# Patient Record
Sex: Female | Born: 2000 | Race: Black or African American | Hispanic: No | Marital: Single | Smoking: Never smoker
Health system: Southern US, Community
[De-identification: ages and names within clinical notes are randomized; demographics above are authoritative.]

## PROBLEM LIST (undated history)

## (undated) ENCOUNTER — Inpatient Hospital Stay: Payer: Self-pay

## (undated) DIAGNOSIS — Z9289 Personal history of other medical treatment: Secondary | ICD-10-CM

## (undated) DIAGNOSIS — Z9109 Other allergy status, other than to drugs and biological substances: Secondary | ICD-10-CM

## (undated) DIAGNOSIS — J45909 Unspecified asthma, uncomplicated: Secondary | ICD-10-CM

## (undated) DIAGNOSIS — B009 Herpesviral infection, unspecified: Secondary | ICD-10-CM

## (undated) DIAGNOSIS — D649 Anemia, unspecified: Secondary | ICD-10-CM

## (undated) DIAGNOSIS — L309 Dermatitis, unspecified: Secondary | ICD-10-CM

## (undated) HISTORY — DX: Personal history of other medical treatment: Z92.89

## (undated) HISTORY — DX: Dermatitis, unspecified: L30.9

## (undated) HISTORY — DX: Other allergy status, other than to drugs and biological substances: Z91.09

## (undated) HISTORY — PX: WISDOM TOOTH EXTRACTION: SHX21

## (undated) HISTORY — DX: Herpesviral infection, unspecified: B00.9

## (undated) HISTORY — DX: Unspecified asthma, uncomplicated: J45.909

---
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
## 2019-08-04 ENCOUNTER — Other Ambulatory Visit: Payer: Self-pay | Admitting: Obstetrics and Gynecology

## 2019-08-04 DIAGNOSIS — Z30011 Encounter for initial prescription of contraceptive pills: Secondary | ICD-10-CM

## 2019-10-11 NOTE — L&D Delivery Note (Signed)
Delivery Summary for Sandy Burns  Labor Events:   Preterm labor: No data found  Rupture date: 09/05/2020  Rupture time: 5:07 PM  Rupture type: Spontaneous Intact  Fluid Color: Clear  Induction: No data found  Augmentation: No data found  Complications: No data found  Cervical ripening: No data found No data found   No data found     Delivery:   Episiotomy: No data found  Lacerations: No data found  Repair suture: No data found  Repair # of packets: No data found  Blood loss (ml): 500   Information for the patient's newborn:  Anacristina, Steffek [[id number]]    Delivery 09/05/2020 6:10 PM by  Vaginal, Spontaneous Sex:  female Gestational Age: [redacted]w[redacted]d Delivery Clinician:   Living?:         APGARS  One minute Five minutes Ten minutes  Skin color:        Heart rate:        Grimace:        Muscle tone:        Breathing:        Totals: 9  9      Presentation/position:      Resuscitation:   Cord information:    Disposition of cord blood:     Blood gases sent?  Complications:   Placenta: Delivered:       appearance Newborn Measurements: Weight: 6 lb 8.8 oz (2970 g)  Height: 20.08"  Head circumference:    Chest circumference:    Other providers:    Additional  information: Forceps:   Vacuum:   Breech:   Observed anomalies      Delivery Note At 6:10 PM a viable and healthy female was delivered via Vaginal, Spontaneous (Presentation: Vertex; Left Occiput Anterior).  APGAR: 9, 9; weight 2970 grams.   Placenta status: Spontaneous, Intact.  Cord: 3 vessels with the following complications: None.  Cord pH: not obtained.  Anesthesia: Epidural;Local 1% lidocaine - 6 ml Episiotomy: None Lacerations: Vaginal Suture Repair: 3.0 vicryl rapide  Est. Blood Loss (mL): 500  Mom to postpartum.  Baby to Couplet care / Skin to Skin.  Hildred Laser, MD 09/05/2020, 6:54 PM

## 2019-10-22 ENCOUNTER — Ambulatory Visit (INDEPENDENT_AMBULATORY_CARE_PROVIDER_SITE_OTHER): Payer: Medicaid Other | Admitting: Obstetrics and Gynecology

## 2019-10-22 ENCOUNTER — Other Ambulatory Visit: Payer: Self-pay

## 2019-10-22 ENCOUNTER — Other Ambulatory Visit (HOSPITAL_COMMUNITY)
Admission: RE | Admit: 2019-10-22 | Discharge: 2019-10-22 | Disposition: A | Discharge status: Home / Self Care | Payer: Medicaid Other | Source: Ambulatory Visit | Attending: Obstetrics and Gynecology | Admitting: Obstetrics and Gynecology

## 2019-10-22 ENCOUNTER — Encounter: Payer: Self-pay | Admitting: Obstetrics and Gynecology

## 2019-10-22 VITALS — BP 113/81 | HR 108 | Ht 67.0 in | Wt 233.6 lb

## 2019-10-22 DIAGNOSIS — Z30011 Encounter for initial prescription of contraceptive pills: Secondary | ICD-10-CM

## 2019-10-22 DIAGNOSIS — Z01419 Encounter for gynecological examination (general) (routine) without abnormal findings: Secondary | ICD-10-CM

## 2019-10-22 DIAGNOSIS — Z0000 Encounter for general adult medical examination without abnormal findings: Secondary | ICD-10-CM | POA: Diagnosis not present

## 2019-10-22 MED ORDER — DESOGESTREL-ETHINYL ESTRADIOL 0.15-0.02/0.01 MG (21/5) PO TABS: 1.0000 | ORAL_TABLET | Freq: Every day | ORAL | 1 refills | Status: DC

## 2019-10-22 NOTE — Addendum Note (Signed)
Addended by: Dorian Pod on: 10/22/2019 08:53 AM   Modules accepted: Orders

## 2019-10-22 NOTE — Progress Notes (Signed)
HPI:      Sandy Burns is a 19 y.o. No obstetric history on file. who LMP was Patient's last menstrual period was 10/11/2019.  Subjective:   She presents today for her annual examination.  She is taking OCPs.  She has normal regular cycles on OCPs and would like to continue them.  She has no complaints.    Hx: The following portions of the patient's history were reviewed and updated as appropriate:             She  has a past medical history of Asthma. She does not have a problem list on file. She  has a past surgical history that includes Foot surgery (2015) and Tooth extraction (2015). Her family history includes Hypertension in her mother; Lupus in her mother; Rheum arthritis in her mother. She  reports that she has never smoked. She has never used smokeless tobacco. She reports that she does not drink alcohol or use drugs. She has a current medication list which includes the following prescription(s): benzaclin, cetirizine, clobetasol, desogestrel-ethinyl estradiol, epipen 2-pak, fluticasone, ketoconazole, metadate cd, pataday, proair hfa, qvar, and tretinoin. She has No Known Allergies.       Review of Systems:  Review of Systems  Constitutional: Denied constitutional symptoms, night sweats, recent illness, fatigue, fever, insomnia and weight loss.  Eyes: Denied eye symptoms, eye pain, photophobia, vision change and visual disturbance.  Ears/Nose/Throat/Neck: Denied ear, nose, throat or neck symptoms, hearing loss, nasal discharge, sinus congestion and sore throat.  Cardiovascular: Denied cardiovascular symptoms, arrhythmia, chest pain/pressure, edema, exercise intolerance, orthopnea and palpitations.  Respiratory: Denied pulmonary symptoms, asthma, pleuritic pain, productive sputum, cough, dyspnea and wheezing.  Gastrointestinal: Denied, gastro-esophageal reflux, melena, nausea and vomiting.  Genitourinary: Denied genitourinary symptoms including symptomatic vaginal discharge,  pelvic relaxation issues, and urinary complaints.  Musculoskeletal: Denied musculoskeletal symptoms, stiffness, swelling, muscle weakness and myalgia.  Dermatologic: Denied dermatology symptoms, rash and scar.  Neurologic: Denied neurology symptoms, dizziness, headache, neck pain and syncope.  Psychiatric: Denied psychiatric symptoms, anxiety and depression.  Endocrine: Denied endocrine symptoms including hot flashes and night sweats.   Meds:   Current Outpatient Medications on File Prior to Visit  Medication Sig Dispense Refill  . BENZACLIN gel   0  . cetirizine (ZYRTEC) 10 MG tablet Take 10 mg by mouth daily.  0  . clobetasol (TEMOVATE) 0.05 % external solution   0  . EPIPEN 2-PAK 0.3 MG/0.3ML SOAJ injection use as directed by prescriber if needed  0  . fluticasone (FLONASE) 50 MCG/ACT nasal spray   0  . ketoconazole (NIZORAL) 2 % shampoo   0  . METADATE CD 20 MG CR capsule Take 20 mg by mouth every morning.  0  . PATADAY 0.2 % SOLN   0  . PROAIR HFA 108 (90 BASE) MCG/ACT inhaler inhale 1 to 2 puffs every 4 to 6 hours if needed for cough if needed  0  . QVAR 40 MCG/ACT inhaler   0  . tretinoin (RETIN-A) 0.025 % cream 1 APPLICATION APPLY ON THE SKIN AT BEDTIME: APPLY A PEA SIZED AMOUNT TO THE ENTIRE FACE  0   No current facility-administered medications on file prior to visit.    Objective:     Vitals:   10/22/19 0810  BP: 113/81  Pulse: (!) 108              Physical examination General NAD, Conversant  HEENT Atraumatic; Op clear with mmm.  Normo-cephalic. Pupils reactive. Anicteric sclerae  Thyroid/Neck Smooth without nodularity or enlargement. Normal ROM.  Neck Supple.  Skin No rashes, lesions or ulceration. Normal palpated skin turgor. No nodularity.  Breasts: No masses or discharge.  Symmetric.  No axillary adenopathy.  Lungs: Clear to auscultation.No rales or wheezes. Normal Respiratory effort, no retractions.  Heart: NSR.  No murmurs or rubs appreciated. No periferal  edema  Abdomen: Soft.  Non-tender.  No masses.  No HSM. No hernia  Extremities: Moves all appropriately.  Normal ROM for age. No lymphadenopathy.  Neuro: Oriented to PPT.  Normal mood. Normal affect.     Pelvic:   Vulva: Normal appearance.  No lesions.  Vagina: No lesions or abnormalities noted.  Support: Normal pelvic support.  Urethra No masses tenderness or scarring.  Meatus Normal size without lesions or prolapse.  Cervix: Normal appearance.  No lesions.  Anus: Normal exam.  No lesions.  Perineum: Normal exam.  No lesions.        Bimanual   Uterus: Normal size.  Non-tender.  Mobile.  AV.  Adnexae: No masses.  Non-tender to palpation.  Cul-de-sac: Negative for abnormality.      Assessment:    No obstetric history on file. There are no problems to display for this patient.    1. Well woman exam with routine gynecological exam   2. Initiation of OCP (BCP)     OCPs without problem.   Plan:            1.  Basic Screening Recommendations The basic screening recommendations for asymptomatic women were discussed with the patient during her visit.  The age-appropriate recommendations were discussed with her and the rational for the tests reviewed.  When I am informed by the patient that another primary care physician has previously obtained the age-appropriate tests and they are up-to-date, only outstanding tests are ordered and referrals given as necessary.  Abnormal results of tests will be discussed with her when all of her results are completed.  Routine preventative health maintenance measures emphasized: Exercise/Diet/Weight control, Tobacco Warnings, Alcohol/Substance use risks and Stress Management GC/CT performed  Continue OCPs Orders No orders of the defined types were placed in this encounter.    Meds ordered this encounter  Medications  . desogestrel-ethinyl estradiol (VIORELE) 0.15-0.02/0.01 MG (21/5) tablet    Sig: Take 1 tablet by mouth at bedtime.     Dispense:  84 tablet    Refill:  1        F/U  Return in about 1 year (around 10/21/2020) for Annual Physical.  Finis Bud, M.D. 10/22/2019 8:33 AM

## 2019-10-23 LAB — CERVICOVAGINAL ANCILLARY ONLY
Bacterial Vaginitis (gardnerella): NEGATIVE
Candida Glabrata: NEGATIVE
Candida Vaginitis: NEGATIVE
Chlamydia: POSITIVE — AB
Comment: NEGATIVE
Comment: NEGATIVE
Comment: NEGATIVE
Comment: NEGATIVE
Comment: NEGATIVE
Comment: NORMAL
Neisseria Gonorrhea: NEGATIVE
Trichomonas: NEGATIVE

## 2019-10-25 ENCOUNTER — Other Ambulatory Visit: Payer: Self-pay

## 2019-10-25 MED ORDER — AZITHROMYCIN 500 MG PO TABS: ORAL_TABLET | ORAL | 1 refills | Status: DC

## 2019-10-25 NOTE — Telephone Encounter (Signed)
Communicable disease report faxed to [facility] and fax confirmation received at 13:31.

## 2019-11-12 ENCOUNTER — Ambulatory Visit (INDEPENDENT_AMBULATORY_CARE_PROVIDER_SITE_OTHER): Payer: Medicaid Other | Admitting: Obstetrics and Gynecology

## 2019-11-12 ENCOUNTER — Encounter: Payer: Self-pay | Admitting: Obstetrics and Gynecology

## 2019-11-12 ENCOUNTER — Other Ambulatory Visit: Payer: Self-pay

## 2019-11-12 VITALS — BP 124/84 | HR 103 | Ht 67.0 in | Wt 228.0 lb

## 2019-11-12 DIAGNOSIS — Z202 Contact with and (suspected) exposure to infections with a predominantly sexual mode of transmission: Secondary | ICD-10-CM | POA: Diagnosis not present

## 2019-11-12 NOTE — Progress Notes (Signed)
Patient comes in today for STD recheck.

## 2019-11-12 NOTE — Progress Notes (Signed)
HPI:      [name] is a 19 y.o. No obstetric history on file. who LMP was Patient's last menstrual period was 11/07/2019.  Subjective:   She presents today after scheduling to be seen for a follow-up of a positive chlamydia.  Patient states that she took the Zithromax on the 17th and that her partner was treated on the same day.  She has not had intercourse since that time.  She reports no symptoms of vaginal discharge pelvic pain etc.    Hx: The following portions of the patient's history were reviewed and updated as appropriate:             She  has a past medical history of Asthma. She does not have a problem list on file. She  has a past surgical history that includes Foot surgery (2015) and Tooth extraction (2015). Her family history includes Hypertension in her mother; Lupus in her mother; Rheum arthritis in her mother. She  reports that she has never smoked. She has never used smokeless tobacco. She reports that she does not drink alcohol or use drugs. She has a current medication list which includes the following prescription(s): benzaclin, cetirizine, clobetasol, desogestrel-ethinyl estradiol, epipen 2-pak, fluticasone, ketoconazole, metadate cd, pataday, proair hfa, qvar, and tretinoin. She has No Known Allergies.       Review of Systems:  Review of Systems  Constitutional: Denied constitutional symptoms, night sweats, recent illness, fatigue, fever, insomnia and weight loss.  Eyes: Denied eye symptoms, eye pain, photophobia, vision change and visual disturbance.  Ears/Nose/Throat/Neck: Denied ear, nose, throat or neck symptoms, hearing loss, nasal discharge, sinus congestion and sore throat.  Cardiovascular: Denied cardiovascular symptoms, arrhythmia, chest pain/pressure, edema, exercise intolerance, orthopnea and palpitations.  Respiratory: Denied pulmonary symptoms, asthma, pleuritic pain, productive sputum, cough, dyspnea and wheezing.  Gastrointestinal: Denied,  gastro-esophageal reflux, melena, nausea and vomiting.  Genitourinary: Denied genitourinary symptoms including symptomatic vaginal discharge, pelvic relaxation issues, and urinary complaints.  Musculoskeletal: Denied musculoskeletal symptoms, stiffness, swelling, muscle weakness and myalgia.  Dermatologic: Denied dermatology symptoms, rash and scar.  Neurologic: Denied neurology symptoms, dizziness, headache, neck pain and syncope.  Psychiatric: Denied psychiatric symptoms, anxiety and depression.  Endocrine: Denied endocrine symptoms including hot flashes and night sweats.   Meds:   Current Outpatient Medications on File Prior to Visit  Medication Sig Dispense Refill  . BENZACLIN gel   0  . cetirizine (ZYRTEC) 10 MG tablet Take 10 mg by mouth daily.  0  . clobetasol (TEMOVATE) 0.05 % external solution   0  . desogestrel-ethinyl estradiol (VIORELE) 0.15-0.02/0.01 MG (21/5) tablet Take 1 tablet by mouth at bedtime. 84 tablet 1  . EPIPEN 2-PAK 0.3 MG/0.3ML SOAJ injection use as directed by prescriber if needed  0  . fluticasone (FLONASE) 50 MCG/ACT nasal spray   0  . ketoconazole (NIZORAL) 2 % shampoo   0  . METADATE CD 20 MG CR capsule Take 20 mg by mouth every morning.  0  . PATADAY 0.2 % SOLN   0  . PROAIR HFA 108 (90 BASE) MCG/ACT inhaler inhale 1 to 2 puffs every 4 to 6 hours if needed for cough if needed  0  . QVAR 40 MCG/ACT inhaler   0  . tretinoin (RETIN-A) 9.371 % cream 1 APPLICATION APPLY ON THE SKIN AT BEDTIME: APPLY A PEA SIZED AMOUNT TO THE ENTIRE FACE  0   No current facility-administered medications on file prior to visit.    Objective:  Vitals:   11/12/19 0949  BP: 124/84  Pulse: (!) 103                Assessment:    No obstetric history on file. There are no problems to display for this patient.    1. Chlamydia contact, treated     As patient not sexually active I am sure that her treatment on the 17th was effective.   Plan:            1.  I  recommended a test of cure approximately 6 weeks after treatment which would be the first week of March.  I have offered to test her again today if she desired but she declined as she has not had sexual contact since her last treatment.  2.  Patient taking OCPs as directed. Orders No orders of the defined types were placed in this encounter.   No orders of the defined types were placed in this encounter.     F/U  Return in about 1 month (around 12/10/2019). I spent 12 minutes involved in the care of this patient preparing to see the patient by obtaining and reviewing her medical history (including labs, imaging tests and prior procedures), documenting clinical information in the electronic health record (EHR), counseling and coordinating care plans, writing and sending prescriptions, ordering tests or procedures and directly communicating with the patient by discussing pertinent items from her history and physical exam as well as detailing my assessment and plan as noted above so that she has an informed understanding.  All of her questions were answered.  Elonda Husky, M.D. 11/12/2019 10:21 AM

## 2020-01-14 ENCOUNTER — Encounter: Payer: Self-pay | Admitting: Obstetrics and Gynecology

## 2020-01-14 ENCOUNTER — Other Ambulatory Visit: Payer: Self-pay

## 2020-01-14 ENCOUNTER — Ambulatory Visit (INDEPENDENT_AMBULATORY_CARE_PROVIDER_SITE_OTHER): Payer: Medicaid Other | Admitting: Obstetrics and Gynecology

## 2020-01-14 VITALS — BP 138/77 | HR 132 | Ht 67.0 in | Wt 236.0 lb

## 2020-01-14 DIAGNOSIS — A749 Chlamydial infection, unspecified: Secondary | ICD-10-CM | POA: Diagnosis not present

## 2020-01-14 DIAGNOSIS — N912 Amenorrhea, unspecified: Secondary | ICD-10-CM | POA: Diagnosis not present

## 2020-01-14 LAB — POCT URINE PREGNANCY: Preg Test, Ur: POSITIVE — AB

## 2020-01-14 NOTE — Progress Notes (Signed)
HPI:      [name] is a 19 y.o. G2P0010 who LMP was Patient's last menstrual period was 12/11/2019.  Subjective:   She presents today for follow-up test of cure for chlamydia.  She believes that her partner was treated and has been faithful to her but she is not sure. She "forgot" to take her OCPs and has now missed a menstrual period she has done a pregnancy test at home and it was positive. She is not yet taking prenatal vitamins. She is not sure if the father will be involved.  When I asked her she said "I blocked him on social media". Reports no problem with nausea vomiting or breast tenderness.    Hx: The following portions of the patient's history were reviewed and updated as appropriate:             She  has a past medical history of Asthma. She does not have a problem list on file. She  has a past surgical history that includes Foot surgery (2015) and Tooth extraction (2015). Her family history includes Hypertension in her mother; Lupus in her mother; Rheum arthritis in her mother. She  reports that she has never smoked. She has never used smokeless tobacco. She reports that she does not drink alcohol or use drugs. She has a current medication list which includes the following prescription(s): benzaclin, cetirizine, clobetasol, epipen 2-pak, fluticasone, ketoconazole, pataday, proair hfa, tretinoin, advair diskus, desogestrel-ethinyl estradiol, metadate cd, and qvar. She has No Known Allergies.       Review of Systems:  Review of Systems  Constitutional: Denied constitutional symptoms, night sweats, recent illness, fatigue, fever, insomnia and weight loss.  Eyes: Denied eye symptoms, eye pain, photophobia, vision change and visual disturbance.  Ears/Nose/Throat/Neck: Denied ear, nose, throat or neck symptoms, hearing loss, nasal discharge, sinus congestion and sore throat.  Cardiovascular: Denied cardiovascular symptoms, arrhythmia, chest pain/pressure, edema, exercise  intolerance, orthopnea and palpitations.  Respiratory: Denied pulmonary symptoms, asthma, pleuritic pain, productive sputum, cough, dyspnea and wheezing.  Gastrointestinal: Denied, gastro-esophageal reflux, melena, nausea and vomiting.  Genitourinary: Denied genitourinary symptoms including symptomatic vaginal discharge, pelvic relaxation issues, and urinary complaints.  Musculoskeletal: Denied musculoskeletal symptoms, stiffness, swelling, muscle weakness and myalgia.  Dermatologic: Denied dermatology symptoms, rash and scar.  Neurologic: Denied neurology symptoms, dizziness, headache, neck pain and syncope.  Psychiatric: Denied psychiatric symptoms, anxiety and depression.  Endocrine: Denied endocrine symptoms including hot flashes and night sweats.   Meds:   Current Outpatient Medications on File Prior to Visit  Medication Sig Dispense Refill  . BENZACLIN gel   0  . cetirizine (ZYRTEC) 10 MG tablet Take 10 mg by mouth daily.  0  . clobetasol (TEMOVATE) 0.05 % external solution   0  . EPIPEN 2-PAK 0.3 MG/0.3ML SOAJ injection use as directed by prescriber if needed  0  . fluticasone (FLONASE) 50 MCG/ACT nasal spray   0  . ketoconazole (NIZORAL) 2 % shampoo   0  . PATADAY 0.2 % SOLN   0  . PROAIR HFA 108 (90 BASE) MCG/ACT inhaler inhale 1 to 2 puffs every 4 to 6 hours if needed for cough if needed  0  . tretinoin (RETIN-A) 0.025 % cream 1 APPLICATION APPLY ON THE SKIN AT BEDTIME: APPLY A PEA SIZED AMOUNT TO THE ENTIRE FACE  0  . ADVAIR DISKUS 100-50 MCG/DOSE AEPB 1 puff 2 (two) times daily.    Marland Kitchen desogestrel-ethinyl estradiol (VIORELE) 0.15-0.02/0.01 MG (21/5) tablet Take 1 tablet by  mouth at bedtime. (Patient not taking: Reported on 01/14/2020) 84 tablet 1  . METADATE CD 20 MG CR capsule Take 20 mg by mouth every morning.  0  . QVAR 40 MCG/ACT inhaler   0   No current facility-administered medications on file prior to visit.    Objective:     Vitals:   01/14/20 0945  BP: 138/77   Pulse: (!) 132              Urinary pregnancy test positive  Assessment:    G2P0010 There are no problems to display for this patient.    1. Amenorrhea   2. Chlamydia     Estimated gestational age [redacted] weeks  Patient treated appropriate for chlamydia but she is unsure of her partner.   Plan:            Prenatal Plan 1.  The patient was given prenatal literature. 2.  She was started on prenatal vitamins. 3.  A prenatal lab panel was ordered or drawn. 4.  An ultrasound was ordered to better determine an EDC. 5.  A nurse visit was scheduled. 6.  Genetic testing and testing for other inheritable conditions discussed in detail. She will decide in the future whether to have these labs performed. 7.  A general overview of pregnancy testing, visit schedule, ultrasound schedule, and prenatal care was discussed. 8.  COVID and its risks associated with pregnancy, prevention by limiting exposure and use of masks, as well as the risks and benefits of vaccination during pregnancy were discussed in detail.  [facility] policy regarding office and hospital visitation and testing was explained. 9.  Benefits of breast-feeding discussed in detail including both maternal and infant benefits. Ready Set Baby website discussed. 10.  Urine sent for GC/CT test of cure.   Orders Orders Placed This Encounter  Procedures  . US OB Comp Less 14 Wks  . POCT urine pregnancy    No orders of the defined types were placed in this encounter.     F/U  Return in about 7 weeks (around 03/03/2020). I spent 31 minutes involved in the care of this patient preparing to see the patient by obtaining and reviewing her medical history (including labs, imaging tests and prior procedures), documenting clinical information in the electronic health record (EHR), counseling and coordinating care plans, writing and sending prescriptions, ordering tests or procedures and directly communicating with the patient by discussing pertinent  items from her history and physical exam as well as detailing my assessment and plan as noted above so that she has an informed understanding.  All of her questions were answered.  Finis Bud, M.D. 01/14/2020 10:18 AM

## 2020-01-24 ENCOUNTER — Other Ambulatory Visit: Payer: Self-pay | Admitting: Surgical

## 2020-01-24 MED ORDER — VITAFOL ULTRA 29-0.6-0.4-200 MG PO CAPS: 1.0000 | ORAL_CAPSULE | Freq: Every day | ORAL | 11 refills | Status: DC

## 2020-01-29 ENCOUNTER — Ambulatory Visit: Payer: Medicaid Other

## 2020-01-29 ENCOUNTER — Other Ambulatory Visit: Payer: Self-pay

## 2020-02-03 NOTE — Telephone Encounter (Signed)
pts mom called in and stated that her daughter got a message on mychart stated that she no showed for the appt on the 4/21. The pt was considered bc she didn't have to be charged a no show fee. I told the mom our no show process. I told the mom that she was seen and that was an error on [place]

## 2020-02-13 ENCOUNTER — Other Ambulatory Visit: Payer: Self-pay

## 2020-02-13 ENCOUNTER — Ambulatory Visit (INDEPENDENT_AMBULATORY_CARE_PROVIDER_SITE_OTHER): Payer: Medicaid Other

## 2020-02-13 DIAGNOSIS — Z3A09 9 weeks gestation of pregnancy: Secondary | ICD-10-CM

## 2020-02-13 DIAGNOSIS — N912 Amenorrhea, unspecified: Secondary | ICD-10-CM

## 2020-02-13 NOTE — Progress Notes (Signed)
      Sandy Burns presents for NOB nurse intake visit. Pregnancy confirmation done at [facility] , 01/14/2020, with Linzie Collin, MD.  G 1.  P 0.  LMP 12/11/19.  EDD 09/16/2020.  [place] [redacted]w[redacted]d. Pregnancy education material explained and given.  0 cats in the home.  NOB labs ordered. BMI greater than 30. TSH/HbgA1c ordered. Sickle cell order due to race. HIV and drug screen explained and ordered. Genetic screening discussed. Genetic testing; Unsure. Pt to discuss genetic testing with provider. PNV encouraged. Pt to follow up with provider in 3 weeks for NOB physical. FMLA and [facility] Financial Policy reviewed and signed by pt.

## 2020-02-14 ENCOUNTER — Ambulatory Visit (INDEPENDENT_AMBULATORY_CARE_PROVIDER_SITE_OTHER): Payer: Medicaid Other

## 2020-02-14 VITALS — BP 112/76 | HR 106 | Ht 67.0 in | Wt 238.6 lb

## 2020-02-14 DIAGNOSIS — R638 Other symptoms and signs concerning food and fluid intake: Secondary | ICD-10-CM

## 2020-02-14 DIAGNOSIS — Z3481 Encounter for supervision of other normal pregnancy, first trimester: Secondary | ICD-10-CM | POA: Diagnosis not present

## 2020-02-14 DIAGNOSIS — Z0283 Encounter for blood-alcohol and blood-drug test: Secondary | ICD-10-CM

## 2020-02-14 DIAGNOSIS — Z3A09 9 weeks gestation of pregnancy: Secondary | ICD-10-CM

## 2020-02-14 DIAGNOSIS — Z113 Encounter for screening for infections with a predominantly sexual mode of transmission: Secondary | ICD-10-CM

## 2020-02-14 LAB — OB RESULTS CONSOLE VARICELLA ZOSTER ANTIBODY, IGG: Varicella: NON-IMMUNE/NOT IMMUNE

## 2020-02-14 NOTE — Patient Instructions (Signed)
WHAT OB PATIENTS CAN EXPECT   Confirmation of pregnancy and ultrasound ordered if medically indicated-[redacted] weeks gestation  New OB (NOB) intake with nurse and New OB (NOB) labs- [redacted] weeks gestation  New OB (NOB) physical examination with provider- 11/[redacted] weeks gestation  Flu vaccine-[redacted] weeks gestation  Anatomy scan-[redacted] weeks gestation  Glucose tolerance test, blood work to test for anemia, T-dap vaccine-[redacted] weeks gestation  Vaginal swabs/cultures-STD/Group B strep-[redacted] weeks gestation  Appointments every 4 weeks until 28 weeks  Every 2 weeks from 28 weeks until 36 weeks  Weekly visits from 36 weeks until delivery  Morning Sickness  Morning sickness is when you feel sick to your stomach (nauseous) during pregnancy. You may feel sick to your stomach and throw up (vomit). You may feel sick in the morning, but you can feel this way at any time of day. Some women feel very sick to their stomach and cannot stop throwing up (hyperemesis gravidarum). Follow these instructions at home: Medicines  Take over-the-counter and prescription medicines only as told by your doctor. Do not take any medicines until you talk with your doctor about them first.  Taking multivitamins before getting pregnant can stop or lessen the harshness of morning sickness. Eating and drinking  Eat dry toast or crackers before getting out of bed.  Eat 5 or 6 small meals a day.  Eat dry and bland foods like rice and baked potatoes.  Do not eat greasy, fatty, or spicy foods.  Have someone cook for you if the smell of food causes you to feel sick or throw up.  If you feel sick to your stomach after taking prenatal vitamins, take them at night or with a snack.  Eat protein when you need a snack. Nuts, yogurt, and cheese are good choices.  Drink fluids throughout the day.  Try ginger ale made with real ginger, ginger tea made from fresh grated ginger, or ginger candies. General instructions  Do not use any products  that have nicotine or tobacco in them, such as cigarettes and e-cigarettes. If you need help quitting, ask your doctor.  Use an air purifier to keep the air in your house free of smells.  Get lots of fresh air.  Try to avoid smells that make you feel sick.  Try: ? Wearing a bracelet that is used for seasickness (acupressure wristband). ? Going to a doctor who puts thin needles into certain body points (acupuncture) to improve how you feel. Contact a doctor if:  You need medicine to feel better.  You feel dizzy or light-headed.  You are losing weight. Get help right away if:  You feel very sick to your stomach and cannot stop throwing up.  You pass out (faint).  You have very bad pain in your belly. Summary  Morning sickness is when you feel sick to your stomach (nauseous) during pregnancy.  You may feel sick in the morning, but you can feel this way at any time of day.  Making some changes to what you eat may help your symptoms go away. This information is not intended to replace advice given to you by your health care provider. Make sure you discuss any questions you have with your health care provider. Document Revised: 09/08/2017 Document Reviewed: 10/27/2016 Elsevier Patient Education  2020 [employer]. How a Baby Grows During Pregnancy  Pregnancy begins when a female's sperm enters a female's egg (fertilization). Fertilization usually happens in one of the tubes (fallopian tubes) that connect the ovaries to the  womb (uterus). The fertilized egg moves down the fallopian tube to the uterus. Once it reaches the uterus, it implants into the lining of the uterus and begins to grow. For the first 10 weeks, the fertilized egg is called an embryo. After 10 weeks, it is called a fetus. As the fetus continues to grow, it receives oxygen and nutrients through tissue (placenta) that grows to support the developing baby. The placenta is the life support system for the baby. It provides  oxygen and nutrition and removes waste. Learning as much as you can about your pregnancy and how your baby is developing can help you enjoy the experience. It can also make you aware of when there might be a problem and when to ask questions. How long does a typical pregnancy last? A pregnancy usually lasts 280 days, or about 40 weeks. Pregnancy is divided into three periods of growth, also called trimesters:  First trimester: 0-12 weeks.  Second trimester: 13-27 weeks.  Third trimester: 28-40 weeks. The day when your baby is ready to be born (full term) is your estimated date of delivery. How does my baby develop month by month? First month  The fertilized egg attaches to the inside of the uterus.  Some cells will form the placenta. Others will form the fetus.  The arms, legs, brain, spinal cord, lungs, and heart begin to develop.  At the end of the first month, the heart begins to beat. Second month  The bones, inner ear, eyelids, hands, and feet form.  The genitals develop.  By the end of 8 weeks, all major organs are developing. Third month  All of the internal organs are forming.  Teeth develop below the gums.  Bones and muscles begin to grow. The spine can flex.  The skin is transparent.  Fingernails and toenails begin to form.  Arms and legs continue to grow longer, and hands and feet develop.  The fetus is about 3 inches (7.6 cm) long. Fourth month  The placenta is completely formed.  The external sex organs, neck, outer ear, eyebrows, eyelids, and fingernails are formed.  The fetus can hear, swallow, and move its arms and legs.  The kidneys begin to produce urine.  The skin is covered with a white, waxy coating (vernix) and very fine hair (lanugo). Fifth month  The fetus moves around more and can be felt for the first time (quickening).  The fetus starts to sleep and wake up and may begin to suck its finger.  The nails grow to the end of the  fingers.  The organ in the digestive system that makes bile (gallbladder) functions and helps to digest nutrients.  If your baby is a girl, eggs are present in her ovaries. If your baby is a boy, testicles start to move down into his scrotum. Sixth month  The lungs are formed.  The eyes open. The brain continues to develop.  Your baby has fingerprints and toe prints. Your baby's hair grows thicker.  At the end of the second trimester, the fetus is about 9 inches (22.9 cm) long. Seventh month  The fetus kicks and stretches.  The eyes are developed enough to sense changes in light.  The hands can make a grasping motion.  The fetus responds to sound. Eighth month  All organs and body systems are fully developed and functioning.  Bones harden, and taste buds develop. The fetus may hiccup.  Certain areas of the brain are still developing. The skull remains soft.   Ninth month  The fetus gains about  lb (0.23 kg) each week.  The lungs are fully developed.  Patterns of sleep develop.  The fetus's head typically moves into a head-down position (vertex) in the uterus to prepare for birth.  The fetus weighs 6-9 lb (2.72-4.08 kg) and is 19-20 inches (48.26-50.8 cm) long. What can I do to have a healthy pregnancy and help my baby develop? General instructions  Take prenatal vitamins as directed by your health care provider. These include vitamins such as folic acid, iron, calcium, and vitamin D. They are important for healthy development.  Take medicines only as directed by your health care provider. Read labels and ask a pharmacist or your health care provider whether over-the-counter medicines, supplements, and prescription drugs are safe to take during pregnancy.  Keep all follow-up visits as directed by your health care provider. This is important. Follow-up visits include prenatal care and screening tests. How do I know if my baby is developing well? At each prenatal visit,  your health care provider will do several different tests to check on your health and keep track of your baby's development. These include:  Fundal height and position. ? Your health care provider will measure your growing belly from your pubic bone to the top of the uterus using a tape measure. ? Your health care provider will also feel your belly to determine your baby's position.  Heartbeat. ? An ultrasound in the first trimester can confirm pregnancy and show a heartbeat, depending on how far along you are. ? Your health care provider will check your baby's heart rate at every prenatal visit.  Second trimester ultrasound. ? This ultrasound checks your baby's development. It also may show your baby's gender. What should I do if I have concerns about my baby's development? Always talk with your health care provider about any concerns that you may have about your pregnancy and your baby. Summary  A pregnancy usually lasts 280 days, or about 40 weeks. Pregnancy is divided into three periods of growth, also called trimesters.  Your health care provider will monitor your baby's growth and development throughout your pregnancy.  Follow your health care provider's recommendations about taking prenatal vitamins and medicines during your pregnancy.  Talk with your health care provider if you have any concerns about your pregnancy or your developing baby. This information is not intended to replace advice given to you by your health care provider. Make sure you discuss any questions you have with your health care provider. Document Revised: 01/17/2019 Document Reviewed: 08/09/2017 Elsevier Patient Education  2020 [place] of Pregnancy  The first trimester of pregnancy is from week 1 until the end of week 13 (months 1 through 3). During this time, your baby will begin to develop inside you. At 6-8 weeks, the eyes and face are formed, and the heartbeat can be seen on  ultrasound. At the end of 12 weeks, all the baby's organs are formed. Prenatal care is all the medical care you receive before the birth of your baby. Make sure you get good prenatal care and follow all of your doctor's instructions. Follow these instructions at home: Medicines  Take over-the-counter and prescription medicines only as told by your doctor. Some medicines are safe and some medicines are not safe during pregnancy.  Take a prenatal vitamin that contains at least 600 micrograms (mcg) of folic acid.  If you have trouble pooping (constipation), take medicine that will make your stool soft (stool  softener) if your doctor approves. Eating and drinking   Eat regular, healthy meals.  Your doctor will tell you the amount of weight gain that is right for you.  Avoid raw meat and uncooked cheese.  If you feel sick to your stomach (nauseous) or throw up (vomit): ? Eat 4 or 5 small meals a day instead of 3 large meals. ? Try eating a few soda crackers. ? Drink liquids between meals instead of during meals.  To prevent constipation: ? Eat foods that are high in fiber, like fresh fruits and vegetables, whole grains, and beans. ? Drink enough fluids to keep your pee (urine) clear or pale yellow. Activity  Exercise only as told by your doctor. Stop exercising if you have cramps or pain in your lower belly (abdomen) or low back.  Do not exercise if it is too hot, too humid, or if you are in a place of great height (high altitude).  Try to avoid standing for long periods of time. Move your legs often if you must stand in one place for a long time.  Avoid heavy lifting.  Wear low-heeled shoes. Sit and stand up straight.  You can have sex unless your doctor tells you not to. Relieving pain and discomfort  Wear a good support bra if your breasts are sore.  Take warm water baths (sitz baths) to soothe pain or discomfort caused by hemorrhoids. Use hemorrhoid cream if your doctor says  it is okay.  Rest with your legs raised if you have leg cramps or low back pain.  If you have puffy, bulging veins (varicose veins) in your legs: ? Wear support hose or compression stockings as told by your doctor. ? Raise (elevate) your feet for 15 minutes, 3-4 times a day. ? Limit salt in your food. Prenatal care  Schedule your prenatal visits by the twelfth week of pregnancy.  Write down your questions. Take them to your prenatal visits.  Keep all your prenatal visits as told by your doctor. This is important. Safety  Wear your seat belt at all times when driving.  Make a list of emergency phone numbers. The list should include numbers for family, friends, the hospital, and police and fire departments. General instructions  Ask your doctor for a referral to a local prenatal class. Begin classes no later than at the start of month 6 of your pregnancy.  Ask for help if you need counseling or if you need help with nutrition. Your doctor can give you advice or tell you where to go for help.  Do not use hot tubs, steam rooms, or saunas.  Do not douche or use tampons or scented sanitary pads.  Do not cross your legs for long periods of time.  Avoid all herbs and alcohol. Avoid drugs that are not approved by your doctor.  Do not use any tobacco products, including cigarettes, chewing tobacco, and electronic cigarettes. If you need help quitting, ask your doctor. You may get counseling or other support to help you quit.  Avoid cat litter boxes and soil used by cats. These carry germs that can cause birth defects in the baby and can cause a loss of your baby (miscarriage) or stillbirth.  Visit your dentist. At home, brush your teeth with a soft toothbrush. Be gentle when you floss. Contact a doctor if:  You are dizzy.  You have mild cramps or pressure in your lower belly.  You have a nagging pain in your belly area.  You  continue to feel sick to your stomach, you throw up, or  you have watery poop (diarrhea).  You have a bad smelling fluid coming from your vagina.  You have pain when you pee (urinate).  You have increased puffiness (swelling) in your face, hands, legs, or ankles. Get help right away if:  You have a fever.  You are leaking fluid from your vagina.  You have spotting or bleeding from your vagina.  You have very bad belly cramping or pain.  You gain or lose weight rapidly.  You throw up blood. It may look like coffee grounds.  You are around people who have [place] measles, fifth disease, or chickenpox.  You have a very bad headache.  You have shortness of breath.  You have any kind of trauma, such as from a fall or a car accident. Summary  The first trimester of pregnancy is from week 1 until the end of week 13 (months 1 through 3).  To take care of yourself and your unborn baby, you will need to eat healthy meals, take medicines only if your doctor tells you to do so, and do activities that are safe for you and your baby.  Keep all follow-up visits as told by your doctor. This is important as your doctor will have to ensure that your baby is healthy and growing well. This information is not intended to replace advice given to you by your health care provider. Make sure you discuss any questions you have with your health care provider. Document Revised: 01/17/2019 Document Reviewed: 10/04/2016 Elsevier Patient Education  2020 [employer]. Commonly Asked Questions During Pregnancy  Cats: A parasite can be excreted in cat feces.  To avoid exposure you need to have another person empty the little box.  If you must empty the litter box you will need to wear gloves.  Wash your hands after handling your cat.  This parasite can also be found in raw or undercooked meat so this should also be avoided.  Colds, Sore Throats, Flu: Please check your medication sheet to see what you can take for symptoms.  If your symptoms are unrelieved by these  medications please call the office.  Dental Work: Most any dental work [employer] recommends is permitted.  X-rays should only be taken during the first trimester if absolutely necessary.  Your abdomen should be shielded with a lead apron during all x-rays.  Please notify your provider prior to receiving any x-rays.  Novocaine is fine; gas is not recommended.  If your dentist requires a note from [place] prior to dental work please call the office and we will provide one for you.  Exercise: Exercise is an important part of staying healthy during your pregnancy.  You may continue most exercises you were accustomed to prior to pregnancy.  Later in your pregnancy you will most likely notice you have difficulty with activities requiring balance like riding a bicycle.  It is important that you listen to your body and avoid activities that put you at a higher risk of falling.  Adequate rest and staying well hydrated are a must!  If you have questions about the safety of specific activities ask your provider.    Exposure to Children with illness: Try to avoid obvious exposure; report any symptoms to [place] when noted,  If you have chicken pos, red measles or mumps, you should be immune to these diseases.   Please do not take any vaccines while pregnant unless you have checked  with your OB provider.  Fetal Movement: After 28 weeks we recommend you do "kick counts" twice daily.  Lie or sit down in a calm quiet environment and count your baby movements "kicks".  You should feel your baby at least 10 times per hour.  If you have not felt 10 kicks within the first hour get up, walk around and have something sweet to eat or drink then repeat for an additional hour.  If count remains less than 10 per hour notify your provider.  Fumigating: Follow your pest control agent's advice as to how long to stay out of your home.  Ventilate the area well before re-entering.  Hemorrhoids:   Most over-the-counter preparations can be used  during pregnancy.  Check your medication to see what is safe to use.  It is important to use a stool softener or fiber in your diet and to drink lots of liquids.  If hemorrhoids seem to be getting worse please call the office.   Hot Tubs:  Hot tubs Jacuzzis and saunas are not recommended while pregnant.  These increase your internal body temperature and should be avoided.  Intercourse:  Sexual intercourse is safe during pregnancy as long as you are comfortable, unless otherwise advised by your provider.  Spotting may occur after intercourse; report any bright red bleeding that is heavier than spotting.  Labor:  If you know that you are in labor, please go to the hospital.  If you are unsure, please call the office and let us help you decide what to do.  Lifting, straining, etc:  If your job requires heavy lifting or straining please check with your provider for any limitations.  Generally, you should not lift items heavier than that you can lift simply with your hands and arms (no back muscles)  Painting:  Paint fumes do not harm your pregnancy, but may make you ill and should be avoided if possible.  Latex or water based paints have less odor than oils.  Use adequate ventilation while painting.  Permanents & Hair Color:  Chemicals in hair dyes are not recommended as they cause increase hair dryness which can increase hair loss during pregnancy.  " Highlighting" and permanents are allowed.  Dye may be absorbed differently and permanents may not hold as well during pregnancy.  Sunbathing:  Use a sunscreen, as skin burns easily during pregnancy.  Drink plenty of fluids; avoid over heating.  Tanning Beds:  Because their possible side effects are still unknown, tanning beds are not recommended.  Ultrasound Scans:  Routine ultrasounds are performed at approximately 20 weeks.  You will be able to see your baby's general anatomy an if you would like to know the gender this can usually be determined as  well.  If it is questionable when you conceived you may also receive an ultrasound early in your pregnancy for dating purposes.  Otherwise ultrasound exams are not routinely performed unless there is a medical necessity.  Although you can request a scan we ask that you pay for it when conducted because insurance does not cover " patient request" scans.  Work: If your pregnancy proceeds without complications you may work until your due date, unless your physician or employer advises otherwise.  Round Ligament Pain/Pelvic Discomfort:  Sharp, shooting pains not associated with bleeding are fairly common, usually occurring in the second trimester of pregnancy.  They tend to be worse when standing up or when you remain standing for long periods of time.  These are  the result of pressure of certain pelvic ligaments called "round ligaments".  Rest, Tylenol and heat seem to be the most effective relief.  As the womb and fetus grow, they rise out of the pelvis and the discomfort improves.  Please notify the office if your pain seems different than that described.  It may represent a more serious condition.  Common Medications Safe in Pregnancy  Acne:      Constipation:  Benzoyl Peroxide     Colace  Clindamycin      Dulcolax Suppository  Topica Erythromycin     Fibercon  Salicylic Acid      Metamucil         Miralax AVOID:        Senakot   Accutane    Cough:  Retin-A       Cough Drops  Tetracycline      Phenergan w/ Codeine if Rx  Minocycline      Robitussin (Plain & DM)  Antibiotics:     Crabs/Lice:  Ceclor       RID  Cephalosporins    AVOID:  E-Mycins      Kwell  Keflex  Macrobid/Macrodantin   Diarrhea:  Penicillin      Kao-Pectate  Zithromax      Imodium AD         PUSH FLUIDS AVOID:       Cipro     Fever:  Tetracycline      Tylenol (Regular or Extra  Minocycline       Strength)  Levaquin      Extra Strength-Do not          Exceed 8 tabs/24 hrs Caffeine:        <294m/day (equiv. To 1 cup  of coffee or  approx. 3 12 oz sodas)         Gas: Cold/Hayfever:       Gas-X  Benadryl      Mylicon  Claritin       Phazyme  **Claritin-D        Chlor-Trimeton    Headaches:  Dimetapp      ASA-Free Excedrin  Drixoral-Non-Drowsy     Cold Compress  Mucinex (Guaifenasin)     Tylenol (Regular or Extra  Sudafed/Sudafed-12 Hour     Strength)  **Sudafed PE Pseudoephedrine   Tylenol Cold & Sinus     Vicks Vapor Rub  Zyrtec  **AVOID if Problems With Blood Pressure         Heartburn: Avoid lying down for at least 1 hour after meals  Aciphex      Maalox     Rash:  Milk of Magnesia     Benadryl    Mylanta       1% Hydrocortisone Cream  Pepcid  Pepcid Complete   Sleep Aids:  Prevacid      Ambien   Prilosec       Benadryl  Rolaids       Chamomile Tea  Tums (Limit 4/day)     Unisom  Zantac       Tylenol PM         Warm milk-add vanilla or  Hemorrhoids:       Sugar for taste  Anusol/Anusol H.C.  (RX: Analapram 2.5%)  Sugar Substitutes:  Hydrocortisone OTC     Ok in moderation  Preparation H      Tucks        Vaseline lotion applied to tissue with wiping    Herpes:  Throat:  Acyclovir      Oragel  Famvir  Valtrex     Vaccines:         Flu Shot Leg Cramps:       *Gardasil  Benadryl      Hepatitis A         Hepatitis B Nasal Spray:       Pneumovax  Saline Nasal Spray     Polio Booster         Tetanus Nausea:       Tuberculosis test or PPD  Vitamin B6 25 mg TID   AVOID:    Dramamine      *Gardasil  Emetrol       Live Poliovirus  Ginger Root 250 mg QID    MMR (measles, mumps &  High Complex Carbs @ Bedtime    rebella)  Sea Bands-Accupressure    Varicella (Chickenpox)  Unisom 1/2 tab TID     *No known complications           If received before Pain:         Known pregnancy;   Darvocet       Resume series after  Lortab        Delivery  Percocet    Yeast:   Tramadol      Femstat  Tylenol  3      Gyne-lotrimin  Ultram       Monistat  Vicodin           MISC:         All Sunscreens           Hair Coloring/highlights          Insect Repellant's          (Including DEET)         Mystic Tans

## 2020-02-15 LAB — URINALYSIS, ROUTINE W REFLEX MICROSCOPIC
Bilirubin, UA: NEGATIVE
Glucose, UA: NEGATIVE
Ketones, UA: NEGATIVE
Leukocytes,UA: NEGATIVE
Nitrite, UA: NEGATIVE
Protein,UA: NEGATIVE
RBC, UA: NEGATIVE
Specific Gravity, UA: 1.02 (ref 1.005–1.030)
Urobilinogen, Ur: 0.2 mg/dL (ref 0.2–1.0)
pH, UA: 6.5 (ref 5.0–7.5)

## 2020-02-15 LAB — DRUG PROFILE, UR, 9 DRUGS (LABCORP)
Amphetamines, Urine: NEGATIVE ng/mL
Barbiturate Quant, Ur: NEGATIVE ng/mL
Benzodiazepine Quant, Ur: NEGATIVE ng/mL
Cannabinoid Quant, Ur: NEGATIVE ng/mL
Cocaine (Metab.): NEGATIVE ng/mL
Methadone Screen, Urine: NEGATIVE ng/mL
Opiate Quant, Ur: NEGATIVE ng/mL
PCP Quant, Ur: NEGATIVE ng/mL
Propoxyphene: NEGATIVE ng/mL

## 2020-02-15 LAB — NICOTINE SCREEN, URINE: Cotinine Ql Scrn, Ur: NEGATIVE ng/mL

## 2020-02-16 LAB — ABO AND RH: Rh Factor: POSITIVE

## 2020-02-16 LAB — TOXOPLASMA ANTIBODIES- IGG AND  IGM
Toxoplasma Antibody- IgM: <3 AU/mL (ref 0.0–7.9)
Toxoplasma IgG Ratio: <3 IU/mL (ref 0.0–7.1)

## 2020-02-16 LAB — HIV ANTIBODY (ROUTINE TESTING W REFLEX): HIV Screen 4th Generation wRfx: NONREACTIVE

## 2020-02-16 LAB — URINE CULTURE, OB REFLEX

## 2020-02-16 LAB — HGB SOLU + RFLX FRAC: Sickle Solubility Test - HGBRFX: NEGATIVE

## 2020-02-16 LAB — CULTURE, OB URINE

## 2020-02-16 LAB — RPR: RPR Ser Ql: NONREACTIVE

## 2020-02-16 LAB — HEPATITIS B SURFACE ANTIGEN: Hepatitis B Surface Ag: NEGATIVE

## 2020-02-16 LAB — TSH: TSH: 0.991 u[IU]/mL (ref 0.450–4.500)

## 2020-02-16 LAB — ANTIBODY SCREEN: Antibody Screen: NEGATIVE

## 2020-02-16 LAB — RUBELLA SCREEN: Rubella Antibodies, IGG: 2.07 index (ref >0.99)

## 2020-02-16 LAB — HEMOGLOBIN A1C
Est. average glucose Bld gHb Est-mCnc: 117 mg/dL
Hgb A1c MFr Bld: 5.7 % — ABNORMAL HIGH (ref 4.8–5.6)

## 2020-02-16 LAB — VARICELLA ZOSTER ANTIBODY, IGG: Varicella zoster IgG: <135 index — ABNORMAL LOW (ref >165)

## 2020-02-17 LAB — GC/CHLAMYDIA PROBE AMP
Chlamydia trachomatis, NAA: NEGATIVE
Neisseria Gonorrhoeae by PCR: NEGATIVE

## 2020-03-03 ENCOUNTER — Ambulatory Visit (INDEPENDENT_AMBULATORY_CARE_PROVIDER_SITE_OTHER): Payer: Medicaid Other | Admitting: Obstetrics and Gynecology

## 2020-03-03 ENCOUNTER — Encounter: Payer: Self-pay | Admitting: Obstetrics and Gynecology

## 2020-03-03 ENCOUNTER — Other Ambulatory Visit: Payer: Self-pay

## 2020-03-03 VITALS — BP 119/84 | HR 114 | Wt 233.8 lb

## 2020-03-03 DIAGNOSIS — Z3A11 11 weeks gestation of pregnancy: Secondary | ICD-10-CM

## 2020-03-03 DIAGNOSIS — Z3491 Encounter for supervision of normal pregnancy, unspecified, first trimester: Secondary | ICD-10-CM

## 2020-03-03 NOTE — Progress Notes (Signed)
NOB: No complaints.  Occasional nausea but without vomiting.  Occasional breast tenderness that "comes and goes."  Patient desires genetic testing.  Consider AFP next visit as well.  Early 1 hour GCT.  Physical examination General NAD, Conversant  HEENT Atraumatic; Op clear with mmm.  Normo-cephalic. Pupils reactive. Anicteric sclerae  Thyroid/Neck Smooth without nodularity or enlargement. Normal ROM.  Neck Supple.  Skin No rashes, lesions or ulceration. Normal palpated skin turgor. No nodularity.  Breasts: No masses or discharge.  Symmetric.  No axillary adenopathy.  Lungs: Clear to auscultation.No rales or wheezes. Normal Respiratory effort, no retractions.  Heart: NSR.  No murmurs or rubs appreciated. No periferal edema  Abdomen: Soft.  Non-tender.  No masses.  No HSM. No hernia  Extremities: Moves all appropriately.  Normal ROM for age. No lymphadenopathy.  Neuro: Oriented to PPT.  Normal mood. Normal affect.     Pelvic:   Vulva: Normal appearance.  No lesions.  Vagina: No lesions or abnormalities noted.  Support: Normal pelvic support.  Urethra No masses tenderness or scarring.  Meatus Normal size without lesions or prolapse.  Cervix: Normal appearance.  No lesions.  Anus: Normal exam.  No lesions.  Perineum: Normal exam.  No lesions.        Bimanual   Adnexae: No masses.  Non-tender to palpation.  Uterus: Enlarged. 12wks  Non-tender.  Mobile.  AV.  Adnexae: No masses.  Non-tender to palpation.  Cul-de-sac: Negative for abnormality.  Adnexae: No masses.  Non-tender to palpation.         Pelvimetry   Diagonal: Reached.  Spines: Average.  Sacrum: Concave.  Pubic Arch: Normal.

## 2020-03-07 LAB — MATERNIT21  PLUS CORE+ESS+SCA, BLOOD
11q23 deletion (Jacobsen): NOT DETECTED
15q11 deletion (PW Angelman): NOT DETECTED
1p36 deletion syndrome: NOT DETECTED
22q11 deletion (DiGeorge): NOT DETECTED
4p16 deletion(Wolf-Hirschhorn): NOT DETECTED
5p15 deletion (Cri-du-chat): NOT DETECTED
8q24 deletion (Langer-Giedion): NOT DETECTED
Fetal Fraction: 6
Monosomy X (Turner Syndrome): NOT DETECTED
Result (T21): NEGATIVE
Trisomy 13 (Patau syndrome): NEGATIVE
Trisomy 16: NOT DETECTED
Trisomy 18 (Edwards syndrome): NEGATIVE
Trisomy 21 (Down syndrome): NEGATIVE
Trisomy 22: NOT DETECTED
XXX (Triple X Syndrome): NOT DETECTED
XXY (Klinefelter Syndrome): NOT DETECTED
XYY (Jacobs Syndrome): NOT DETECTED

## 2020-03-31 ENCOUNTER — Other Ambulatory Visit: Payer: Self-pay

## 2020-03-31 ENCOUNTER — Ambulatory Visit (INDEPENDENT_AMBULATORY_CARE_PROVIDER_SITE_OTHER): Payer: Medicaid Other | Admitting: Obstetrics and Gynecology

## 2020-03-31 ENCOUNTER — Encounter: Payer: Self-pay | Admitting: Obstetrics and Gynecology

## 2020-03-31 VITALS — BP 113/78 | HR 112 | Wt 236.4 lb

## 2020-03-31 DIAGNOSIS — R638 Other symptoms and signs concerning food and fluid intake: Secondary | ICD-10-CM

## 2020-03-31 DIAGNOSIS — Z3A15 15 weeks gestation of pregnancy: Secondary | ICD-10-CM

## 2020-03-31 DIAGNOSIS — N949 Unspecified condition associated with female genital organs and menstrual cycle: Secondary | ICD-10-CM

## 2020-03-31 DIAGNOSIS — Z3402 Encounter for supervision of normal first pregnancy, second trimester: Secondary | ICD-10-CM

## 2020-03-31 LAB — POCT URINALYSIS DIPSTICK OB
Bilirubin, UA: NEGATIVE
Blood, UA: NEGATIVE
Glucose, UA: NEGATIVE
Ketones, UA: NEGATIVE
Leukocytes, UA: NEGATIVE
Nitrite, UA: NEGATIVE
POC,PROTEIN,UA: NEGATIVE
Spec Grav, UA: 1.025 (ref 1.010–1.025)
Urobilinogen, UA: 0.2 E.U./dL
pH, UA: 6 (ref 5.0–8.0)

## 2020-03-31 NOTE — Progress Notes (Signed)
ROB-Pt present for routine prenatal care. Pt stated having lower abd and pelvic pain. No other problems.

## 2020-03-31 NOTE — Patient Instructions (Addendum)
Second Trimester of Pregnancy The second trimester is from week 14 through week 27 (months 4 through 6). The second trimester is often a time when you feel your best. Your body has adjusted to being pregnant, and you begin to feel better physically. Usually, morning sickness has lessened or quit completely, you may have more energy, and you may have an increase in appetite. The second trimester is also a time when the fetus is growing rapidly. At the end of the sixth month, the fetus is about 9 inches long and weighs about 1 pounds. You will likely begin to feel the baby move (quickening) between 16 and 20 weeks of pregnancy. Body changes during your second trimester Your body continues to go through many changes during your second trimester. The changes vary from woman to woman.  Your weight will continue to increase. You will notice your lower abdomen bulging out.  You may begin to get stretch marks on your hips, abdomen, and breasts.  You may develop headaches that can be relieved by medicines. The medicines should be approved by your health care provider.  You may urinate more often because the fetus is pressing on your bladder.  You may develop or continue to have heartburn as a result of your pregnancy.  You may develop constipation because certain hormones are causing the muscles that push waste through your intestines to slow down.  You may develop hemorrhoids or swollen, bulging veins (varicose veins).  You may have back pain. This is caused by: ? Weight gain. ? Pregnancy hormones that are relaxing the joints in your pelvis. ? A shift in weight and the muscles that support your balance.  Your breasts will continue to grow and they will continue to become tender.  Your gums may bleed and may be sensitive to brushing and flossing.  Dark spots or blotches (chloasma, mask of pregnancy) may develop on your face. This will likely fade after the baby is born.  A dark line from your  belly button to the pubic area (linea nigra) may appear. This will likely fade after the baby is born.  You may have changes in your hair. These can include thickening of your hair, rapid growth, and changes in texture. Some women also have hair loss during or after pregnancy, or hair that feels dry or thin. Your hair will most likely return to normal after your baby is born. What to expect at prenatal visits During a routine prenatal visit:  You will be weighed to make sure you and the fetus are growing normally.  Your blood pressure will be taken.  Your abdomen will be measured to track your baby's growth.  The fetal heartbeat will be listened to.  Any test results from the previous visit will be discussed. Your health care provider may ask you:  How you are feeling.  If you are feeling the baby move.  If you have had any abnormal symptoms, such as leaking fluid, bleeding, severe headaches, or abdominal cramping.  If you are using any tobacco products, including cigarettes, chewing tobacco, and electronic cigarettes.  If you have any questions. Other tests that may be performed during your second trimester include:  Blood tests that check for: ? Low iron levels (anemia). ? High blood sugar that affects pregnant women (gestational diabetes) between 24 and 28 weeks. ? Rh antibodies. This is to check for a protein on red blood cells (Rh factor).  Urine tests to check for infections, diabetes, or protein in the   urine.  An ultrasound to confirm the proper growth and development of the baby.  An amniocentesis to check for possible genetic problems.  Fetal screens for spina bifida and Down syndrome.  HIV (human immunodeficiency virus) testing. Routine prenatal testing includes screening for HIV, unless you choose not to have this test. Follow these instructions at home: Medicines  Follow your health care provider's instructions regarding medicine use. Specific medicines may be  either safe or unsafe to take during pregnancy.  Take a prenatal vitamin that contains at least 600 micrograms (mcg) of folic acid.  If you develop constipation, try taking a stool softener if your health care provider approves. Eating and drinking   Eat a balanced diet that includes fresh fruits and vegetables, whole grains, good sources of protein such as meat, eggs, or tofu, and low-fat dairy. Your health care provider will help you determine the amount of weight gain that is right for you.  Avoid raw meat and uncooked cheese. These carry germs that can cause birth defects in the baby.  If you have low calcium intake from food, talk to your health care provider about whether you should take a daily calcium supplement.  Limit foods that are high in fat and processed sugars, such as fried and sweet foods.  To prevent constipation: ? Drink enough fluid to keep your urine clear or pale yellow. ? Eat foods that are high in fiber, such as fresh fruits and vegetables, whole grains, and beans. Activity  Exercise only as directed by your health care provider. Most women can continue their usual exercise routine during pregnancy. Try to exercise for 30 minutes at least 5 days a week. Stop exercising if you experience uterine contractions.  Avoid heavy lifting, wear low heel shoes, and practice good posture.  A sexual relationship may be continued unless your health care provider directs you otherwise. Relieving pain and discomfort  Wear a good support bra to prevent discomfort from breast tenderness.  Take warm sitz baths to soothe any pain or discomfort caused by hemorrhoids. Use hemorrhoid cream if your health care provider approves.  Rest with your legs elevated if you have leg cramps or low back pain.  If you develop varicose veins, wear support hose. Elevate your feet for 15 minutes, 3-4 times a day. Limit salt in your diet. Prenatal Care  Write down your questions. Take them to  your prenatal visits.  Keep all your prenatal visits as told by your health care provider. This is important. Safety  Wear your seat belt at all times when driving.  Make a list of emergency phone numbers, including numbers for family, friends, the hospital, and police and fire departments. General instructions  Ask your health care provider for a referral to a local prenatal education class. Begin classes no later than the beginning of month 6 of your pregnancy.  Ask for help if you have counseling or nutritional needs during pregnancy. Your health care provider can offer advice or refer you to specialists for help with various needs.  Do not use hot tubs, steam rooms, or saunas.  Do not douche or use tampons or scented sanitary pads.  Do not cross your legs for long periods of time.  Avoid cat litter boxes and soil used by cats. These carry germs that can cause birth defects in the baby and possibly loss of the fetus by miscarriage or stillbirth.  Avoid all smoking, herbs, alcohol, and unprescribed drugs. Chemicals in these products can affect the formation   and growth of the baby.  Do not use any products that contain nicotine or tobacco, such as cigarettes and e-cigarettes. If you need help quitting, ask your health care provider.  Visit your dentist if you have not gone yet during your pregnancy. Use a soft toothbrush to brush your teeth and be gentle when you floss. Contact a health care provider if:  You have dizziness.  You have mild pelvic cramps, pelvic pressure, or nagging pain in the abdominal area.  You have persistent nausea, vomiting, or diarrhea.  You have a bad smelling vaginal discharge.  You have pain when you urinate. Get help right away if:  You have a fever.  You are leaking fluid from your vagina.  You have spotting or bleeding from your vagina.  You have severe abdominal cramping or pain.  You have rapid weight gain or weight loss.  You have  shortness of breath with chest pain.  You notice sudden or extreme swelling of your face, hands, ankles, feet, or legs.  You have not felt your baby move in over an hour.  You have severe headaches that do not go away when you take medicine.  You have vision changes. Summary  The second trimester is from week 14 through week 27 (months 4 through 6). It is also a time when the fetus is growing rapidly.  Your body goes through many changes during pregnancy. The changes vary from woman to woman.  Avoid all smoking, herbs, alcohol, and unprescribed drugs. These chemicals affect the formation and growth your baby.  Do not use any tobacco products, such as cigarettes, chewing tobacco, and e-cigarettes. If you need help quitting, ask your health care provider.  Contact your health care provider if you have any questions. Keep all prenatal visits as told by your health care provider. This is important. This information is not intended to replace advice given to you by your health care provider. Make sure you discuss any questions you have with your health care provider. Document Revised: 01/18/2019 Document Reviewed: 11/01/2016 Elsevier Patient Education  2020 [employer] TESTING FOR PREGNANCY  Pregnant women can develop a condition known as Gestational Diabetes (diabetes brought on by pregnancy) which can pose a risk to both mother and baby. A glucose tolerance test is a common type of testing for potential gestational diabetes.  There are several tests intended to identify gestational diabetes in pregnant women. The first, called the Glucose Challenge Screening, is a preliminary screening test performed between 26-28 weeks. If a woman tests positive during this screening test, the second test, called the Glucose Tolerance Test, may be performed. This test will diagnose whether diabetes exists or not by indicating whether or not the body is using glucose (a type of  sugar) effectively.  The Glucose Challenge Screening is now considered to be a standard test performed during the early part of the third trimester of pregnancy.  What is the Glucose Challenge Screening Test? No preparation is required prior to the test. During the test, the mother is asked to drink a sweet liquid (glucose) and then will have blood drawn one hour from having the drink, as blood glucose levels normally peak within one hour. No fasting is required prior to this test.  The test evaluates how your body processes sugar. A high level in your blood may indicate your body is not processing sugar effectively (positive test). If the results of this screen are positive, the woman may have  the Glucose Tolerance Test performed. It is important to note that not all women who test positive for the Glucose Challenge Screening test are found to have diabetes upon further diagnosis.  What is the Glucose Tolerance Test? Prior to the taking the glucose tolerance test, your doctor will ask you to make sure and eat at least 150mg  of carbohydrates (about what you will get from a slice or two of bread) for three days prior to the time you will be asked to fast. You will not be permitted to eat or drink anything but sips of water for 14 hours prior to the test, so it is best to schedule the test for first thing in the morning.  Additionally, you should plan to have someone drive you to and from the test since your energy levels may be low and there is a slight possibility you may feel light-headed.  When you arrive, the technician will draw blood to measure your baseline "fasting blood glucose level". You will be asked to drink a larger volume (or more concentrated solution) of the glucose drink than was used in the initial Glucose Challenge Screening test. Your blood will be drawn and tested every hour for the next three hours.  The following are the values that the American Diabetes Association considers  to be abnormal during the Glucose Tolerance Test:  Interval Abnormal reading Fasting 95 mg/dl or higher One hour mg/dl or higher Two hours 884 mg/dl or higher Three hours 166 mg/dl or higher  What if my Glucose Tolerance Test Results are Abnormal? If only one of your readings comes back abnormal, your doctor may suggest some changes to your diet and/or test you again later in the pregnancy. If two or more of your readings come back abnormal, you'll be diagnosed with Gestational Diabetes and your doctor or midwife will talk to you about a treatment plan. Treating diabetes during pregnancy is extremely important to protect the health of both mother and baby.   Compiled using information from the following sources:  1. American Diabetes Association  https://www.diabetes.org  2. E[employer]  https://www.emedicine.com  3. 063 of Diabetes and Digestive and Kidney Diseases

## 2020-03-31 NOTE — Progress Notes (Signed)
ROB: Doing well, no major complaints except beginning to note some round ligament pain. Discussed asthma in pregnancy,currently stable on meds. Needs early glucola at 18 weeks. Normal MaterniT21, female. Discussed breastfeeding. Discussed doula program, given handout. FOB not involved with pregnancy. RTC in 4 weeks for OB visit and anatomy scan.    The following were addressed during this visit:  Breastfeeding Education - Early initiation of breastfeeding    Comments: Keeps milk supply adequate, helps contract uterus and slow bleeding, and early milk is the perfect first food and is easy to digest.   - Risks of giving your baby anything other than breast milk if you are breastfeeding    Comments: Make the baby less content with breastfeeds, may make my baby more susceptible to illness, and may reduce my milk supply.   - Exclusive breastfeeding for the first 6 months    Comments: Builds a healthy milk supply and keeps it up, protects baby from sickness and disease, and breastmilk has everything your baby needs for the first 6 months.

## 2020-04-14 ENCOUNTER — Other Ambulatory Visit: Payer: Medicaid Other

## 2020-04-14 ENCOUNTER — Other Ambulatory Visit: Payer: Self-pay

## 2020-04-14 DIAGNOSIS — R638 Other symptoms and signs concerning food and fluid intake: Secondary | ICD-10-CM

## 2020-04-15 LAB — GLUCOSE, 1 HOUR GESTATIONAL: Gestational Diabetes Screen: 79 mg/dL (ref 65–139)

## 2020-04-21 ENCOUNTER — Encounter: Payer: Self-pay | Admitting: Obstetrics and Gynecology

## 2020-04-21 ENCOUNTER — Ambulatory Visit (INDEPENDENT_AMBULATORY_CARE_PROVIDER_SITE_OTHER): Payer: Medicaid Other | Admitting: Obstetrics and Gynecology

## 2020-04-21 ENCOUNTER — Other Ambulatory Visit: Payer: Self-pay

## 2020-04-21 VITALS — BP 112/77 | HR 93 | Wt 239.5 lb

## 2020-04-21 DIAGNOSIS — B373 Candidiasis of vulva and vagina: Secondary | ICD-10-CM | POA: Diagnosis not present

## 2020-04-21 DIAGNOSIS — B3731 Acute candidiasis of vulva and vagina: Secondary | ICD-10-CM

## 2020-04-21 DIAGNOSIS — Z3402 Encounter for supervision of normal first pregnancy, second trimester: Secondary | ICD-10-CM

## 2020-04-21 LAB — POCT URINALYSIS DIPSTICK OB
Bilirubin, UA: NEGATIVE
Blood, UA: NEGATIVE
Glucose, UA: NEGATIVE
Ketones, UA: NEGATIVE
Leukocytes, UA: NEGATIVE
Nitrite, UA: NEGATIVE
POC,PROTEIN,UA: NEGATIVE
Spec Grav, UA: 1.01 (ref 1.010–1.025)
Urobilinogen, UA: 0.2 E.U./dL
pH, UA: 7 (ref 5.0–8.0)

## 2020-04-21 NOTE — Addendum Note (Signed)
Addended by: Dorian Pod on: 04/21/2020 11:18 AM   Modules accepted: Orders

## 2020-04-21 NOTE — Progress Notes (Signed)
HPI:      Ms. Staysha Truby is a 19 y.o. G1P0000 who LMP was Patient's last menstrual period was 12/11/2019 (lmp unknown).  Subjective:   She presents today with complaint of 2-day history of vulvar irritation and burning.  She wonders if it has to do with her washing herself very hard.  She also complained of some bumps on her labia that have now gone away.  She also states that the itching and burning is not as bad today as it was yesterday.  She denies burning with urination.  She denies vulvar blisters.  She reports no new sexual contacts and is not concerned with her current sexual partner.  She does complain of a white vaginal discharge.    Hx: The following portions of the patient's history were reviewed and updated as appropriate:             She  has a past medical history of Asthma. She does not have a problem list on file. She  has a past surgical history that includes Foot surgery (2015) and Tooth extraction (2015). Her family history includes Hypertension in her mother; Lupus in her mother; Rheum arthritis in her mother; Schizophrenia in her father. She  reports that she has never smoked. She has never used smokeless tobacco. She reports that she does not drink alcohol and does not use drugs. She has a current medication list which includes the following prescription(s): advair diskus, benzaclin, epipen 2-pak, fluticasone, metadate cd, pataday, vitafol ultra, and proair hfa. She has No Known Allergies.       Review of Systems:  Review of Systems  Constitutional: Denied constitutional symptoms, night sweats, recent illness, fatigue, fever, insomnia and weight loss.  Eyes: Denied eye symptoms, eye pain, photophobia, vision change and visual disturbance.  Ears/Nose/Throat/Neck: Denied ear, nose, throat or neck symptoms, hearing loss, nasal discharge, sinus congestion and sore throat.  Cardiovascular: Denied cardiovascular symptoms, arrhythmia, chest pain/pressure, edema, exercise  intolerance, orthopnea and palpitations.  Respiratory: Denied pulmonary symptoms, asthma, pleuritic pain, productive sputum, cough, dyspnea and wheezing.  Gastrointestinal: Denied, gastro-esophageal reflux, melena, nausea and vomiting.  Genitourinary: See HPI for additional information.  Musculoskeletal: Denied musculoskeletal symptoms, stiffness, swelling, muscle weakness and myalgia.  Dermatologic: Denied dermatology symptoms, rash and scar.  Neurologic: Denied neurology symptoms, dizziness, headache, neck pain and syncope.  Psychiatric: Denied psychiatric symptoms, anxiety and depression.  Endocrine: Denied endocrine symptoms including hot flashes and night sweats.   Meds:   Current Outpatient Medications on File Prior to Visit  Medication Sig Dispense Refill  . ADVAIR DISKUS 100-50 MCG/DOSE AEPB 1 puff 2 (two) times daily.    Marland Kitchen BENZACLIN gel   0  . EPIPEN 2-PAK 0.3 MG/0.3ML SOAJ injection use as directed by prescriber if needed  0  . fluticasone (FLONASE) 50 MCG/ACT nasal spray   0  . METADATE CD 20 MG CR capsule Take 20 mg by mouth every morning.  0  . PATADAY 0.2 % SOLN   0  . Prenat-Fe Poly-Methfol-FA-DHA (VITAFOL ULTRA) 29-0.6-0.4-200 MG CAPS Take 1 tablet by mouth daily. 30 capsule 11  . PROAIR HFA 108 (90 BASE) MCG/ACT inhaler inhale 1 to 2 puffs every 4 to 6 hours if needed for cough if needed  0   No current facility-administered medications on file prior to visit.    Objective:     Vitals:   04/21/20 1011  BP: 112/77  Pulse: 93  Physical examination   Pelvic:   Vulva: Normal appearance.  No lesions.  Some erythema noted and some areas of excoriation.  No blisters or warts noted.  Vagina: No lesions or abnormalities noted.  Support: Normal pelvic support.  Urethra No masses tenderness or scarring.  Meatus Normal size without lesions or prolapse.  Cervix: Normal appearance.  No lesions.  Anus: Normal exam.  No lesions.  Perineum: Normal exam.  No  lesions.  WET PREP: clue cells: absent, KOH (yeast): positive, odor: absent and trichomoniasis: negative Ph:  < 4.5   Assessment:    G1P0000 There are no problems to display for this patient.    1. Monilial vulvovaginitis   2. Encounter for supervision of normal first pregnancy in second trimester        Plan:            1.  Discussed use of Monistat during pregnancy.  2.  Patient to follow-up in 1 week as previously scheduled for ultrasound and OB visit.  Will readdress any concerns with continued irritation at that time.  If patient continues to have symptoms consider complete cultures. Orders Orders Placed This Encounter  Procedures  . POC Urinalysis Dipstick OB    No orders of the defined types were placed in this encounter.       F/U  No follow-ups on file. I spent 21 minutes involved in the care of this patient preparing to see the patient by obtaining and reviewing her medical history (including labs, imaging tests and prior procedures), documenting clinical information in the electronic health record (EHR), counseling and coordinating care plans, writing and sending prescriptions, ordering tests or procedures and directly communicating with the patient by discussing pertinent items from her history and physical exam as well as detailing my assessment and plan as noted above so that she has an informed understanding.  All of her questions were answered.  Elonda Husky, M.D. 04/21/2020 10:58 AM

## 2020-04-22 ENCOUNTER — Other Ambulatory Visit: Payer: Self-pay

## 2020-04-22 ENCOUNTER — Ambulatory Visit (INDEPENDENT_AMBULATORY_CARE_PROVIDER_SITE_OTHER): Payer: Medicaid Other | Admitting: Dermatology

## 2020-04-22 DIAGNOSIS — L700 Acne vulgaris: Secondary | ICD-10-CM

## 2020-04-22 DIAGNOSIS — L308 Other specified dermatitis: Secondary | ICD-10-CM | POA: Diagnosis not present

## 2020-04-22 MED ORDER — BENZACLIN 1-5 % EX GEL: CUTANEOUS | 0 refills | Status: DC

## 2020-04-22 NOTE — Progress Notes (Signed)
   Follow-Up Visit   Subjective  Sandy Burns is a 19 y.o. female who presents for the following: Acne (pt here to discuss acne on her face, pt stopped Retin a because she is [redacted] weeks pregnant, pt using Benzaclin gel ) and Eczema (flare on her arms, pt is not treating her eczema ).  The following portions of the chart were reviewed this encounter and updated as appropriate:  Tobacco  Allergies  Meds  Problems  Med Hx  Surg Hx  Fam Hx     Review of Systems:  No other skin or systemic complaints except as noted in HPI or Assessment and Plan.  Objective  Well appearing patient in no apparent distress; mood and affect are within normal limits.  A focused examination was performed including face, chest, back, arms . Relevant physical exam findings are noted in the Assessment and Plan.  Objective  Head - Anterior (Face): Scattered few non inflamed comedones of the face   Objective  Left Antecubital Fossa: Dyspigmented lichenification and scale, stretch marks    Assessment & Plan    Acne vulgaris Head - Anterior (Face)  Pt instructed to discuss with GYN about about continuing Benzaclin   D/C Retin A during pregnancy (she has already done so)   Cont Benzaclin gel apply to face qam if authorized by her OB  Reordered Medications BENZACLIN gel  Other eczema/ Atopic Dermatitis with dyschromia and lichenification (and striae) Left Antecubital Fossa  We will research Eucrisa to make sure it's safe to use during pregnancy, we will call pt with this information (could not determine today with research).  Pt advised she must also clear use of Eucrisa with Obstetrician if she wants to use it. Do not recommend use of topical steroids for this pts eczema since she has striae of antecubital areas.  Return in about 6 months (around 10/23/2020) for eczema, acne .   IAngelique Holm, CMA, am acting as scribe for Armida Sans, MD .Documentation: I have reviewed the above documentation  for accuracy and completeness, and I agree with the above.  Armida Sans, MD

## 2020-04-26 ENCOUNTER — Encounter: Payer: Self-pay | Admitting: Dermatology

## 2020-04-28 ENCOUNTER — Ambulatory Visit (INDEPENDENT_AMBULATORY_CARE_PROVIDER_SITE_OTHER): Payer: Medicaid Other

## 2020-04-28 ENCOUNTER — Encounter: Payer: Self-pay | Admitting: Obstetrics and Gynecology

## 2020-04-28 ENCOUNTER — Ambulatory Visit (INDEPENDENT_AMBULATORY_CARE_PROVIDER_SITE_OTHER): Payer: Medicaid Other | Admitting: Obstetrics and Gynecology

## 2020-04-28 VITALS — BP 109/78 | HR 94 | Wt 241.1 lb

## 2020-04-28 DIAGNOSIS — Z3A19 19 weeks gestation of pregnancy: Secondary | ICD-10-CM

## 2020-04-28 DIAGNOSIS — Z3402 Encounter for supervision of normal first pregnancy, second trimester: Secondary | ICD-10-CM

## 2020-04-28 LAB — POCT URINALYSIS DIPSTICK OB
Bilirubin, UA: NEGATIVE
Blood, UA: NEGATIVE
Glucose, UA: NEGATIVE
Ketones, UA: NEGATIVE
Leukocytes, UA: NEGATIVE
Nitrite, UA: NEGATIVE
Spec Grav, UA: 1.01 (ref 1.010–1.025)
Urobilinogen, UA: 0.2 E.U./dL
pH, UA: 6.5 (ref 5.0–8.0)

## 2020-04-28 NOTE — Progress Notes (Signed)
ROB: Patient had anatomy scan today-normal.  She has no complaints.  Not sure if baby is moving yet.  Taking vitamins as directed.  Early 1 hour GCT-normal.

## 2020-04-29 ENCOUNTER — Telehealth: Payer: Self-pay

## 2020-04-29 NOTE — Telephone Encounter (Signed)
Received fax from [facility] in [place] that they are not able to get BenzaClin for patient. I called patient to see if OB approved BenzaClin while she is pregnant and is so to please call our office and let us know a different pharmacy we can send her rx to, JS

## 2020-05-26 ENCOUNTER — Other Ambulatory Visit: Payer: Self-pay

## 2020-05-26 ENCOUNTER — Ambulatory Visit (INDEPENDENT_AMBULATORY_CARE_PROVIDER_SITE_OTHER): Payer: Medicaid Other | Admitting: Obstetrics and Gynecology

## 2020-05-26 ENCOUNTER — Encounter: Payer: Self-pay | Admitting: Obstetrics and Gynecology

## 2020-05-26 VITALS — BP 116/85 | HR 118 | Wt 245.5 lb

## 2020-05-26 DIAGNOSIS — Z3A23 23 weeks gestation of pregnancy: Secondary | ICD-10-CM

## 2020-05-26 DIAGNOSIS — Z131 Encounter for screening for diabetes mellitus: Secondary | ICD-10-CM

## 2020-05-26 DIAGNOSIS — Z3402 Encounter for supervision of normal first pregnancy, second trimester: Secondary | ICD-10-CM

## 2020-05-26 LAB — POCT URINALYSIS DIPSTICK OB
Bilirubin, UA: NEGATIVE
Blood, UA: NEGATIVE
Glucose, UA: NEGATIVE
Ketones, UA: NEGATIVE
Leukocytes, UA: NEGATIVE
Nitrite, UA: NEGATIVE
POC,PROTEIN,UA: NEGATIVE
Spec Grav, UA: 1.02 (ref 1.010–1.025)
Urobilinogen, UA: 0.2 E.U./dL
pH, UA: 6.5 (ref 5.0–8.0)

## 2020-05-26 NOTE — Patient Instructions (Signed)
Breastfeeding  Choosing to breastfeed is one of the best decisions you can make for yourself and your baby. A change in hormones during pregnancy causes your breasts to make breast milk in your milk-producing glands. Hormones prevent breast milk from being released before your baby is born. They also prompt milk flow after birth. Once breastfeeding has begun, thoughts of your baby, as well as his or her sucking or crying, can stimulate the release of milk from your milk-producing glands. Benefits of breastfeeding Research shows that breastfeeding offers many health benefits for infants and mothers. It also offers a cost-free and convenient way to feed your baby. For your baby  Your first milk (colostrum) helps your baby's digestive system to function better.  Special cells in your milk (antibodies) help your baby to fight off infections.  Breastfed babies are less likely to develop asthma, allergies, obesity, or type 2 diabetes. They are also at lower risk for sudden infant death syndrome (SIDS).  Nutrients in breast milk are better able to meet your baby's needs compared to infant formula.  Breast milk improves your baby's brain development. For you  Breastfeeding helps to create a very special bond between you and your baby.  Breastfeeding is convenient. Breast milk costs nothing and is always available at the correct temperature.  Breastfeeding helps to burn calories. It helps you to lose the weight that you gained during pregnancy.  Breastfeeding makes your uterus return faster to its size before pregnancy. It also slows bleeding (lochia) after you give birth.  Breastfeeding helps to lower your risk of developing type 2 diabetes, osteoporosis, rheumatoid arthritis, cardiovascular disease, and breast, ovarian, uterine, and endometrial cancer later in life. Breastfeeding basics Starting breastfeeding  Find a comfortable place to sit or lie down, with your neck and back  well-supported.  Place a pillow or a rolled-up blanket under your baby to bring him or her to the level of your breast (if you are seated). Nursing pillows are specially designed to help support your arms and your baby while you breastfeed.  Make sure that your baby's tummy (abdomen) is facing your abdomen.  Gently massage your breast. With your fingertips, massage from the outer edges of your breast inward toward the nipple. This encourages milk flow. If your milk flows slowly, you may need to continue this action during the feeding.  Support your breast with 4 fingers underneath and your thumb above your nipple (make the letter "C" with your hand). Make sure your fingers are well away from your nipple and your baby's mouth.  Stroke your baby's lips gently with your finger or nipple.  When your baby's mouth is open wide enough, quickly bring your baby to your breast, placing your entire nipple and as much of the areola as possible into your baby's mouth. The areola is the colored area around your nipple. ? More areola should be visible above your baby's upper lip than below the lower lip. ? Your baby's lips should be opened and extended outward (flanged) to ensure an adequate, comfortable latch. ? Your baby's tongue should be between his or her lower gum and your breast.  Make sure that your baby's mouth is correctly positioned around your nipple (latched). Your baby's lips should create a seal on your breast and be turned out (everted).  It is common for your baby to suck about 2-3 minutes in order to start the flow of breast milk. Latching Teaching your baby how to latch onto your breast properly is  very important. An improper latch can cause nipple pain, decreased milk supply, and poor weight gain in your baby. Also, if your baby is not latched onto your nipple properly, he or she may swallow some air during feeding. This can make your baby fussy. Burping your baby when you switch breasts  during the feeding can help to get rid of the air. However, teaching your baby to latch on properly is still the best way to prevent fussiness from swallowing air while breastfeeding. Signs that your baby has successfully latched onto your nipple  Silent tugging or silent sucking, without causing you pain. Infant's lips should be extended outward (flanged).  Swallowing heard between every 3-4 sucks once your milk has started to flow (after your let-down milk reflex occurs).  Muscle movement above and in front of his or her ears while sucking. Signs that your baby has not successfully latched onto your nipple  Sucking sounds or smacking sounds from your baby while breastfeeding.  Nipple pain. If you think your baby has not latched on correctly, slip your finger into the corner of your baby's mouth to break the suction and place it between your baby's gums. Attempt to start breastfeeding again. Signs of successful breastfeeding Signs from your baby  Your baby will gradually decrease the number of sucks or will completely stop sucking.  Your baby will fall asleep.  Your baby's body will relax.  Your baby will retain a small amount of milk in his or her mouth.  Your baby will let go of your breast by himself or herself. Signs from you  Breasts that have increased in firmness, weight, and size 1-3 hours after feeding.  Breasts that are softer immediately after breastfeeding.  Increased milk volume, as well as a change in milk consistency and color by the fifth day of breastfeeding.  Nipples that are not sore, cracked, or bleeding. Signs that your baby is getting enough milk  Wetting at least 1-2 diapers during the first 24 hours after birth.  Wetting at least 5-6 diapers every 24 hours for the first week after birth. The urine should be clear or pale yellow by the age of 5 days.  Wetting 6-8 diapers every 24 hours as your baby continues to grow and develop.  At least 3 stools in  a 24-hour period by the age of 5 days. The stool should be soft and yellow.  At least 3 stools in a 24-hour period by the age of 7 days. The stool should be seedy and yellow.  No loss of weight greater than 10% of birth weight during the first 3 days of life.  Average weight gain of 4-7 oz (113-198 g) per week after the age of 4 days.  Consistent daily weight gain by the age of 5 days, without weight loss after the age of 2 weeks. After a feeding, your baby may spit up a small amount of milk. This is normal. Breastfeeding frequency and duration Frequent feeding will help you make more milk and can prevent sore nipples and extremely full breasts (breast engorgement). Breastfeed when you feel the need to reduce the fullness of your breasts or when your baby shows signs of hunger. This is called "breastfeeding on demand." Signs that your baby is hungry include:  Increased alertness, activity, or restlessness.  Movement of the head from side to side.  Opening of the mouth when the corner of the mouth or cheek is stroked (rooting).  Increased sucking sounds, smacking lips, cooing,   sighing, or squeaking.  Hand-to-mouth movements and sucking on fingers or hands.  Fussing or crying. Avoid introducing a pacifier to your baby in the first 4-6 weeks after your baby is born. After this time, you may choose to use a pacifier. Research has shown that pacifier use during the first year of a baby's life decreases the risk of sudden infant death syndrome (SIDS). Allow your baby to feed on each breast as long as he or she wants. When your baby unlatches or falls asleep while feeding from the first breast, offer the second breast. Because newborns are often sleepy in the first few weeks of life, you may need to awaken your baby to get him or her to feed. Breastfeeding times will vary from baby to baby. However, the following rules can serve as a guide to help you make sure that your baby is properly  fed:  Newborns (babies 41 weeks of age or younger) may breastfeed every 1-3 hours.  Newborns should not go without breastfeeding for longer than 3 hours during the day or 5 hours during the night.  You should breastfeed your baby a minimum of 8 times in a 24-hour period. Breast milk pumping     Pumping and storing breast milk allows you to make sure that your baby is exclusively fed your breast milk, even at times when you are unable to breastfeed. This is especially important if you go back to work while you are still breastfeeding, or if you are not able to be present during feedings. Your lactation consultant can help you find a method of pumping that works best for you and give you guidelines about how long it is safe to store breast milk. Caring for your breasts while you breastfeed Nipples can become dry, cracked, and sore while breastfeeding. The following recommendations can help keep your breasts moisturized and healthy:  Avoid using soap on your nipples.  Wear a supportive bra designed especially for nursing. Avoid wearing underwire-style bras or extremely tight bras (sports bras).  Air-dry your nipples for 3-4 minutes after each feeding.  Use only cotton bra pads to absorb leaked breast milk. Leaking of breast milk between feedings is normal.  Use lanolin on your nipples after breastfeeding. Lanolin helps to maintain your skin's normal moisture barrier. Pure lanolin is not harmful (not toxic) to your baby. You may also hand express a few drops of breast milk and gently massage that milk into your nipples and allow the milk to air-dry. In the first few weeks after giving birth, some women experience breast engorgement. Engorgement can make your breasts feel heavy, warm, and tender to the touch. Engorgement peaks within 3-5 days after you give birth. The following recommendations can help to ease engorgement:  Completely empty your breasts while breastfeeding or pumping. You may  want to start by applying warm, moist heat (in the shower or with warm, water-soaked hand towels) just before feeding or pumping. This increases circulation and helps the milk flow. If your baby does not completely empty your breasts while breastfeeding, pump any extra milk after he or she is finished.  Apply ice packs to your breasts immediately after breastfeeding or pumping, unless this is too uncomfortable for you. To do this: ? Put ice in a plastic bag. ? Place a towel between your skin and the bag. ? Leave the ice on for 20 minutes, 2-3 times a day.  Make sure that your baby is latched on and positioned properly while breastfeeding. If  engorgement persists after 48 hours of following these recommendations, contact your health care provider or a Advertising copywriter. Overall health care recommendations while breastfeeding  Eat 3 healthy meals and 3 snacks every day. Well-nourished mothers who are breastfeeding need an additional 450-500 calories a day. You can meet this requirement by increasing the amount of a balanced diet that you eat.  Drink enough water to keep your urine pale yellow or clear.  Rest often, relax, and continue to take your prenatal vitamins to prevent fatigue, stress, and low vitamin and mineral levels in your body (nutrient deficiencies).  Do not use any products that contain nicotine or tobacco, such as cigarettes and e-cigarettes. Your baby may be harmed by chemicals from cigarettes that pass into breast milk and exposure to secondhand smoke. If you need help quitting, ask your health care provider.  Avoid alcohol.  Do not use illegal drugs or marijuana.  Talk with your health care provider before taking any medicines. These include over-the-counter and prescription medicines as well as vitamins and herbal supplements. Some medicines that may be harmful to your baby can pass through breast milk.  It is possible to become pregnant while breastfeeding. If birth  control is desired, ask your health care provider about options that will be safe while breastfeeding your baby. Where to find more information: [employer]: www.llli.org Contact a health care provider if:  You feel like you want to stop breastfeeding or have become frustrated with breastfeeding.  Your nipples are cracked or bleeding.  Your breasts are red, tender, or warm.  You have: ? Painful breasts or nipples. ? A swollen area on either breast. ? A fever or chills. ? Nausea or vomiting. ? Drainage other than breast milk from your nipples.  Your breasts do not become full before feedings by the fifth day after you give birth.  You feel sad and depressed.  Your baby is: ? Too sleepy to eat well. ? Having trouble sleeping. ? More than 17 week old and wetting fewer than 6 diapers in a 24-hour period. ? Not gaining weight by 61 days of age.  Your baby has fewer than 3 stools in a 24-hour period.  Your baby's skin or the white parts of his or her eyes become yellow. Get help right away if:  Your baby is overly tired (lethargic) and does not want to wake up and feed.  Your baby develops an unexplained fever. Summary  Breastfeeding offers many health benefits for infant and mothers.  Try to breastfeed your infant when he or she shows early signs of hunger.  Gently tickle or stroke your baby's lips with your finger or nipple to allow the baby to open his or her mouth. Bring the baby to your breast. Make sure that much of the areola is in your baby's mouth. Offer one side and burp the baby before you offer the other side.  Talk with your health care provider or lactation consultant if you have questions or you face problems as you breastfeed. This information is not intended to replace advice given to you by your health care provider. Make sure you discuss any questions you have with your health care provider. Document Revised: 12/21/2017 Document Reviewed:  10/28/2016 Elsevier Patient Education  2020 [employer]    GESTATIONAL DIABETES TESTING FOR PREGNANCY  Pregnant women can develop a condition known as Gestational Diabetes (diabetes brought on by pregnancy) which can pose a risk to both mother and baby. A glucose  tolerance test is a common type of testing for potential gestational diabetes.  There are several tests intended to identify gestational diabetes in pregnant women. The first, called the Glucose Challenge Screening, is a preliminary screening test performed between 26-28 weeks. If a woman tests positive during this screening test, the second test, called the Glucose Tolerance Test, may be performed. This test will diagnose whether diabetes exists or not by indicating whether or not the body is using glucose (a type of sugar) effectively.  The Glucose Challenge Screening is now considered to be a standard test performed during the early part of the third trimester of pregnancy.  What is the Glucose Challenge Screening Test? No preparation is required prior to the test. During the test, the mother is asked to drink a sweet liquid (glucose) and then will have blood drawn one hour from having the drink, as blood glucose levels normally peak within one hour. No fasting is required prior to this test.  The test evaluates how your body processes sugar. A high level in your blood may indicate your body is not processing sugar effectively (positive test). If the results of this screen are positive, the woman may have the Glucose Tolerance Test performed. It is important to note that not all women who test positive for the Glucose Challenge Screening test are found to have diabetes upon further diagnosis.  What is the Glucose Tolerance Test? Prior to the taking the glucose tolerance test, your doctor will ask you to make sure and eat at least 150mg  of carbohydrates (about what you will get from a slice or two of bread) for three days prior to  the time you will be asked to fast. You will not be permitted to eat or drink anything but sips of water for 14 hours prior to the test, so it is best to schedule the test for first thing in the morning.  Additionally, you should plan to have someone drive you to and from the test since your energy levels may be low and there is a slight possibility you may feel light-headed.  When you arrive, the technician will draw blood to measure your baseline "fasting blood glucose level". You will be asked to drink a larger volume (or more concentrated solution) of the glucose drink than was used in the initial Glucose Challenge Screening test. Your blood will be drawn and tested every hour for the next three hours.  The following are the values that the American Diabetes Association considers to be abnormal during the Glucose Tolerance Test:  Interval Abnormal reading Fasting 95 mg/dl or higher One hour mg/dl or higher Two hours 627 mg/dl or higher Three hours 035 mg/dl or higher  What if my Glucose Tolerance Test Results are Abnormal? If only one of your readings comes back abnormal, your doctor may suggest some changes to your diet and/or test you again later in the pregnancy. If two or more of your readings come back abnormal, you'll be diagnosed with Gestational Diabetes and your doctor or midwife will talk to you about a treatment plan. Treating diabetes during pregnancy is extremely important to protect the health of both mother and baby.   Compiled using information from the following sources:  1. American Diabetes Association  https://www.diabetes.org  2. [employer]  https://www.emedicine.com  3. 009 of Diabetes and Digestive and Kidney Diseases

## 2020-05-26 NOTE — Progress Notes (Signed)
ROB-Pt present for routine prenatal care. Pt stated having left side abd pain along with vaginal pain.

## 2020-05-26 NOTE — Progress Notes (Signed)
ROB: Patient notes increased discharge but denies odor or symptoms.  Notes vaginal/abdominal pain, usually when lying down. 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 Likely round ligament pain. Discussed minimizing lifting, use of belly band. Plans to breast and formula feed.  Handout given on breastfeeding. RTC in 4 weeks.  For 28 week labs then.   The following were addressed during this visit:  Breastfeeding Education - The importance of exclusive breastfeeding    Comments: Provides antibodies, Lower risk of breast and ovarian cancers, and type-2 diabetes,Helps your body recover, Reduced chance of SIDS.   - Frequent feeding to help assure optimal milk production    Comments: Making a full supply of milk requires frequent removal of milk from breasts, infant will eat 8-12 times in 24 hours, if separated from infant use breast massage, hand expression and/ or pumping to remove milk from breasts.   - Effective positioning and attachment    Comments: Helps my baby to get enough breast milk, helps to produce an adequate milk supply, and helps prevent nipple pain and damage

## 2020-06-30 ENCOUNTER — Encounter: Payer: Self-pay | Admitting: Obstetrics and Gynecology

## 2020-06-30 ENCOUNTER — Other Ambulatory Visit: Payer: Self-pay

## 2020-06-30 ENCOUNTER — Ambulatory Visit (INDEPENDENT_AMBULATORY_CARE_PROVIDER_SITE_OTHER): Payer: Medicaid Other | Admitting: Obstetrics and Gynecology

## 2020-06-30 ENCOUNTER — Other Ambulatory Visit: Payer: Medicaid Other

## 2020-06-30 DIAGNOSIS — Z3402 Encounter for supervision of normal first pregnancy, second trimester: Secondary | ICD-10-CM

## 2020-06-30 DIAGNOSIS — Z3A28 28 weeks gestation of pregnancy: Secondary | ICD-10-CM | POA: Diagnosis not present

## 2020-06-30 DIAGNOSIS — Z3493 Encounter for supervision of normal pregnancy, unspecified, third trimester: Secondary | ICD-10-CM

## 2020-06-30 DIAGNOSIS — Z131 Encounter for screening for diabetes mellitus: Secondary | ICD-10-CM

## 2020-06-30 DIAGNOSIS — Z23 Encounter for immunization: Secondary | ICD-10-CM | POA: Diagnosis not present

## 2020-06-30 LAB — POCT URINALYSIS DIPSTICK OB
Bilirubin, UA: NEGATIVE
Blood, UA: NEGATIVE
Glucose, UA: NEGATIVE
Ketones, UA: NEGATIVE
Leukocytes, UA: NEGATIVE
Nitrite, UA: NEGATIVE
POC,PROTEIN,UA: NEGATIVE
Spec Grav, UA: 1.01 (ref 1.010–1.025)
Urobilinogen, UA: 0.2 E.U./dL
pH, UA: 5 (ref 5.0–8.0)

## 2020-06-30 MED ORDER — TETANUS-DIPHTH-ACELL PERTUSSIS 5-2.5-18.5 LF-MCG/0.5 IM SUSP
0.5000 mL | Freq: Once | INTRAMUSCULAR | Status: AC
  Administered 2020-06-30: 0.5 mL via INTRAMUSCULAR

## 2020-06-30 NOTE — Progress Notes (Signed)
ROB: Patient still having some pelvic pressure.  She says that she has not figured out how to use her belly band but she is going to try harder figured out.  Asymptomatic vaginal discharge.  1 hour GCT today.

## 2020-07-01 LAB — CBC
Hematocrit: 32.1 % — ABNORMAL LOW (ref 34.0–46.6)
Hemoglobin: 10.7 g/dL — ABNORMAL LOW (ref 11.1–15.9)
MCH: 31.2 pg (ref 26.6–33.0)
MCHC: 33.3 g/dL (ref 31.5–35.7)
MCV: 94 fL (ref 79–97)
Platelets: 271 10*3/uL (ref 150–450)
RBC: 3.43 x10E6/uL — ABNORMAL LOW (ref 3.77–5.28)
RDW: 12.2 % (ref 11.7–15.4)
WBC: 6.8 10*3/uL (ref 3.4–10.8)

## 2020-07-01 LAB — GLUCOSE, 1 HOUR GESTATIONAL: Gestational Diabetes Screen: 99 mg/dL (ref 65–139)

## 2020-07-01 LAB — RPR: RPR Ser Ql: NONREACTIVE

## 2020-07-19 ENCOUNTER — Other Ambulatory Visit: Payer: Self-pay

## 2020-07-19 ENCOUNTER — Observation Stay
Admission: EM | Admit: 2020-07-19 | Discharge: 2020-07-19 | Disposition: A | Discharge status: Home / Self Care | Payer: Medicaid Other | Attending: Obstetrics and Gynecology | Admitting: Obstetrics and Gynecology

## 2020-07-19 ENCOUNTER — Encounter: Payer: Self-pay | Admitting: Obstetrics and Gynecology

## 2020-07-19 DIAGNOSIS — Z3A31 31 weeks gestation of pregnancy: Secondary | ICD-10-CM | POA: Insufficient documentation

## 2020-07-19 NOTE — OB Triage Note (Signed)
Pt is a 19y/o G1P0 at [redacted]w[redacted]d with c/o contractions that began around 7pm rating pain at 3-4/10. Pt states +FM. Pt denies LOF, CTX and VB. Monitors applied and assessing. Initial FHT 135.

## 2020-07-21 NOTE — Progress Notes (Signed)
ROB-Pt present for routine prenatal care. Pt c/o of contractions and went to the ED on 07/19/2020.

## 2020-07-21 NOTE — Discharge Summary (Signed)
    L&D OB Triage Note  SUBJECTIVE Sandy Burns is a 19 y.o. G1P0000 female at [redacted]w[redacted]d, EDD Estimated Date of Delivery: 09/16/20 who presented to triage with complaints of contractions every 10 minutes.  She denies leakage of fluid, vaginal bleeding or other problems.  Reports fetal movements.   OB History  Gravida Para Term Preterm AB Living  1 0 0 0 0 0  SAB TAB Ectopic Multiple Live Births  0 0 0 0 0    # Outcome Date GA Lbr Len/2nd Weight Sex Delivery Anes PTL Lv  1 Current             No medications prior to admission.     OBJECTIVE  Nursing Evaluation:   BP 127/78 (BP Location: Right Arm)   Pulse (!) 109   Temp 98.7 F (37.1 C) (Oral)   Resp 18   Ht 5\' 7"  (1.702 m)   Wt 114.8 kg   LMP 12/11/2019 (LMP Unknown)   BMI 39.63 kg/m    Findings:        Rare irregular contractions-no evidence of labor     Reassuring fetal heart rate      NST was performed and has been reviewed by me.  NST INTERPRETATION: Category I  Mode: External Baseline Rate (A): 125 bpm Variability: Moderate Accelerations: 15 x 15 Decelerations: None     Contraction Frequency (min): UI  ASSESSMENT Impression:  1.  Pregnancy:  G1P0000 at [redacted]w[redacted]d , EDD Estimated Date of Delivery: 09/16/20 2.  Reassuring fetal and maternal status   PLAN 1. Discussed current condition and above findings with patient and reassurance given.  All questions answered. 2. Discharge home with standard labor precautions given to return to L&D or call the office for problems. 3. Continue routine prenatal care.

## 2020-07-22 ENCOUNTER — Other Ambulatory Visit: Payer: Self-pay

## 2020-07-22 ENCOUNTER — Encounter: Payer: Self-pay | Admitting: Obstetrics and Gynecology

## 2020-07-22 ENCOUNTER — Ambulatory Visit (INDEPENDENT_AMBULATORY_CARE_PROVIDER_SITE_OTHER): Payer: Medicaid Other | Admitting: Obstetrics and Gynecology

## 2020-07-22 VITALS — BP 117/78 | HR 106 | Wt 256.1 lb

## 2020-07-22 DIAGNOSIS — Z3A32 32 weeks gestation of pregnancy: Secondary | ICD-10-CM

## 2020-07-22 DIAGNOSIS — Z3403 Encounter for supervision of normal first pregnancy, third trimester: Secondary | ICD-10-CM

## 2020-07-22 DIAGNOSIS — O479 False labor, unspecified: Secondary | ICD-10-CM

## 2020-07-22 LAB — POCT URINALYSIS DIPSTICK OB
Bilirubin, UA: NEGATIVE
Blood, UA: NEGATIVE
Glucose, UA: NEGATIVE
Ketones, UA: NEGATIVE
Leukocytes, UA: NEGATIVE
Nitrite, UA: NEGATIVE
POC,PROTEIN,UA: NEGATIVE
Spec Grav, UA: 1.02 (ref 1.010–1.025)
Urobilinogen, UA: 0.2 E.U./dL
pH, UA: 6 (ref 5.0–8.0)

## 2020-07-22 NOTE — Progress Notes (Signed)
ROB: Patient seen in triage over the weekend for contractions.  Still noting them but not as bad or as frequent. Further discussed breastfeeding. Pain management in labor discussed.   The following were addressed during this visit:  Breastfeeding Education - Nonpharmacological pain relief methods for labor    Comments: Deep breathing, focusing on pleasant things, movement and walking, heating pads or cold compress, massage and relaxation, continuous support from someone you trust, and Doulas   - The importance of early skin-to-skin contact    Comments: Keeps baby warm and secure, helps keep baby's blood sugar up and breathing steady, easier to bond and breastfeed, and helps calm baby.  - Rooming-in on a 24-hour basis    Comments: Easier to learn baby's feeding cues, easier to bond and get to know each other, and encourages milk production.   - Feeding on demand or baby-led feeding    Comments: Helps prevent breastfeeding complications, helps bring in good milk supply, prevents under or overfeeding, and helps baby feel content and satisfied

## 2020-07-22 NOTE — Patient Instructions (Addendum)
WHAT OB PATIENTS CAN EXPECT   Confirmation of pregnancy and ultrasound ordered if medically indicated-[redacted] weeks gestation  New OB (NOB) intake with nurse and New OB (NOB) labs- [redacted] weeks gestation  New OB (NOB) physical examination with provider- 11/[redacted] weeks gestation  Flu vaccine-[redacted] weeks gestation  Anatomy scan-[redacted] weeks gestation  Glucose tolerance test, blood work to test for anemia, T-dap vaccine-[redacted] weeks gestation  Vaginal swabs/cultures-STD/Group B strep-[redacted] weeks gestation  Appointments every 4 weeks until 28 weeks  Every 2 weeks from 28 weeks until 36 weeks  Weekly visits from 36 weeks until delivery  Breastfeeding  Choosing to breastfeed is one of the best decisions you can make for yourself and your baby. A change in hormones during pregnancy causes your breasts to make breast milk in your milk-producing glands. Hormones prevent breast milk from being released before your baby is born. They also prompt milk flow after birth. Once breastfeeding has begun, thoughts of your baby, as well as his or her sucking or crying, can stimulate the release of milk from your milk-producing glands. Benefits of breastfeeding Research shows that breastfeeding offers many health benefits for infants and mothers. It also offers a cost-free and convenient way to feed your baby. For your baby  Your first milk (colostrum) helps your baby's digestive system to function better.  Special cells in your milk (antibodies) help your baby to fight off infections.  Breastfed babies are less likely to develop asthma, allergies, obesity, or type 2 diabetes. They are also at lower risk for sudden infant death syndrome (SIDS).  Nutrients in breast milk are better able to meet your baby's needs compared to infant formula.  Breast milk improves your baby's brain development. For you  Breastfeeding helps to create a very special bond between you and your baby.  Breastfeeding is convenient. Breast milk costs  nothing and is always available at the correct temperature.  Breastfeeding helps to burn calories. It helps you to lose the weight that you gained during pregnancy.  Breastfeeding makes your uterus return faster to its size before pregnancy. It also slows bleeding (lochia) after you give birth.  Breastfeeding helps to lower your risk of developing type 2 diabetes, osteoporosis, rheumatoid arthritis, cardiovascular disease, and breast, ovarian, uterine, and endometrial cancer later in life. Breastfeeding basics Starting breastfeeding  Find a comfortable place to sit or lie down, with your neck and back well-supported.  Place a pillow or a rolled-up blanket under your baby to bring him or her to the level of your breast (if you are seated). Nursing pillows are specially designed to help support your arms and your baby while you breastfeed.  Make sure that your baby's tummy (abdomen) is facing your abdomen.  Gently massage your breast. With your fingertips, massage from the outer edges of your breast inward toward the nipple. This encourages milk flow. If your milk flows slowly, you may need to continue this action during the feeding.  Support your breast with 4 fingers underneath and your thumb above your nipple (make the letter "C" with your hand). Make sure your fingers are well away from your nipple and your baby's mouth.  Stroke your baby's lips gently with your finger or nipple.  When your baby's mouth is open wide enough, quickly bring your baby to your breast, placing your entire nipple and as much of the areola as possible into your baby's mouth. The areola is the colored area around your nipple. ? More areola should be visible above your  baby's upper lip than below the lower lip. ? Your baby's lips should be opened and extended outward (flanged) to ensure an adequate, comfortable latch. ? Your baby's tongue should be between his or her lower gum and your breast.  Make sure that your  baby's mouth is correctly positioned around your nipple (latched). Your baby's lips should create a seal on your breast and be turned out (everted).  It is common for your baby to suck about 2-3 minutes in order to start the flow of breast milk. Latching Teaching your baby how to latch onto your breast properly is very important. An improper latch can cause nipple pain, decreased milk supply, and poor weight gain in your baby. Also, if your baby is not latched onto your nipple properly, he or she may swallow some air during feeding. This can make your baby fussy. Burping your baby when you switch breasts during the feeding can help to get rid of the air. However, teaching your baby to latch on properly is still the best way to prevent fussiness from swallowing air while breastfeeding. Signs that your baby has successfully latched onto your nipple  Silent tugging or silent sucking, without causing you pain. Infant's lips should be extended outward (flanged).  Swallowing heard between every 3-4 sucks once your milk has started to flow (after your let-down milk reflex occurs).  Muscle movement above and in front of his or her ears while sucking. Signs that your baby has not successfully latched onto your nipple  Sucking sounds or smacking sounds from your baby while breastfeeding.  Nipple pain. If you think your baby has not latched on correctly, slip your finger into the corner of your baby's mouth to break the suction and place it between your baby's gums. Attempt to start breastfeeding again. Signs of successful breastfeeding Signs from your baby  Your baby will gradually decrease the number of sucks or will completely stop sucking.  Your baby will fall asleep.  Your baby's body will relax.  Your baby will retain a small amount of milk in his or her mouth.  Your baby will let go of your breast by himself or herself. Signs from you  Breasts that have increased in firmness, weight, and  size 1-3 hours after feeding.  Breasts that are softer immediately after breastfeeding.  Increased milk volume, as well as a change in milk consistency and color by the fifth day of breastfeeding.  Nipples that are not sore, cracked, or bleeding. Signs that your baby is getting enough milk  Wetting at least 1-2 diapers during the first 24 hours after birth.  Wetting at least 5-6 diapers every 24 hours for the first week after birth. The urine should be clear or pale yellow by the age of 5 days.  Wetting 6-8 diapers every 24 hours as your baby continues to grow and develop.  At least 3 stools in a 24-hour period by the age of 5 days. The stool should be soft and yellow.  At least 3 stools in a 24-hour period by the age of 7 days. The stool should be seedy and yellow.  No loss of weight greater than 10% of birth weight during the first 3 days of life.  Average weight gain of 4-7 oz (113-198 g) per week after the age of 4 days.  Consistent daily weight gain by the age of 5 days, without weight loss after the age of 2 weeks. After a feeding, your baby may spit up a  small amount of milk. This is normal. Breastfeeding frequency and duration Frequent feeding will help you make more milk and can prevent sore nipples and extremely full breasts (breast engorgement). Breastfeed when you feel the need to reduce the fullness of your breasts or when your baby shows signs of hunger. This is called "breastfeeding on demand." Signs that your baby is hungry include:  Increased alertness, activity, or restlessness.  Movement of the head from side to side.  Opening of the mouth when the corner of the mouth or cheek is stroked (rooting).  Increased sucking sounds, smacking lips, cooing, sighing, or squeaking.  Hand-to-mouth movements and sucking on fingers or hands.  Fussing or crying. Avoid introducing a pacifier to your baby in the first 4-6 weeks after your baby is born. After this time, you may  choose to use a pacifier. Research has shown that pacifier use during the first year of a baby's life decreases the risk of sudden infant death syndrome (SIDS). Allow your baby to feed on each breast as long as he or she wants. When your baby unlatches or falls asleep while feeding from the first breast, offer the second breast. Because newborns are often sleepy in the first few weeks of life, you may need to awaken your baby to get him or her to feed. Breastfeeding times will vary from baby to baby. However, the following rules can serve as a guide to help you make sure that your baby is properly fed:  Newborns (babies 43 weeks of age or younger) may breastfeed every 1-3 hours.  Newborns should not go without breastfeeding for longer than 3 hours during the day or 5 hours during the night.  You should breastfeed your baby a minimum of 8 times in a 24-hour period. Breast milk pumping     Pumping and storing breast milk allows you to make sure that your baby is exclusively fed your breast milk, even at times when you are unable to breastfeed. This is especially important if you go back to work while you are still breastfeeding, or if you are not able to be present during feedings. Your lactation consultant can help you find a method of pumping that works best for you and give you guidelines about how long it is safe to store breast milk. Caring for your breasts while you breastfeed Nipples can become dry, cracked, and sore while breastfeeding. The following recommendations can help keep your breasts moisturized and healthy:  Avoid using soap on your nipples.  Wear a supportive bra designed especially for nursing. Avoid wearing underwire-style bras or extremely tight bras (sports bras).  Air-dry your nipples for 3-4 minutes after each feeding.  Use only cotton bra pads to absorb leaked breast milk. Leaking of breast milk between feedings is normal.  Use lanolin on your nipples after  breastfeeding. Lanolin helps to maintain your skin's normal moisture barrier. Pure lanolin is not harmful (not toxic) to your baby. You may also hand express a few drops of breast milk and gently massage that milk into your nipples and allow the milk to air-dry. In the first few weeks after giving birth, some women experience breast engorgement. Engorgement can make your breasts feel heavy, warm, and tender to the touch. Engorgement peaks within 3-5 days after you give birth. The following recommendations can help to ease engorgement:  Completely empty your breasts while breastfeeding or pumping. You may want to start by applying warm, moist heat (in the shower or with warm, water-soaked  hand towels) just before feeding or pumping. This increases circulation and helps the milk flow. If your baby does not completely empty your breasts while breastfeeding, pump any extra milk after he or she is finished.  Apply ice packs to your breasts immediately after breastfeeding or pumping, unless this is too uncomfortable for you. To do this: ? Put ice in a plastic bag. ? Place a towel between your skin and the bag. ? Leave the ice on for 20 minutes, 2-3 times a day.  Make sure that your baby is latched on and positioned properly while breastfeeding. If engorgement persists after 48 hours of following these recommendations, contact your health care provider or a Science writer. Overall health care recommendations while breastfeeding  Eat 3 healthy meals and 3 snacks every day. Well-nourished mothers who are breastfeeding need an additional 450-500 calories a day. You can meet this requirement by increasing the amount of a balanced diet that you eat.  Drink enough water to keep your urine pale yellow or clear.  Rest often, relax, and continue to take your prenatal vitamins to prevent fatigue, stress, and low vitamin and mineral levels in your body (nutrient deficiencies).  Do not use any products that  contain nicotine or tobacco, such as cigarettes and e-cigarettes. Your baby may be harmed by chemicals from cigarettes that pass into breast milk and exposure to secondhand smoke. If you need help quitting, ask your health care provider.  Avoid alcohol.  Do not use illegal drugs or marijuana.  Talk with your health care provider before taking any medicines. These include over-the-counter and prescription medicines as well as vitamins and herbal supplements. Some medicines that may be harmful to your baby can pass through breast milk.  It is possible to become pregnant while breastfeeding. If birth control is desired, ask your health care provider about options that will be safe while breastfeeding your baby. Where to find more information: [employer]: www.llli.org Contact a health care provider if:  You feel like you want to stop breastfeeding or have become frustrated with breastfeeding.  Your nipples are cracked or bleeding.  Your breasts are red, tender, or warm.  You have: ? Painful breasts or nipples. ? A swollen area on either breast. ? A fever or chills. ? Nausea or vomiting. ? Drainage other than breast milk from your nipples.  Your breasts do not become full before feedings by the fifth day after you give birth.  You feel sad and depressed.  Your baby is: ? Too sleepy to eat well. ? Having trouble sleeping. ? More than 27 week old and wetting fewer than 6 diapers in a 24-hour period. ? Not gaining weight by 60 days of age.  Your baby has fewer than 3 stools in a 24-hour period.  Your baby's skin or the white parts of his or her eyes become yellow. Get help right away if:  Your baby is overly tired (lethargic) and does not want to wake up and feed.  Your baby develops an unexplained fever. Summary  Breastfeeding offers many health benefits for infant and mothers.  Try to breastfeed your infant when he or she shows early signs of  hunger.  Gently tickle or stroke your baby's lips with your finger or nipple to allow the baby to open his or her mouth. Bring the baby to your breast. Make sure that much of the areola is in your baby's mouth. Offer one side and burp the baby before you offer  the other side.  Talk with your health care provider or lactation consultant if you have questions or you face problems as you breastfeed. This information is not intended to replace advice given to you by your health care provider. Make sure you discuss any questions you have with your health care provider. Document Revised: 12/21/2017 Document Reviewed: 10/28/2016 Elsevier Patient Education  [place] of Pregnancy  The third trimester is from week 28 through week 40 (months 7 through 9). This trimester is when your unborn baby (fetus) is growing very fast. At the end of the ninth month, the unborn baby is about 20 inches in length. It weighs about 6-10 pounds. Follow these instructions at home: Medicines  Take over-the-counter and prescription medicines only as told by your doctor. Some medicines are safe and some medicines are not safe during pregnancy.  Take a prenatal vitamin that contains at least 600 micrograms (mcg) of folic acid.  If you have trouble pooping (constipation), take medicine that will make your stool soft (stool softener) if your doctor approves. Eating and drinking   Eat regular, healthy meals.  Avoid raw meat and uncooked cheese.  If you get low calcium from the food you eat, talk to your doctor about taking a daily calcium supplement.  Eat four or five small meals rather than three large meals a day.  Avoid foods that are high in fat and sugars, such as fried and sweet foods.  To prevent constipation: ? Eat foods that are high in fiber, like fresh fruits and vegetables, whole grains, and beans. ? Drink enough fluids to keep your pee (urine) clear or pale  yellow. Activity  Exercise only as told by your doctor. Stop exercising if you start to have cramps.  Avoid heavy lifting, wear low heels, and sit up straight.  Do not exercise if it is too hot, too humid, or if you are in a place of great height (high altitude).  You may continue to have sex unless your doctor tells you not to. Relieving pain and discomfort  Wear a good support bra if your breasts are tender.  Take frequent breaks and rest with your legs raised if you have leg cramps or low back pain.  Take warm water baths (sitz baths) to soothe pain or discomfort caused by hemorrhoids. Use hemorrhoid cream if your doctor approves.  If you develop puffy, bulging veins (varicose veins) in your legs: ? Wear support hose or compression stockings as told by your doctor. ? Raise (elevate) your feet for 15 minutes, 3-4 times a day. ? Limit salt in your food. Safety  Wear your seat belt when driving.  Make a list of emergency phone numbers, including numbers for family, friends, the hospital, and police and fire departments. Preparing for your baby's arrival To prepare for the arrival of your baby:  Take prenatal classes.  Practice driving to the hospital.  Visit the hospital and tour the maternity area.  Talk to your work about taking leave once the baby comes.  Pack your hospital bag.  Prepare the baby's room.  Go to your doctor visits.  Buy a rear-facing car seat. Learn how to install it in your car. General instructions  Do not use hot tubs, steam rooms, or saunas.  Do not use any products that contain nicotine or tobacco, such as cigarettes and e-cigarettes. If you need help quitting, ask your doctor.  Do not drink alcohol.  Do not douche  or use tampons or scented sanitary pads.  Do not cross your legs for long periods of time.  Do not travel for long distances unless you must. Only do so if your doctor says it is okay.  Visit your dentist if you have not  gone during your pregnancy. Use a soft toothbrush to brush your teeth. Be gentle when you floss.  Avoid cat litter boxes and soil used by cats. These carry germs that can cause birth defects in the baby and can cause a loss of your baby (miscarriage) or stillbirth.  Keep all your prenatal visits as told by your doctor. This is important. Contact a doctor if:  You are not sure if you are in labor or if your water has broken.  You are dizzy.  You have mild cramps or pressure in your lower belly.  You have a nagging pain in your belly area.  You continue to feel sick to your stomach, you throw up, or you have watery poop.  You have bad smelling fluid coming from your vagina.  You have pain when you pee. Get help right away if:  You have a fever.  You are leaking fluid from your vagina.  You are spotting or bleeding from your vagina.  You have severe belly cramps or pain.  You lose or gain weight quickly.  You have trouble catching your breath and have chest pain.  You notice sudden or extreme puffiness (swelling) of your face, hands, ankles, feet, or legs.  You have not felt the baby move in over an hour.  You have severe headaches that do not go away with medicine.  You have trouble seeing.  You are leaking, or you are having a gush of fluid, from your vagina before you are 37 weeks.  You have regular belly spasms (contractions) before you are 37 weeks. Summary  The third trimester is from week 28 through week 40 (months 7 through 9). This time is when your unborn baby is growing very fast.  Follow your doctor's advice about medicine, food, and activity.  Get ready for the arrival of your baby by taking prenatal classes, getting all the baby items ready, preparing the baby's room, and visiting your doctor to be checked.  Get help right away if you are bleeding from your vagina, or you have chest pain and trouble catching your breath, or if you have not felt your  baby move in over an hour. This information is not intended to replace advice given to you by your health care provider. Make sure you discuss any questions you have with your health care provider. Document Revised: 01/17/2019 Document Reviewed: 11/01/2016 Elsevier Patient Education  [place]. Common Medications Safe in Pregnancy  Acne:      Constipation:  Benzoyl Peroxide     Colace  Clindamycin      Dulcolax Suppository  Topica Erythromycin     Fibercon  Salicylic Acid      Metamucil         Miralax AVOID:        Senakot   Accutane    Cough:  Retin-A       Cough Drops  Tetracycline      Phenergan w/ Codeine if Rx  Minocycline      Robitussin (Plain & DM)  Antibiotics:     Crabs/Lice:  Ceclor       RID  Cephalosporins    AVOID:  E-Mycins      Kwell  Keflex  Macrobid/Macrodantin   Diarrhea:  Penicillin      Kao-Pectate  Zithromax      Imodium AD         PUSH FLUIDS AVOID:       Cipro     Fever:  Tetracycline      Tylenol (Regular or Extra  Minocycline       Strength)  Levaquin      Extra Strength-Do not          Exceed 8 tabs/24 hrs Caffeine:        '200mg'$ /day (equiv. To 1 cup of coffee or  approx. 3 12 oz sodas)         Gas: Cold/Hayfever:       Gas-X  Benadryl      Mylicon  Claritin       Phazyme  **Claritin-D        Chlor-Trimeton    Headaches:  Dimetapp      ASA-Free Excedrin  Drixoral-Non-Drowsy     Cold Compress  Mucinex (Guaifenasin)     Tylenol (Regular or Extra  Sudafed/Sudafed-12 Hour     Strength)  **Sudafed PE Pseudoephedrine   Tylenol Cold & Sinus     Vicks Vapor Rub  Zyrtec  **AVOID if Problems With Blood Pressure         Heartburn: Avoid lying down for at least 1 hour after meals  Aciphex      Maalox     Rash:  Milk of Magnesia     Benadryl    Mylanta       1% Hydrocortisone Cream  Pepcid  Pepcid Complete   Sleep Aids:  Prevacid      Ambien   Prilosec       Benadryl  Rolaids       Chamomile Tea  Tums (Limit  4/day)     Unisom         Tylenol PM         Warm milk-add vanilla or  Hemorrhoids:       Sugar for taste  Anusol/Anusol H.C.  (RX: Analapram 2.5%)  Sugar Substitutes:  Hydrocortisone OTC     Ok in moderation  Preparation H      Tucks        Vaseline lotion applied to tissue with wiping    Herpes:     Throat:  Acyclovir      Oragel  Famvir  Valtrex     Vaccines:         Flu Shot Leg Cramps:       *Gardasil  Benadryl      Hepatitis A         Hepatitis B Nasal Spray:       Pneumovax  Saline Nasal Spray     Polio Booster         Tetanus Nausea:       Tuberculosis test or PPD  Vitamin B6 25 mg TID   AVOID:    Dramamine      *Gardasil  Emetrol       Live Poliovirus  Ginger Root 250 mg QID    MMR (measles, mumps &  High Complex Carbs @ Bedtime    rebella)  Sea Bands-Accupressure    Varicella (Chickenpox)  Unisom 1/2 tab TID     *No known complications           If received before Pain:         Known pregnancy;   Darvocet  Resume series after  Lortab        Delivery  Percocet    Yeast:   Tramadol      Femstat  Tylenol 3      Gyne-lotrimin  Ultram       Monistat  Vicodin           MISC:         All Sunscreens           Hair Coloring/highlights          Insect Repellant's          (Including DEET)         Mystic Tans     Pain Relief During Labor and Delivery Many things can cause pain during labor and delivery, including:  Pressure on bones and ligaments due to the baby moving through the pelvis.  Stretching of tissues due to the baby moving through the birth canal.  Muscle tension due to anxiety or nervousness.  The uterus tightening (contracting) and relaxing to help move the baby. There are many ways to deal with the pain of labor and delivery. They include:  Taking prenatal classes. Taking these classes helps you know what to expect during your baby's birth. What you learn will increase your confidence and decrease your anxiety.  Practicing relaxation  techniques or doing relaxing activities, such as: ? Focused breathing. ? Meditation. ? Visualization. ? Aroma therapy. ? Listening to your favorite music. ? Hypnosis.  Taking a warm shower or bath (hydrotherapy). This may: ? Provide comfort and relaxation. ? Lessen your perception of pain. ? Decrease the amount of pain medicine needed. ? Decrease the length of labor.  Getting a massage or counterpressure on your back.  Applying warm packs or ice packs.  Changing positions often, moving around, or using a birthing ball.  Getting: ? Pain medicine through an IV or injection into a muscle. ? Pain medicine inserted into your spinal column. ? Injections of sterile water just under the skin on your lower back (intradermal injections). ? Laughing gas (nitrous oxide). Discuss your pain control options with your health care provider during your prenatal visits. Explore the options offered by your hospital or birth center. What kinds of medicine are available? There are two kinds of medicines that can be used to relieve pain during labor and delivery:  Analgesics. These medicines decrease pain without causing you to lose feeling or the ability to move your muscles.  Anesthetics. These medicines block feeling in the body and can decrease your ability to move freely. Both of these kinds of medicine can cause minor side effects, such as nausea, trouble concentrating, and sleepiness. They can also decrease the baby's heart rate before birth and affect the baby's breathing rate after birth. For this reason, health care providers are careful about when and how much medicine is given. What are specific medicines and procedures that provide pain relief? Local Anesthetics Local anesthetics are used to numb a small area of the body. They may be used along with another kind of anesthetic or used to numb the nerves of the vagina, cervix, and perineum during the second stage of labor. General  Anesthetics General anesthetics cause you to lose consciousness so you do not feel pain. They are usually only used for an emergency cesarean delivery. General anesthetics are given through an IV tube and a mask. Pudendal Block A pudendal block is a form of local anesthetic. It may be used to relieve the pain associated with  pushing or stretching of the perineum at the time of delivery or to further numb the perineum. A pudendal block is done by injecting numbing medicine through the vaginal wall into a nerve in the pelvis. Epidural Analgesia Epidural analgesia is given through a flexible IV catheter that is inserted into the lower back. Numbing medicine is delivered continuously to the area near your spinal column nerves (epidural space). After having this type of analgesia, you may be able to move your legs but you most likely will not be able to walk. Depending on the amount of medicine given, you may lose all feeling in the lower half of your body, or you may retain some level of sensation, including the urge to push. Epidural analgesia can be used to provide pain relief for a vaginal birth. Spinal Block A spinal block is similar to epidural analgesia, but the medicine is injected into the spinal fluid instead of the epidural space. A spinal block is only given once. It starts to relieve pain quickly, but the pain relief lasts only 1-6 hours. Spinal blocks can be used for cesarean deliveries. Combined Spinal-Epidural (CSE) Block A CSE block combines the effects of a spinal block and epidural analgesia. The spinal block works quickly to block all pain. The epidural analgesia provides continuous pain relief, even after the effects of the spinal block have worn off. This information is not intended to replace advice given to you by your health care provider. Make sure you discuss any questions you have with your health care provider. Document Revised: 09/08/2017 Document Reviewed: 02/17/2016 Elsevier  Patient Education  [employer].

## 2020-08-05 ENCOUNTER — Encounter: Payer: Self-pay | Admitting: Obstetrics and Gynecology

## 2020-08-05 ENCOUNTER — Ambulatory Visit (INDEPENDENT_AMBULATORY_CARE_PROVIDER_SITE_OTHER): Payer: Medicaid Other | Admitting: Obstetrics and Gynecology

## 2020-08-05 ENCOUNTER — Other Ambulatory Visit: Payer: Self-pay

## 2020-08-05 VITALS — BP 132/85 | HR 90 | Wt 263.4 lb

## 2020-08-05 DIAGNOSIS — Z3A34 34 weeks gestation of pregnancy: Secondary | ICD-10-CM

## 2020-08-05 DIAGNOSIS — Z3403 Encounter for supervision of normal first pregnancy, third trimester: Secondary | ICD-10-CM

## 2020-08-05 NOTE — Progress Notes (Signed)
ROB: No problems.  Active daily movement.  GC/CT-GBS next visit

## 2020-08-09 ENCOUNTER — Other Ambulatory Visit: Payer: Self-pay | Admitting: Dermatology

## 2020-08-09 DIAGNOSIS — L700 Acne vulgaris: Secondary | ICD-10-CM

## 2020-08-10 LAB — POCT URINALYSIS DIPSTICK OB
Bilirubin, UA: NEGATIVE
Blood, UA: NEGATIVE
Glucose, UA: NEGATIVE
Ketones, UA: NEGATIVE
Leukocytes, UA: NEGATIVE
Nitrite, UA: NEGATIVE
POC,PROTEIN,UA: NEGATIVE
Spec Grav, UA: 1.015 (ref 1.010–1.025)
Urobilinogen, UA: 0.2 E.U./dL
pH, UA: 6.5 (ref 5.0–8.0)

## 2020-08-10 MED ORDER — BENZACLIN 1-5 % EX GEL: CUTANEOUS | 4 refills | Status: DC

## 2020-08-10 NOTE — Addendum Note (Signed)
Addended by: Dorian Pod on: 08/10/2020 08:10 AM   Modules accepted: Orders

## 2020-08-14 ENCOUNTER — Telehealth: Payer: Self-pay

## 2020-08-14 ENCOUNTER — Other Ambulatory Visit: Payer: Self-pay

## 2020-08-14 ENCOUNTER — Encounter: Payer: Self-pay | Admitting: Obstetrics and Gynecology

## 2020-08-14 ENCOUNTER — Observation Stay
Admission: EM | Admit: 2020-08-14 | Discharge: 2020-08-14 | Disposition: A | Discharge status: Home / Self Care | Payer: Medicaid Other | Attending: Obstetrics and Gynecology | Admitting: Obstetrics and Gynecology

## 2020-08-14 DIAGNOSIS — Z3A36 36 weeks gestation of pregnancy: Secondary | ICD-10-CM | POA: Diagnosis not present

## 2020-08-14 DIAGNOSIS — O368130 Decreased fetal movements, third trimester, not applicable or unspecified: Secondary | ICD-10-CM | POA: Diagnosis not present

## 2020-08-14 DIAGNOSIS — O368131 Decreased fetal movements, third trimester, fetus 1: Secondary | ICD-10-CM | POA: Diagnosis present

## 2020-08-14 HISTORY — DX: Anemia, unspecified: D64.9

## 2020-08-14 NOTE — Telephone Encounter (Signed)
Pt called in and said that she hasn't felt her baby move since this morning another 1 am. The pt said that she drank Orange juice and put something cold on her belly . The pt is requesting a call back. Please advise

## 2020-08-14 NOTE — Progress Notes (Signed)
Discharge home. Follow up appointment already made for Tuesday with Dr. Valentino Saxon. Left floor ambulatory. Sandy Burns

## 2020-08-14 NOTE — OB Triage Note (Signed)
Pt complains of decreased fetal movement Wednesday and today. Reports "normal" fetal movement Thursday. Elaina Hoops

## 2020-08-14 NOTE — Telephone Encounter (Signed)
Per Dr. Logan Bores I have sent the patient to labor and delivery to be checked out.

## 2020-08-18 ENCOUNTER — Encounter: Payer: Self-pay | Admitting: Obstetrics and Gynecology

## 2020-08-18 ENCOUNTER — Other Ambulatory Visit: Payer: Self-pay

## 2020-08-18 ENCOUNTER — Encounter: Payer: Medicaid Other | Admitting: Obstetrics and Gynecology

## 2020-08-18 ENCOUNTER — Ambulatory Visit (INDEPENDENT_AMBULATORY_CARE_PROVIDER_SITE_OTHER): Payer: Medicaid Other | Admitting: Obstetrics and Gynecology

## 2020-08-18 VITALS — BP 144/89 | HR 114 | Wt 267.3 lb

## 2020-08-18 DIAGNOSIS — Z3685 Encounter for antenatal screening for Streptococcus B: Secondary | ICD-10-CM

## 2020-08-18 DIAGNOSIS — R0989 Other specified symptoms and signs involving the circulatory and respiratory systems: Secondary | ICD-10-CM

## 2020-08-18 DIAGNOSIS — O99513 Diseases of the respiratory system complicating pregnancy, third trimester: Secondary | ICD-10-CM

## 2020-08-18 DIAGNOSIS — F410 Panic disorder [episodic paroxysmal anxiety] without agoraphobia: Secondary | ICD-10-CM

## 2020-08-18 DIAGNOSIS — Z3403 Encounter for supervision of normal first pregnancy, third trimester: Secondary | ICD-10-CM

## 2020-08-18 DIAGNOSIS — Z3A35 35 weeks gestation of pregnancy: Secondary | ICD-10-CM

## 2020-08-18 DIAGNOSIS — J45909 Unspecified asthma, uncomplicated: Secondary | ICD-10-CM | POA: Insufficient documentation

## 2020-08-18 LAB — POCT URINALYSIS DIPSTICK OB
Bilirubin, UA: NEGATIVE
Blood, UA: NEGATIVE
Glucose, UA: NEGATIVE
Ketones, UA: NEGATIVE
Leukocytes, UA: NEGATIVE
Nitrite, UA: NEGATIVE
Spec Grav, UA: 1.025 (ref 1.010–1.025)
Urobilinogen, UA: 0.2 E.U./dL
pH, UA: 6.5 (ref 5.0–8.0)

## 2020-08-18 MED ORDER — AZITHROMYCIN 250 MG PO TABS: ORAL_TABLET | ORAL | 0 refills | Status: DC

## 2020-08-18 NOTE — Progress Notes (Signed)
ROB: Patient complains of upper respiratory symptoms (stuffy nose with nasal congestion, mild cough).  Notes she was also having difficulty breathing earlier due to having a panic attack while in the waiting room. She is using her albuterol inhaler and Advair regularly. Lungs clear on exam. BPs elevated today with +1 proteinuria and mild ankle swelling, but patient admits to taking Sudafed earlier today. Will f/u BPs at next visit. Discussed continuation of OTC meds for URI symptoms. 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 Discussed viral vs bacterial etiology. If patient develops fever or shows other signs of infection (nasal discharge color change or odor, fever, worsening breathing), will give Z-pack.  Otherwise, if viral just will need to continuos supportive measures. Should consider treatment with Vistaril or Buspar if anxiety/panic attacks become more frequent.  36 week labs done today. RTC in 1 week.

## 2020-08-18 NOTE — Progress Notes (Signed)
ROB-Pt present for routine prenatal care and 36 week cultures. Pt c/o of SOB and coughing.

## 2020-08-18 NOTE — Patient Instructions (Signed)

## 2020-08-19 DIAGNOSIS — Z3403 Encounter for supervision of normal first pregnancy, third trimester: Secondary | ICD-10-CM | POA: Diagnosis not present

## 2020-08-19 NOTE — Discharge Summary (Signed)
    L&D OB Triage Note  SUBJECTIVE Sandy Burns is a 19 y.o. G1P0000 female at [redacted]w[redacted]d, EDD Estimated Date of Delivery: 09/16/20 who presented to triage with complaints of decreased fetal movement she explains that some days the baby moves less than others.  Denies leakage of fluid, vaginal bleeding.  OB History  Gravida Para Term Preterm AB Living  1 0 0 0 0 0  SAB TAB Ectopic Multiple Live Births  0 0 0 0 0    # Outcome Date GA Lbr Len/2nd Weight Sex Delivery Anes PTL Lv  1 Current             No medications prior to admission.     OBJECTIVE  Nursing Evaluation:   BP 128/71 (BP Location: Right Arm)   Pulse 87   Temp 98.2 F (36.8 C) (Oral)   Resp 18   Ht 5\' 7"  (1.702 m)   Wt 117.9 kg   LMP 12/11/2019 (LMP Unknown)   BMI 40.72 kg/m    Findings:        Not in labor      NST was performed and has been reviewed by me.  NST INTERPRETATION: Category I  Mode: External Baseline Rate (A): 125 bpm Variability: Moderate Accelerations: 15 x 15 Decelerations: None     Contraction Frequency (min): none  ASSESSMENT Impression:  1.  Pregnancy:  G1P0000 at [redacted]w[redacted]d , EDD Estimated Date of Delivery: 09/16/20 2.  Reassuring fetal and maternal status   PLAN 1. Discussed current condition and above findings with patient and reassurance given.  All questions answered. 2. Discharge home with standard labor precautions given to return to L&D or call the office for problems. 3. Continue routine prenatal care.

## 2020-08-21 LAB — STREP GP B NAA: Strep Gp B NAA: NEGATIVE

## 2020-08-22 ENCOUNTER — Observation Stay
Admission: EM | Admit: 2020-08-22 | Discharge: 2020-08-22 | Disposition: A | Discharge status: Home / Self Care | Payer: Medicaid Other | Attending: Obstetrics and Gynecology | Admitting: Obstetrics and Gynecology

## 2020-08-22 ENCOUNTER — Encounter: Payer: Self-pay | Admitting: Obstetrics and Gynecology

## 2020-08-22 ENCOUNTER — Other Ambulatory Visit: Payer: Self-pay

## 2020-08-22 DIAGNOSIS — Z3490 Encounter for supervision of normal pregnancy, unspecified, unspecified trimester: Secondary | ICD-10-CM

## 2020-08-22 DIAGNOSIS — Z3A36 36 weeks gestation of pregnancy: Secondary | ICD-10-CM | POA: Diagnosis not present

## 2020-08-22 DIAGNOSIS — O26893 Other specified pregnancy related conditions, third trimester: Secondary | ICD-10-CM | POA: Diagnosis present

## 2020-08-22 DIAGNOSIS — R1030 Lower abdominal pain, unspecified: Secondary | ICD-10-CM

## 2020-08-22 LAB — URINALYSIS, ROUTINE W REFLEX MICROSCOPIC
Bilirubin Urine: NEGATIVE
Glucose, UA: NEGATIVE mg/dL
Hgb urine dipstick: NEGATIVE
Ketones, ur: NEGATIVE mg/dL
Nitrite: NEGATIVE
Protein, ur: 30 mg/dL — AB
Specific Gravity, Urine: 1.017 (ref 1.005–1.030)
pH: 6 (ref 5.0–8.0)

## 2020-08-22 LAB — GC/CHLAMYDIA PROBE AMP
Chlamydia trachomatis, NAA: NEGATIVE
Neisseria Gonorrhoeae by PCR: NEGATIVE

## 2020-08-22 MED ORDER — ACETAMINOPHEN 325 MG PO TABS
ORAL_TABLET | ORAL | Status: AC
  Administered 2020-08-22: 650 mg via ORAL
  Filled 2020-08-22: qty 2

## 2020-08-22 MED ORDER — ACETAMINOPHEN 325 MG PO TABS: 650.0000 mg | ORAL_TABLET | Freq: Four times a day (QID) | ORAL | Status: DC | PRN

## 2020-08-22 NOTE — OB Triage Note (Signed)
Pt presents to L/D triage with reported constant lower abdominal pain that began today at 1100. She rates the pain 7/10. The pain worsens with activity and movement. Initial FHT 135. She reports positive but decreased fetal movement today, but movement felt since drinking gatorade. She reports no bleeding, LOF, or urinary symptoms. Pt. reports no recent intercourse.  She has been on Zithromax prescribed by her OB office since 11/13 for self-reported "RSV". She reports a recent cough, sore throat, and runny nose with a decrease in symptoms today.  VSS. Monitors applied and assessing. Patient alert and oriented at this time.

## 2020-08-22 NOTE — Progress Notes (Incomplete)
.  DISCHARGEOB

## 2020-08-22 NOTE — OB Triage Note (Signed)
Abdominal binder applied per order. Discharge instructions and pain management reviewed. Red flag labor symptoms reviewed and pt verbalized understanding. All questions answered. Pt stable at time of discharge.

## 2020-08-24 NOTE — Final Progress Note (Signed)
L&D OB Triage Note  Sandy Burns is a 19 y.o. G1P0000 female at [redacted]w[redacted]d, EDD Estimated Date of Delivery: 09/16/20 who presented to triage for complaints of lower abdominal pain that started around 1100 this morning, pain rated 7/10. Denied vaginal bleeding or leaking fluids. Did endorse decreased fetal movement earlier this morning but after drinking Gatorade the movement improved.  She was evaluated by the nurses with no significant findings for preterm contractions or labor. Vital signs stable. An NST was performed and has been reviewed by MD. She was treated with PO Tylenol 650 mg x 1 dose, and adequate hydration.   NST INTERPRETATION: Indications: rule out uterine contractions  Mode: External Baseline Rate (A): 120 bpm Variability: Moderate Accelerations: 15 x 15 Decelerations: None     Contraction Frequency (min): UI  Impression: reactive    Labs:  Results for orders placed or performed during the hospital encounter of 08/22/20  Urinalysis, Routine w reflex microscopic Urine, Clean Catch  Result Value Ref Range   Color, Urine YELLOW (A) YELLOW   APPearance HAZY (A) CLEAR   Specific Gravity, Urine 1.017 1.005 - 1.030   pH 6.0 5.0 - 8.0   Glucose, UA NEGATIVE NEGATIVE mg/dL   Hgb urine dipstick NEGATIVE NEGATIVE   Bilirubin Urine NEGATIVE NEGATIVE   Ketones, ur NEGATIVE NEGATIVE mg/dL   Protein, ur 30 (A) NEGATIVE mg/dL   Nitrite NEGATIVE NEGATIVE   Leukocytes,Ua TRACE (A) NEGATIVE   RBC / HPF 0-5 0 - 5 RBC/hpf   WBC, UA 0-5 0 - 5 WBC/hpf   Bacteria, UA MANY (A) NONE SEEN   Squamous Epithelial / LPF 0-5 0 - 5   Mucus PRESENT     Plan: NST performed was reviewed and was found to be reactive. Her pain improved with the Tylenol and administration of an abdominal binder. She was discharged home with bleeding/labor precautions.  Continue routine prenatal care. Follow up with OB/GYN as previously scheduled.     Hildred Laser, MD  [facility]

## 2020-08-25 ENCOUNTER — Encounter: Payer: Self-pay | Admitting: Obstetrics and Gynecology

## 2020-08-25 ENCOUNTER — Other Ambulatory Visit: Payer: Self-pay

## 2020-08-25 ENCOUNTER — Ambulatory Visit (INDEPENDENT_AMBULATORY_CARE_PROVIDER_SITE_OTHER): Payer: Medicaid Other | Admitting: Obstetrics and Gynecology

## 2020-08-25 VITALS — BP 130/83 | HR 102 | Wt 266.1 lb

## 2020-08-25 DIAGNOSIS — Z3A36 36 weeks gestation of pregnancy: Secondary | ICD-10-CM

## 2020-08-25 DIAGNOSIS — Z3403 Encounter for supervision of normal first pregnancy, third trimester: Secondary | ICD-10-CM

## 2020-08-25 LAB — POCT URINALYSIS DIPSTICK OB
Bilirubin, UA: NEGATIVE
Blood, UA: NEGATIVE
Glucose, UA: NEGATIVE
Ketones, UA: NEGATIVE
Leukocytes, UA: NEGATIVE
Nitrite, UA: NEGATIVE
Spec Grav, UA: 1.01 (ref 1.010–1.025)
Urobilinogen, UA: 0.2 E.U./dL
pH, UA: 6.5 (ref 5.0–8.0)

## 2020-08-25 NOTE — Progress Notes (Signed)
ROB: Patient complained of headache this morning with some blurry vision.  Reports that this has improved.  Did not try Tylenol.  Patient without weight gain.  Blood pressure actually improved from last visit.  Patient using belly band which is helping with her abdominal pain.  Strongly doubt preeclampsia.  Signs and symptoms of labor, signs and symptoms of preeclampsia discussed.  Patient no longer complaining of URI symptoms.

## 2020-08-26 ENCOUNTER — Encounter: Payer: Medicaid Other | Admitting: Obstetrics and Gynecology

## 2020-08-31 ENCOUNTER — Other Ambulatory Visit: Payer: Self-pay

## 2020-08-31 MED ORDER — PROMETHAZINE HCL 25 MG PO TABS
25.0000 mg | ORAL_TABLET | Freq: Four times a day (QID) | ORAL | 0 refills | Status: DC | PRN
Start: 2020-08-31 — End: 2020-09-07

## 2020-09-03 ENCOUNTER — Observation Stay
Admission: EM | Admit: 2020-09-03 | Discharge: 2020-09-04 | Disposition: A | Discharge status: Home / Self Care | Payer: Medicaid Other | Attending: Obstetrics and Gynecology | Admitting: Obstetrics and Gynecology

## 2020-09-03 DIAGNOSIS — O368130 Decreased fetal movements, third trimester, not applicable or unspecified: Principal | ICD-10-CM | POA: Insufficient documentation

## 2020-09-03 DIAGNOSIS — Z3A38 38 weeks gestation of pregnancy: Secondary | ICD-10-CM | POA: Diagnosis not present

## 2020-09-03 NOTE — OB Triage Note (Signed)
Pt is a G1P0 who arrives to birthplace with c/o probable fluid leaking. Pt uses [facility] for care and reports decreased fetal movement today around 1600-1700. Pt reports last check up on Tuesday 08/25/2020.

## 2020-09-04 DIAGNOSIS — O368130 Decreased fetal movements, third trimester, not applicable or unspecified: Secondary | ICD-10-CM

## 2020-09-04 LAB — RUPTURE OF MEMBRANE (ROM)PLUS: Rom Plus: NEGATIVE

## 2020-09-04 NOTE — Progress Notes (Signed)
Pt to be d/c home and to self care. Pt given teaching on when to seek medical attention (LOF, VB and decreased FM, etc..). Pt given information regarding fetal kick counts. Pt verbalized understanding of d/c instructions.

## 2020-09-05 ENCOUNTER — Inpatient Hospital Stay: Payer: Medicaid Other | Admitting: Anesthesiology

## 2020-09-05 ENCOUNTER — Other Ambulatory Visit: Payer: Self-pay

## 2020-09-05 ENCOUNTER — Encounter: Payer: Self-pay | Admitting: Obstetrics and Gynecology

## 2020-09-05 ENCOUNTER — Inpatient Hospital Stay
Admission: EM | Admit: 2020-09-05 | Discharge: 2020-09-07 | DRG: 806 | Disposition: A | Discharge status: Home / Self Care | Payer: Medicaid Other | Attending: Obstetrics and Gynecology | Admitting: Obstetrics and Gynecology

## 2020-09-05 DIAGNOSIS — O9081 Anemia of the puerperium: Secondary | ICD-10-CM | POA: Diagnosis not present

## 2020-09-05 DIAGNOSIS — Z3A38 38 weeks gestation of pregnancy: Secondary | ICD-10-CM | POA: Diagnosis not present

## 2020-09-05 DIAGNOSIS — O99891 Other specified diseases and conditions complicating pregnancy: Secondary | ICD-10-CM

## 2020-09-05 DIAGNOSIS — J45909 Unspecified asthma, uncomplicated: Secondary | ICD-10-CM | POA: Diagnosis present

## 2020-09-05 DIAGNOSIS — D62 Acute posthemorrhagic anemia: Secondary | ICD-10-CM | POA: Diagnosis not present

## 2020-09-05 DIAGNOSIS — Z20822 Contact with and (suspected) exposure to covid-19: Secondary | ICD-10-CM | POA: Diagnosis present

## 2020-09-05 DIAGNOSIS — O1404 Mild to moderate pre-eclampsia, complicating childbirth: Secondary | ICD-10-CM | POA: Diagnosis present

## 2020-09-05 DIAGNOSIS — N898 Other specified noninflammatory disorders of vagina: Secondary | ICD-10-CM | POA: Diagnosis not present

## 2020-09-05 DIAGNOSIS — O99214 Obesity complicating childbirth: Secondary | ICD-10-CM | POA: Diagnosis present

## 2020-09-05 DIAGNOSIS — R000 Tachycardia, unspecified: Secondary | ICD-10-CM | POA: Diagnosis present

## 2020-09-05 DIAGNOSIS — O99893 Other specified diseases and conditions complicating puerperium: Secondary | ICD-10-CM | POA: Diagnosis present

## 2020-09-05 DIAGNOSIS — O9952 Diseases of the respiratory system complicating childbirth: Secondary | ICD-10-CM | POA: Diagnosis present

## 2020-09-05 DIAGNOSIS — O1403 Mild to moderate pre-eclampsia, third trimester: Secondary | ICD-10-CM

## 2020-09-05 DIAGNOSIS — O99210 Obesity complicating pregnancy, unspecified trimester: Secondary | ICD-10-CM | POA: Diagnosis present

## 2020-09-05 DIAGNOSIS — D649 Anemia, unspecified: Secondary | ICD-10-CM | POA: Diagnosis not present

## 2020-09-05 DIAGNOSIS — O99513 Diseases of the respiratory system complicating pregnancy, third trimester: Secondary | ICD-10-CM | POA: Diagnosis present

## 2020-09-05 LAB — CBC
HCT: 33.9 % — ABNORMAL LOW (ref 36.0–46.0)
Hemoglobin: 11.8 g/dL — ABNORMAL LOW (ref 12.0–15.0)
MCH: 32.8 pg (ref 26.0–34.0)
MCHC: 34.8 g/dL (ref 30.0–36.0)
MCV: 94.2 fL (ref 80.0–100.0)
Platelets: 172 10*3/uL (ref 150–400)
RBC: 3.6 MIL/uL — ABNORMAL LOW (ref 3.87–5.11)
RDW: 12.5 % (ref 11.5–15.5)
WBC: 6.5 10*3/uL (ref 4.0–10.5)
nRBC: 0 % (ref 0.0–0.2)

## 2020-09-05 LAB — COMPREHENSIVE METABOLIC PANEL
ALT: 10 U/L (ref 0–44)
AST: 16 U/L (ref 15–41)
Albumin: 2.6 g/dL — ABNORMAL LOW (ref 3.5–5.0)
Alkaline Phosphatase: 231 U/L — ABNORMAL HIGH (ref 38–126)
Anion gap: 7 (ref 5–15)
BUN: 11 mg/dL (ref 6–20)
CO2: 24 mmol/L (ref 22–32)
Calcium: 9.2 mg/dL (ref 8.9–10.3)
Chloride: 104 mmol/L (ref 98–111)
Creatinine, Ser: 0.71 mg/dL (ref 0.44–1.00)
GFR, Estimated: >60 mL/min (ref >60)
Glucose, Bld: 82 mg/dL (ref 70–99)
Potassium: 4.6 mmol/L (ref 3.5–5.1)
Sodium: 135 mmol/L (ref 135–145)
Total Bilirubin: 0.5 mg/dL (ref 0.3–1.2)
Total Protein: 6.2 g/dL — ABNORMAL LOW (ref 6.5–8.1)

## 2020-09-05 LAB — PROTEIN / CREATININE RATIO, URINE
Creatinine, Urine: 88 mg/dL
Protein Creatinine Ratio: 3.39 mg/mg{Cre} — ABNORMAL HIGH (ref 0.00–0.15)
Total Protein, Urine: 298 mg/dL

## 2020-09-05 LAB — CHLAMYDIA/NGC RT PCR (ARMC ONLY)
Chlamydia Tr: NOT DETECTED
N gonorrhoeae: NOT DETECTED

## 2020-09-05 LAB — RESP PANEL BY RT-PCR (FLU A&B, COVID) ARPGX2
Influenza A by PCR: NEGATIVE
Influenza B by PCR: NEGATIVE
SARS Coronavirus 2 by RT PCR: NEGATIVE

## 2020-09-05 LAB — ABO/RH: ABO/RH(D): A POS

## 2020-09-05 MED ORDER — DIPHENHYDRAMINE HCL 25 MG PO CAPS: 25.0000 mg | ORAL_CAPSULE | Freq: Four times a day (QID) | ORAL | Status: DC | PRN

## 2020-09-05 MED ORDER — OXYCODONE-ACETAMINOPHEN 5-325 MG PO TABS: 1.0000 | ORAL_TABLET | ORAL | Status: DC | PRN

## 2020-09-05 MED ORDER — TERBUTALINE SULFATE 1 MG/ML IJ SOLN: 0.2500 mg | Freq: Once | INTRAMUSCULAR | Status: DC | PRN

## 2020-09-05 MED ORDER — LACTATED RINGERS IV SOLN: 500.0000 mL | Freq: Once | INTRAVENOUS | Status: DC

## 2020-09-05 MED ORDER — ACETAMINOPHEN 325 MG PO TABS: 650.0000 mg | ORAL_TABLET | ORAL | Status: DC | PRN

## 2020-09-05 MED ORDER — LABETALOL HCL 5 MG/ML IV SOLN: 40.0000 mg | INTRAVENOUS | Status: DC | PRN

## 2020-09-05 MED ORDER — EPHEDRINE 5 MG/ML INJ: 10.0000 mg | INTRAVENOUS | Status: DC | PRN

## 2020-09-05 MED ORDER — IBUPROFEN 600 MG PO TABS
600.0000 mg | ORAL_TABLET | Freq: Four times a day (QID) | ORAL | Status: DC
  Administered 2020-09-05 – 2020-09-06 (×2): 600 mg via ORAL
  Filled 2020-09-05 (×2): qty 1

## 2020-09-05 MED ORDER — SOD CITRATE-CITRIC ACID 500-334 MG/5ML PO SOLN: 30.0000 mL | ORAL | Status: DC | PRN

## 2020-09-05 MED ORDER — ACETAMINOPHEN 325 MG PO TABS
650.0000 mg | ORAL_TABLET | ORAL | Status: DC | PRN
  Administered 2020-09-05: 650 mg via ORAL
  Filled 2020-09-05: qty 2

## 2020-09-05 MED ORDER — OXYCODONE-ACETAMINOPHEN 5-325 MG PO TABS: 2.0000 | ORAL_TABLET | ORAL | Status: DC | PRN

## 2020-09-05 MED ORDER — METHYLPHENIDATE HCL ER 10 MG PO TBCR
20.0000 mg | EXTENDED_RELEASE_TABLET | Freq: Every morning | ORAL | Status: DC
  Filled 2020-09-05: qty 2

## 2020-09-05 MED ORDER — OXYTOCIN-SODIUM CHLORIDE 30-0.9 UT/500ML-% IV SOLN
2.5000 [IU]/h | INTRAVENOUS | Status: DC
  Administered 2020-09-05: 2.5 [IU]/h via INTRAVENOUS

## 2020-09-05 MED ORDER — OXYTOCIN-SODIUM CHLORIDE 30-0.9 UT/500ML-% IV SOLN
INTRAVENOUS | Status: AC
  Administered 2020-09-06: 2.5 [IU]/h via INTRAVENOUS
  Filled 2020-09-05: qty 500

## 2020-09-05 MED ORDER — PHENYLEPHRINE 40 MCG/ML (10ML) SYRINGE FOR IV PUSH (FOR BLOOD PRESSURE SUPPORT): 80.0000 ug | PREFILLED_SYRINGE | INTRAVENOUS | Status: DC | PRN

## 2020-09-05 MED ORDER — DIBUCAINE (PERIANAL) 1 % EX OINT: 1.0000 "application " | TOPICAL_OINTMENT | CUTANEOUS | Status: DC | PRN

## 2020-09-05 MED ORDER — OXYTOCIN 10 UNIT/ML IJ SOLN
INTRAMUSCULAR | Status: AC
  Filled 2020-09-05: qty 2

## 2020-09-05 MED ORDER — ONDANSETRON HCL 4 MG PO TABS: 4.0000 mg | ORAL_TABLET | ORAL | Status: DC | PRN

## 2020-09-05 MED ORDER — OXYTOCIN BOLUS FROM INFUSION: 333.0000 mL | Freq: Once | INTRAVENOUS | Status: AC

## 2020-09-05 MED ORDER — NIFEDIPINE 10 MG PO CAPS
10.0000 mg | ORAL_CAPSULE | ORAL | Status: DC | PRN
  Filled 2020-09-05: qty 1

## 2020-09-05 MED ORDER — LIDOCAINE HCL (PF) 1 % IJ SOLN
INTRAMUSCULAR | Status: AC
  Administered 2020-09-05: 30 mL via SUBCUTANEOUS
  Filled 2020-09-05: qty 30

## 2020-09-05 MED ORDER — NIFEDIPINE 10 MG PO CAPS
20.0000 mg | ORAL_CAPSULE | ORAL | Status: DC | PRN
  Filled 2020-09-05: qty 2

## 2020-09-05 MED ORDER — MISOPROSTOL 50MCG HALF TABLET
50.0000 ug | ORAL_TABLET | ORAL | Status: DC | PRN
  Administered 2020-09-05: 50 ug via VAGINAL
  Filled 2020-09-05 (×2): qty 1

## 2020-09-05 MED ORDER — ONDANSETRON HCL 4 MG/2ML IJ SOLN: 4.0000 mg | INTRAMUSCULAR | Status: DC | PRN

## 2020-09-05 MED ORDER — ONDANSETRON HCL 4 MG/2ML IJ SOLN
4.0000 mg | Freq: Four times a day (QID) | INTRAMUSCULAR | Status: DC | PRN
  Administered 2020-09-05: 4 mg via INTRAVENOUS
  Filled 2020-09-05: qty 2

## 2020-09-05 MED ORDER — WITCH HAZEL-GLYCERIN EX PADS: 1.0000 "application " | MEDICATED_PAD | CUTANEOUS | Status: DC | PRN

## 2020-09-05 MED ORDER — MOMETASONE FURO-FORMOTEROL FUM 100-5 MCG/ACT IN AERO
2.0000 | INHALATION_SPRAY | Freq: Two times a day (BID) | RESPIRATORY_TRACT | Status: DC
  Administered 2020-09-05 – 2020-09-06 (×3): 2 via RESPIRATORY_TRACT
  Filled 2020-09-05: qty 8.8

## 2020-09-05 MED ORDER — ZOLPIDEM TARTRATE 5 MG PO TABS: 5.0000 mg | ORAL_TABLET | Freq: Every evening | ORAL | Status: DC | PRN

## 2020-09-05 MED ORDER — SODIUM CHLORIDE 0.9 % IV SOLN
INTRAVENOUS | Status: DC | PRN
  Administered 2020-09-05: 10 mL via EPIDURAL

## 2020-09-05 MED ORDER — LIDOCAINE-EPINEPHRINE (PF) 1.5 %-1:200000 IJ SOLN
INTRAMUSCULAR | Status: DC | PRN
  Administered 2020-09-05: 3 mL via EPIDURAL

## 2020-09-05 MED ORDER — LACTATED RINGERS IV SOLN: 500.0000 mL | INTRAVENOUS | Status: DC | PRN

## 2020-09-05 MED ORDER — MISOPROSTOL 200 MCG PO TABS
ORAL_TABLET | ORAL | Status: AC
  Administered 2020-09-05: 50 ug via VAGINAL
  Filled 2020-09-05: qty 4

## 2020-09-05 MED ORDER — FENTANYL 2.5 MCG/ML W/ROPIVACAINE 0.15% IN NS 100 ML EPIDURAL (ARMC)
EPIDURAL | Status: AC
  Filled 2020-09-05: qty 100

## 2020-09-05 MED ORDER — PRENATAL MULTIVITAMIN CH
1.0000 | ORAL_TABLET | Freq: Every day | ORAL | Status: DC
  Administered 2020-09-06 – 2020-09-07 (×2): 1 via ORAL
  Filled 2020-09-05 (×2): qty 1

## 2020-09-05 MED ORDER — SIMETHICONE 80 MG PO CHEW
80.0000 mg | CHEWABLE_TABLET | ORAL | Status: DC | PRN
  Administered 2020-09-06: 80 mg via ORAL
  Filled 2020-09-05: qty 1

## 2020-09-05 MED ORDER — BENZOCAINE-MENTHOL 20-0.5 % EX AERO
1.0000 "application " | INHALATION_SPRAY | CUTANEOUS | Status: DC | PRN
  Administered 2020-09-06: 1 via TOPICAL
  Filled 2020-09-05: qty 56

## 2020-09-05 MED ORDER — TETANUS-DIPHTH-ACELL PERTUSSIS 5-2.5-18.5 LF-MCG/0.5 IM SUSY: 0.5000 mL | PREFILLED_SYRINGE | Freq: Once | INTRAMUSCULAR | Status: DC

## 2020-09-05 MED ORDER — LACTATED RINGERS IV SOLN: INTRAVENOUS | Status: DC

## 2020-09-05 MED ORDER — OXYTOCIN-SODIUM CHLORIDE 30-0.9 UT/500ML-% IV SOLN
INTRAVENOUS | Status: AC
  Administered 2020-09-05: 333 mL via INTRAVENOUS
  Filled 2020-09-05: qty 500

## 2020-09-05 MED ORDER — DIPHENHYDRAMINE HCL 50 MG/ML IJ SOLN: 12.5000 mg | INTRAMUSCULAR | Status: DC | PRN

## 2020-09-05 MED ORDER — FENTANYL 2.5 MCG/ML W/ROPIVACAINE 0.15% IN NS 100 ML EPIDURAL (ARMC): 12.0000 mL/h | EPIDURAL | Status: DC

## 2020-09-05 MED ORDER — BUTORPHANOL TARTRATE 1 MG/ML IJ SOLN
1.0000 mg | INTRAMUSCULAR | Status: DC | PRN
  Administered 2020-09-05: 1 mg via INTRAVENOUS
  Filled 2020-09-05: qty 1

## 2020-09-05 MED ORDER — FENTANYL 2.5 MCG/ML W/ROPIVACAINE 0.15% IN NS 100 ML EPIDURAL (ARMC)
EPIDURAL | Status: DC | PRN
  Administered 2020-09-05: 12 mL/h via EPIDURAL

## 2020-09-05 MED ORDER — SENNOSIDES-DOCUSATE SODIUM 8.6-50 MG PO TABS
2.0000 | ORAL_TABLET | ORAL | Status: DC
  Administered 2020-09-06 – 2020-09-07 (×2): 2 via ORAL
  Filled 2020-09-05 (×2): qty 2

## 2020-09-05 MED ORDER — LIDOCAINE HCL (PF) 1 % IJ SOLN
INTRAMUSCULAR | Status: DC | PRN
  Administered 2020-09-05: 3 mL
  Administered 2020-09-05: 2 mL

## 2020-09-05 MED ORDER — COCONUT OIL OIL
1.0000 "application " | TOPICAL_OIL | Status: DC | PRN
  Administered 2020-09-06: 1 via TOPICAL
  Filled 2020-09-05: qty 120

## 2020-09-05 MED ORDER — SODIUM CHLORIDE (PF) 0.9 % IJ SOLN
INTRAMUSCULAR | Status: AC
  Filled 2020-09-05: qty 50

## 2020-09-05 MED ORDER — AMMONIA AROMATIC IN INHA
RESPIRATORY_TRACT | Status: AC
  Filled 2020-09-05: qty 10

## 2020-09-05 MED ORDER — LIDOCAINE HCL (PF) 1 % IJ SOLN: 30.0000 mL | INTRAMUSCULAR | Status: AC | PRN

## 2020-09-05 NOTE — Discharge Summary (Signed)
L&D OB Triage Note  Sandy Burns is a 19 y.o. G1P0000 female at [redacted]w[redacted]d, EDD Estimated Date of Delivery: 09/16/20 who presented to triage for complaints of possible ruptured membranes.  She is also complaining of some decreased fetal movement.  She was evaluated by the nurses with no significant findings for ruptured membranes. Vital signs stable. An NST was performed and has been reviewed by MD.  NST INTERPRETATION: Indications: decreased fetal movement and rule out uterine contractions  Mode: External Baseline Rate (A): 125 bpm Variability: Moderate Accelerations: 15 x 15 Decelerations: rare shallow variable deceleration     Contraction Frequency (min): 4-occ  Impression: reactive   Plan: NST performed was reviewed and was found to be reactive. She was discharged home with bleeding/labor precautions. Also counseled on fetal kick counts. Continue routine prenatal care. Follow up with OB/GYN as previously scheduled.     Hildred Laser, MD  [facility]

## 2020-09-05 NOTE — Progress Notes (Signed)
Intrapartum Progress Note  S: Patient complaining of pain with contractions, despite receiving a dose of Stadol.   O:  Vitals:   09/05/20 1119 09/05/20 1324 09/05/20 1414 09/05/20 1534  BP: 140/88 (!) 153/106 (!) 155/77 (!) 147/92  Pulse: 91 (!) 104 94 (!) 103  Resp:      Temp: 97.7 F (36.5 C)   97.8 F (36.6 C)  TempSrc: Oral   Oral  Weight:      Height:        Gen App: NAD, comfortable Abdomen: soft, gravid FHT: baseline 140 bpm.  Accels present.  Decels absent. moderate in degree variability.   Tocometer: contractions irregular, difficult to detect with toco and body habitus but appears every 2-3 minutes Cervix: 6/70/-2/BBOB Extremities: Nontender, no edema.  Pitocin: None  Labs: No new labs  Assessment:  1: SIUP at [redacted]w[redacted]d 2. Mild pre-eclampsia at term 3. H/o asthma  Plan:  1. Foley bulb expelled at ~ 3:30 pm.  Contractions still difficult to trace but detected by patient almost every 2 minutes. Unintentional AROM with cervical exam, clear fluid.  2. Mild pre-eclampsia, with some borderline severe range BPs. If remains significantly elevated, will start magnesium sulfate. Nifedipine for severe range BPs if needed.  3. H/o asthma, avoid beta blockers/Hemabate.  Currently asymptomatic.    Hildred Laser, MD 09/05/2020 5:18 PM

## 2020-09-05 NOTE — OB Triage Note (Signed)
Pt is a G1P0 and [redacted]w[redacted]d presenting to L&d with c/o bleeding at 0200 this morning when she went to the bathroom. She states there was blood in the toilet and a golf ball size amount on the toilet paper. Pt states there has been no more bleeding since episode. Pt states he didn't move as much today but confirms positive fetal movement. Pt denies LOF. Monitors applied and assessing. VSS.

## 2020-09-05 NOTE — Anesthesia Preprocedure Evaluation (Signed)
Anesthesia Evaluation  Patient identified by MRN, date of birth, ID band Patient awake  General Assessment Comment:Pre eclampsia  Reviewed: Allergy & Precautions, NPO status , Patient's Chart, lab work & pertinent test results  History of Anesthesia Complications Negative for: history of anesthetic complications  Airway Mallampati: III  TM Distance: >3 FB Neck ROM: Full   Comment: One upper lip frenulum ring, one tongue ring Dental no notable dental hx. (+) Teeth Intact   Pulmonary neg pulmonary ROS, asthma , neg sleep apnea, neg COPD, Patient abstained from smoking.Not current smoker,    Pulmonary exam normal breath sounds clear to auscultation       Cardiovascular Exercise Tolerance: Good METShypertension, (-) CAD and (-) Past MI negative cardio ROS  (-) dysrhythmias  Rhythm:Regular Rate:Normal - Systolic murmurs    Neuro/Psych negative neurological ROS  negative psych ROS   GI/Hepatic neg GERD  ,(+)     (-) substance abuse  ,   Endo/Other  neg diabetes  Renal/GU negative Renal ROS     Musculoskeletal   Abdominal (+) + obese,   Peds  Hematology  (+) anemia ,   Anesthesia Other Findings Past Medical History: No date: Anemia No date: Asthma  Reproductive/Obstetrics (+) Pregnancy                             Anesthesia Physical Anesthesia Plan  ASA: III  Anesthesia Plan: Epidural   Post-op Pain Management:    Induction:   PONV Risk Score and Plan: 2 and Treatment may vary due to age or medical condition and Ondansetron  Airway Management Planned: Natural Airway  Additional Equipment:   Intra-op Plan:   Post-operative Plan:   Informed Consent: I have reviewed the patients History and Physical, chart, labs and discussed the procedure including the risks, benefits and alternatives for the proposed anesthesia with the patient or authorized representative who has indicated  his/her understanding and acceptance.       Plan Discussed with: Surgeon  Anesthesia Plan Comments: (Discussed R/B/A of neuraxial anesthesia technique with patient: - rare risks of spinal/epidural hematoma, nerve damage, infection - Risk of PDPH - Risk of itching - Risk of nausea and vomiting - Risk of poor block necessitating replacement of epidural. Patient voiced understanding.)        Anesthesia Quick Evaluation

## 2020-09-05 NOTE — Anesthesia Procedure Notes (Signed)
Epidural Patient location during procedure: OB Start time: 09/05/2020 5:25 PM End time: 09/05/2020 5:50 PM  Staffing Anesthesiologist: Corinda Gubler, MD Performed: anesthesiologist   Preanesthetic Checklist Completed: patient identified, IV checked, site marked, risks and benefits discussed, surgical consent, monitors and equipment checked, pre-op evaluation and timeout performed  Epidural Patient position: sitting Prep: ChloraPrep Patient monitoring: heart rate, continuous pulse ox and blood pressure Approach: midline Location: L3-L4 Injection technique: LOR saline  Needle:  Needle type: Tuohy  Needle gauge: 17 G Needle length: 9 cm and 9 Needle insertion depth: 9 cm Catheter type: closed end flexible Catheter size: 19 Gauge Catheter at skin depth: 14 cm Test dose: negative and 1.5% lidocaine with Epi 1:200 K  Assessment Sensory level: T10 Events: blood not aspirated, injection not painful, no injection resistance, no paresthesia and negative IV test  Additional Notes first attempt - difficult epidural placement Pt. Evaluated and documentation done after procedure finished. Patient identified. Risks/Benefits/Options discussed with patient including but not limited to bleeding, infection, nerve damage, paralysis, failed block, incomplete pain control, headache, blood pressure changes, nausea, vomiting, reactions to medication both or allergic, itching and postpartum back pain. Confirmed with bedside nurse the patient's most recent platelet count. Confirmed with patient that they are not currently taking any anticoagulation, have any bleeding history or any family history of bleeding disorders. Patient expressed understanding and wished to proceed. All questions were answered. Sterile technique was used throughout the entire procedure. Please see nursing notes for vital signs. Test dose was given through epidural catheter and negative prior to continuing to dose epidural or start  infusion. Warning signs of high block given to the patient including shortness of breath, tingling/numbness in hands, complete motor block, or any concerning symptoms with instructions to call for help. Patient was given instructions on fall risk and not to get out of bed. All questions and concerns addressed with instructions to call with any issues or inadequate analgesia.   Patient tolerated the insertion well without immediate complications.Reason for block:procedure for pain

## 2020-09-05 NOTE — Progress Notes (Signed)
Intrapartum Progress Note  S: Patient denies major complaints.   O:  Vitals:   09/05/20 0835 09/05/20 0851 09/05/20 1000 09/05/20 1119  BP: (!) 162/103 (!) 150/109 (!) 148/103 140/88  Pulse: (!) 105 (!) 114 97 91  Resp:      Temp: 97.8 F (36.6 C)   97.7 F (36.5 C)  TempSrc: Oral   Oral  Weight:      Height:        Gen App: NAD, comfortable Abdomen: soft, gravid FHT: baseline 140 bpm.  Accels present.  Decels absent. moderate in degree variability.   Tocometer: contractions irregular, difficult to detect with toco and body habitus but appears every 1-4 minutes Cervix: 2/50-60/ballotable Extremities: Nontender, no edema.  Pitocin: None  Labs:  Results for orders placed or performed during the hospital encounter of 09/05/20  Resp Panel by RT-PCR (Flu A&B, Covid) Nasopharyngeal Swab   Specimen: Nasopharyngeal Swab; Nasopharyngeal(NP) swabs in vial transport medium  Result Value Ref Range   SARS Coronavirus 2 by RT PCR NEGATIVE NEGATIVE   Influenza A by PCR NEGATIVE NEGATIVE   Influenza B by PCR NEGATIVE NEGATIVE  Chlamydia/NGC rt PCR ([facility] only)   Specimen: Urine; GU  Result Value Ref Range   Specimen source GC/Chlam URINE, RANDOM    Chlamydia Tr NOT DETECTED NOT DETECTED   N gonorrhoeae NOT DETECTED NOT DETECTED  Protein / creatinine ratio, urine  Result Value Ref Range   Creatinine, Urine 88 mg/dL   Total Protein, Urine 298 mg/dL   Protein Creatinine Ratio 3.39 (H) 0.00 - 0.15 mg/mg[Cre]  Type and screen [facility]  Result Value Ref Range   ABO/RH(D) A POS    Antibody Screen NEG    Sample Expiration      09/08/2020,2359 Performed at [facility], [address] [place], [place] [address]   ABO/Rh  Result Value Ref Range   ABO/RH(D)      A POS Performed at [facility], [address] [place], [place] [address]      Assessment:  1: SIUP at [redacted]w[redacted]d 2. Mild pre-eclampsia at term 3. H/o asthma  Plan:  1.  Continue IOL with Cytotec. Second dose placed. Foley bulb placed.  2. Mild pre-eclampsia, with some borderline severe range BPs. If remains significantly elevated, will start magnesium sulfate. Nifedipine for severe range BPs if needed.  3. H/o asthma, avoid beta blockers/Hemabate.  Currently asymptomatic.    Hildred Laser, MD 09/05/2020 12:54 PM

## 2020-09-05 NOTE — H&P (Signed)
Obstetric History and Physical  [name] is a 19 y.o. G1P0000 with IUP at [redacted]w[redacted]d presenting for complaints of vaginal bleeding this morning at 0200 this morning. Noted blood in the commode, and then passed a golf-ball sized clot.  While in triage, incidentally noted several elevated blood pressures. PIH labs performed with urine P/C ratio of > 3000.  Patient states she has been having occasional contractions, intact membranes, with active fetal movement (although was decreased prior to coming to triage for evaluation).    Prenatal Course Source of Care: [facility] with onset of care at 9 weeks Pregnancy complications or risks: Patient Active Problem List   Diagnosis Date Noted  . Mild pre-eclampsia in third trimester 09/05/2020  . Pregnancy 08/22/2020  . Asthma affecting pregnancy in third trimester 08/18/2020  . Indication for care in labor or delivery 08/14/2020  . Labor and delivery, indication for care 07/19/2020   She plans to breastfeed She desires unsure method for postpartum contraception.   Prenatal labs and studies: ABO, Rh: A/Positive/-- (05/07 1007) Antibody: Negative (05/07 1007) Rubella: 2.07 (05/07 1007) RPR: Non Reactive (09/21 0918)  HBsAg: Negative (05/07 1007)  HIV: Non Reactive (05/07 1007)  WER:XVQMGQQP/-- (11/10 1133) 1 hr Glucola  normal Genetic screening normal Anatomy US normal    Past Medical History:  Diagnosis Date  . Anemia   . Asthma     Past Surgical History:  Procedure Laterality Date  . FOOT SURGERY  2015  . TOOTH EXTRACTION  2015    OB History  Gravida Para Term Preterm AB Living  1 0 0 0 0 0  SAB TAB Ectopic Multiple Live Births               # Outcome Date GA Lbr Len/2nd Weight Sex Delivery Anes PTL Lv  1 Current             Social History   Socioeconomic History  . Marital status: Single    Spouse name: Not on file  . Number of children: Not on file  . Years of education: Not on file  . Highest education  level: Not on file  Occupational History  . Not on file  Tobacco Use  . Smoking status: Never Smoker  . Smokeless tobacco: Never Used  Vaping Use  . Vaping Use: Never used  Substance and Sexual Activity  . Alcohol use: Never    Alcohol/week: 0.0 standard drinks  . Drug use: Never  . Sexual activity: Not Currently    Comment: undecided  Other Topics Concern  . Not on file  Social History Narrative  . Not on file   Social Determinants of Health   Financial Resource Strain:   . Difficulty of Paying Living Expenses: Not on file  Food Insecurity:   . Worried About Programme researcher, broadcasting/film/video in the Last Year: Not on file  . Ran Out of Food in the Last Year: Not on file  Transportation Needs:   . Lack of Transportation (Medical): Not on file  . Lack of Transportation (Non-Medical): Not on file  Physical Activity:   . Days of Exercise per Week: Not on file  . Minutes of Exercise per Session: Not on file  Stress:   . Feeling of Stress : Not on file  Social Connections:   . Frequency of Communication with Friends and Family: Not on file  . Frequency of Social Gatherings with Friends and Family: Not on file  . Attends Religious Services: Not on  file  . Active Member of Clubs or Organizations: Not on file  . Attends Banker Meetings: Not on file  . Marital Status: Not on file    Family History  Problem Relation Age of Onset  . Rheum arthritis Mother   . Hypertension Mother   . Lupus Mother   . Schizophrenia Father     Medications Prior to Admission  Medication Sig Dispense Refill Last Dose  . ADVAIR DISKUS 100-50 MCG/DOSE AEPB 1 puff 2 (two) times daily.   Past Week at Unknown time  . azithromycin (ZITHROMAX) 250 MG tablet Take as directed: Two pills by mouth the first day, then one pill every day until completed 6 tablet 0 Past Week at Unknown time  . ferrous sulfate 324 MG TBEC Take 324 mg by mouth.   09/04/2020 at Unknown time  . fluticasone (FLONASE) 50 MCG/ACT  nasal spray   0 09/04/2020 at Unknown time  . METADATE CD 20 MG CR capsule Take 20 mg by mouth every morning.   0 Past Week at Unknown time  . PATADAY 0.2 % SOLN   0 Past Month at Unknown time  . Prenat-Fe Poly-Methfol-FA-DHA (VITAFOL ULTRA) 29-0.6-0.4-200 MG CAPS Take 1 tablet by mouth daily. 30 capsule 11 09/04/2020 at Unknown time  . PROAIR HFA 108 (90 BASE) MCG/ACT inhaler inhale 1 to 2 puffs every 4 to 6 hours if needed for cough if needed  0 Past Week at Unknown time  . BENZACLIN gel Apply to face daily (Patient not taking: Reported on 08/22/2020) 50 g 4 Not Taking at Unknown time  . EPIPEN 2-PAK 0.3 MG/0.3ML SOAJ injection use as directed by prescriber if needed (Patient not taking: Reported on 08/22/2020)  0 Not Taking at Unknown time  . promethazine (PHENERGAN) 25 MG tablet Take 1 tablet (25 mg total) by mouth every 6 (six) hours as needed for nausea or vomiting. (Patient not taking: Reported on 09/05/2020) 30 tablet 0 Not Taking at Unknown time    Allergies  Allergen Reactions  . Other Anaphylaxis    Bee stings    Review of Systems: Negative except for what is mentioned in HPI.  Physical Exam: BP (!) 157/97   Pulse (!) 104   Temp 98.2 F (36.8 C) (Oral)   Resp 18   Ht 5\' 7"  (1.702 m)   Wt 120.7 kg   LMP 12/11/2019 (LMP Unknown)   BMI 41.66 kg/m  CONSTITUTIONAL: Well-developed, well-nourished female in no acute distress.  HENT:  Normocephalic, atraumatic, External right and left ear normal. Oropharynx is clear and moist EYES: Conjunctivae and EOM are normal. Pupils are equal, round, and reactive to light. No scleral icterus.  NECK: Normal range of motion, supple, no masses SKIN: Skin is warm and dry. No rash noted. Not diaphoretic. No erythema. No pallor. NEUROLOGIC: Alert and oriented to person, place, and time. Normal reflexes, muscle tone coordination. No cranial nerve deficit noted. PSYCHIATRIC: Normal mood and affect. Normal behavior. Normal judgment and thought  content. CARDIOVASCULAR: Normal heart rate noted, regular rhythm RESPIRATORY: Effort and breath sounds normal, no problems with respiration noted ABDOMEN: Soft, nontender, nondistended, gravid. MUSCULOSKELETAL: Normal range of motion. No edema and no tenderness. 2+ distal pulses.  Cervical Exam: Dilatation 1.5cm   Effacement 30%   Station ballotable   Presentation: cephalic FHT:  Baseline rate 130 bpm   Variability moderate  Accelerations present   Decelerations none Contractions: Occasional contraction   Pertinent Labs/Studies:   Results for orders placed or performed  during the hospital encounter of 09/05/20 (from the past 24 hour(s))  Protein / creatinine ratio, urine     Status: Abnormal   Collection Time: 09/05/20  4:04 AM  Result Value Ref Range   Creatinine, Urine 88 mg/dL   Total Protein, Urine 298 mg/dL   Protein Creatinine Ratio 3.39 (H) 0.00 - 0.15 mg/mg[Cre]  CBC     Status: Abnormal   Collection Time: 09/05/20  4:52 AM  Result Value Ref Range   WBC 6.5 4.0 - 10.5 K/uL   RBC 3.60 (L) 3.87 - 5.11 MIL/uL   Hemoglobin 11.8 (L) 12.0 - 15.0 g/dL   HCT 57.3 (L) 36 - 46 %   MCV 94.2 80.0 - 100.0 fL   MCH 32.8 26.0 - 34.0 pg   MCHC 34.8 30.0 - 36.0 g/dL   RDW 22.0 25.4 - 27.0 %   Platelets 172 150 - 400 K/uL   nRBC 0.0 0.0 - 0.2 %  Comprehensive metabolic panel     Status: Abnormal   Collection Time: 09/05/20  4:52 AM  Result Value Ref Range   Sodium 135 135 - 145 mmol/L   Potassium 4.6 3.5 - 5.1 mmol/L   Chloride 104 98 - 111 mmol/L   CO2 24 22 - 32 mmol/L   Glucose, Bld 82 70 - 99 mg/dL   BUN 11 6 - 20 mg/dL   Creatinine, Ser 6.23 0.44 - 1.00 mg/dL   Calcium 9.2 8.9 - 76.2 mg/dL   Total Protein 6.2 (L) 6.5 - 8.1 g/dL   Albumin 2.6 (L) 3.5 - 5.0 g/dL   AST 16 15 - 41 U/L   ALT 10 0 - 44 U/L   Alkaline Phosphatase 231 (H) 38 - 126 U/L   Total Bilirubin 0.5 0.3 - 1.2 mg/dL   GFR, Estimated >83 >15 mL/min   Anion gap 7 5 - 15    Assessment : Sandy Burns is a  19 y.o. G1P0000 at [redacted]w[redacted]d being admitted forinduction of labor due to mild pre-eclampsia at term, vaginal bleeding resolved. H/o asthma.  Plan: Labor:  Induction as ordered as per protocol. Analgesia as needed. Nifedipine for severe range BPs if present.  Also will initiate Magnesium sulfate if severe range BPs.  FWB: Reassuring fetal heart tracing.  GBS negative Delivery plan: Hopeful for vaginal delivery    Hildred Laser, MD [facility]

## 2020-09-06 ENCOUNTER — Encounter
Admission: EM | Disposition: A | Discharge status: Home / Self Care | Payer: Self-pay | Source: Home / Self Care | Attending: Obstetrics and Gynecology

## 2020-09-06 ENCOUNTER — Inpatient Hospital Stay: Payer: Medicaid Other | Admitting: Anesthesiology

## 2020-09-06 HISTORY — PX: REPAIR VAGINAL CUFF: SHX6067

## 2020-09-06 LAB — CBC
HCT: 26.7 % — ABNORMAL LOW (ref 36.0–46.0)
HCT: 25.5 % — ABNORMAL LOW (ref 36.0–46.0)
Hemoglobin: 9.3 g/dL — ABNORMAL LOW (ref 12.0–15.0)
Hemoglobin: 8.8 g/dL — ABNORMAL LOW (ref 12.0–15.0)
MCH: 32.6 pg (ref 26.0–34.0)
MCH: 31.5 pg (ref 26.0–34.0)
MCHC: 34.8 g/dL (ref 30.0–36.0)
MCHC: 34.5 g/dL (ref 30.0–36.0)
MCV: 93.7 fL (ref 80.0–100.0)
MCV: 91.4 fL (ref 80.0–100.0)
Platelets: 165 10*3/uL (ref 150–400)
Platelets: 141 10*3/uL — ABNORMAL LOW (ref 150–400)
RBC: 2.85 MIL/uL — ABNORMAL LOW (ref 3.87–5.11)
RBC: 2.79 MIL/uL — ABNORMAL LOW (ref 3.87–5.11)
RDW: 12.4 % (ref 11.5–15.5)
RDW: 14.4 % (ref 11.5–15.5)
WBC: 12.6 10*3/uL — ABNORMAL HIGH (ref 4.0–10.5)
WBC: 12.6 10*3/uL — ABNORMAL HIGH (ref 4.0–10.5)
nRBC: 0 % (ref 0.0–0.2)
nRBC: 0 % (ref 0.0–0.2)

## 2020-09-06 LAB — FIBRIN DERIVATIVES D-DIMER (ARMC ONLY): Fibrin derivatives D-dimer (ARMC): 2418.84 ng/mL (FEU) — ABNORMAL HIGH (ref 0.00–499.00)

## 2020-09-06 LAB — PROTIME-INR
INR: 1.1 (ref 0.8–1.2)
Prothrombin Time: 13.9 seconds (ref 11.4–15.2)

## 2020-09-06 LAB — APTT: aPTT: 31 seconds (ref 24–36)

## 2020-09-06 LAB — FIBRINOGEN: Fibrinogen: 328 mg/dL (ref 210–475)

## 2020-09-06 LAB — HEMOGLOBIN AND HEMATOCRIT, BLOOD
HCT: 22.6 % — ABNORMAL LOW (ref 36.0–46.0)
Hemoglobin: 7.6 g/dL — ABNORMAL LOW (ref 12.0–15.0)

## 2020-09-06 LAB — RPR: RPR Ser Ql: NONREACTIVE

## 2020-09-06 LAB — PREPARE RBC (CROSSMATCH)

## 2020-09-06 SURGERY — REPAIR, VAGINAL CUFF
Anesthesia: General

## 2020-09-06 MED ORDER — CALCIUM CHLORIDE 10 % IV SOLN
INTRAVENOUS | Status: AC
  Filled 2020-09-06: qty 10

## 2020-09-06 MED ORDER — ACETAMINOPHEN 500 MG PO TABS
1000.0000 mg | ORAL_TABLET | Freq: Four times a day (QID) | ORAL | Status: DC | PRN
  Administered 2020-09-06: 1000 mg via ORAL
  Filled 2020-09-06: qty 2

## 2020-09-06 MED ORDER — ESMOLOL HCL 100 MG/10ML IV SOLN
INTRAVENOUS | Status: DC | PRN
  Administered 2020-09-06: 20 mg via INTRAVENOUS

## 2020-09-06 MED ORDER — METHYLERGONOVINE MALEATE 0.2 MG/ML IJ SOLN
INTRAMUSCULAR | Status: AC
  Administered 2020-09-06: 0.2 mg via INTRAVENOUS
  Filled 2020-09-06: qty 1

## 2020-09-06 MED ORDER — PHENYLEPHRINE HCL (PRESSORS) 10 MG/ML IV SOLN
INTRAVENOUS | Status: DC | PRN
  Administered 2020-09-06 (×2): 100 ug via INTRAVENOUS

## 2020-09-06 MED ORDER — METHYLPHENIDATE HCL 10 MG PO TABS
10.0000 mg | ORAL_TABLET | Freq: Two times a day (BID) | ORAL | Status: DC
  Filled 2020-09-06 (×2): qty 1

## 2020-09-06 MED ORDER — LIDOCAINE HCL (PF) 2 % IJ SOLN
INTRAMUSCULAR | Status: AC
  Filled 2020-09-06: qty 5

## 2020-09-06 MED ORDER — DIPHENHYDRAMINE HCL 25 MG PO CAPS: 25.0000 mg | ORAL_CAPSULE | Freq: Once | ORAL | Status: DC

## 2020-09-06 MED ORDER — LACTATED RINGERS IV SOLN: INTRAVENOUS | Status: DC | PRN

## 2020-09-06 MED ORDER — PROPOFOL 10 MG/ML IV BOLUS
INTRAVENOUS | Status: DC | PRN
  Administered 2020-09-06: 150 mg via INTRAVENOUS

## 2020-09-06 MED ORDER — DEXAMETHASONE SODIUM PHOSPHATE 10 MG/ML IJ SOLN
INTRAMUSCULAR | Status: DC | PRN
  Administered 2020-09-06: 10 mg via INTRAVENOUS

## 2020-09-06 MED ORDER — ETOMIDATE 2 MG/ML IV SOLN
INTRAVENOUS | Status: AC
  Filled 2020-09-06: qty 10

## 2020-09-06 MED ORDER — HYDRALAZINE HCL 20 MG/ML IJ SOLN
10.0000 mg | Freq: Once | INTRAMUSCULAR | Status: AC
  Administered 2020-09-06: 10 mg via INTRAVENOUS
  Filled 2020-09-06: qty 0.5

## 2020-09-06 MED ORDER — HYDROMORPHONE HCL 1 MG/ML IJ SOLN: 1.0000 mg | INTRAMUSCULAR | Status: DC | PRN

## 2020-09-06 MED ORDER — CALCIUM CHLORIDE 10 % IV SOLN
INTRAVENOUS | Status: DC | PRN
  Administered 2020-09-06: 1 g via INTRAVENOUS

## 2020-09-06 MED ORDER — LIDOCAINE HCL (PF) 1 % IJ SOLN
INTRAMUSCULAR | Status: AC
  Filled 2020-09-06: qty 30

## 2020-09-06 MED ORDER — ACETAMINOPHEN 10 MG/ML IV SOLN: 1000.0000 mg | Freq: Once | INTRAVENOUS | Status: DC | PRN

## 2020-09-06 MED ORDER — ACETAMINOPHEN 500 MG PO TABS: 1000.0000 mg | ORAL_TABLET | Freq: Once | ORAL | Status: DC

## 2020-09-06 MED ORDER — SODIUM CHLORIDE 0.9% IV SOLUTION: Freq: Once | INTRAVENOUS | Status: DC

## 2020-09-06 MED ORDER — LIDOCAINE HCL (CARDIAC) PF 100 MG/5ML IV SOSY
PREFILLED_SYRINGE | INTRAVENOUS | Status: DC | PRN
  Administered 2020-09-06: 100 mg via INTRAVENOUS

## 2020-09-06 MED ORDER — NIFEDIPINE ER OSMOTIC RELEASE 30 MG PO TB24
30.0000 mg | ORAL_TABLET | Freq: Every day | ORAL | Status: DC
  Administered 2020-09-06 – 2020-09-07 (×2): 30 mg via ORAL
  Filled 2020-09-06 (×2): qty 1

## 2020-09-06 MED ORDER — SODIUM CHLORIDE 0.9% IV SOLUTION: Freq: Once | INTRAVENOUS | Status: AC

## 2020-09-06 MED ORDER — OXYTOCIN-SODIUM CHLORIDE 30-0.9 UT/500ML-% IV SOLN: 2.5000 [IU]/h | INTRAVENOUS | Status: DC

## 2020-09-06 MED ORDER — PROPOFOL 10 MG/ML IV BOLUS
INTRAVENOUS | Status: AC
  Filled 2020-09-06: qty 20

## 2020-09-06 MED ORDER — FUROSEMIDE 10 MG/ML IJ SOLN
20.0000 mg | Freq: Once | INTRAMUSCULAR | Status: AC
  Administered 2020-09-06: 20 mg via INTRAVENOUS
  Filled 2020-09-06: qty 2

## 2020-09-06 MED ORDER — METHYLERGONOVINE MALEATE 0.2 MG/ML IJ SOLN: 0.2000 mg | INTRAMUSCULAR | Status: AC

## 2020-09-06 MED ORDER — FENTANYL CITRATE (PF) 100 MCG/2ML IJ SOLN
INTRAMUSCULAR | Status: DC | PRN
  Administered 2020-09-06 (×4): 50 ug via INTRAVENOUS
  Administered 2020-09-06: 100 ug via INTRAVENOUS
  Administered 2020-09-06: 50 ug via INTRAVENOUS

## 2020-09-06 MED ORDER — ESMOLOL HCL 100 MG/10ML IV SOLN
INTRAVENOUS | Status: AC
  Filled 2020-09-06: qty 10

## 2020-09-06 MED ORDER — HYDRALAZINE HCL 20 MG/ML IJ SOLN
INTRAMUSCULAR | Status: AC
  Filled 2020-09-06: qty 1

## 2020-09-06 MED ORDER — DEXAMETHASONE SODIUM PHOSPHATE 10 MG/ML IJ SOLN
INTRAMUSCULAR | Status: AC
  Filled 2020-09-06: qty 1

## 2020-09-06 MED ORDER — SUGAMMADEX SODIUM 200 MG/2ML IV SOLN
INTRAVENOUS | Status: DC | PRN
  Administered 2020-09-06: 200 mg via INTRAVENOUS

## 2020-09-06 MED ORDER — SODIUM CHLORIDE 0.9 % IV SOLN: INTRAVENOUS | Status: DC | PRN

## 2020-09-06 MED ORDER — ONDANSETRON HCL 4 MG/2ML IJ SOLN
INTRAMUSCULAR | Status: AC
  Filled 2020-09-06: qty 2

## 2020-09-06 MED ORDER — OXYCODONE HCL 5 MG/5ML PO SOLN: 5.0000 mg | Freq: Once | ORAL | Status: DC | PRN

## 2020-09-06 MED ORDER — ONDANSETRON HCL 4 MG/2ML IJ SOLN: 4.0000 mg | Freq: Once | INTRAMUSCULAR | Status: DC | PRN

## 2020-09-06 MED ORDER — ARTIFICIAL TEARS OPHTHALMIC OINT
TOPICAL_OINTMENT | OPHTHALMIC | Status: AC
  Filled 2020-09-06: qty 3.5

## 2020-09-06 MED ORDER — ROCURONIUM BROMIDE 10 MG/ML (PF) SYRINGE
PREFILLED_SYRINGE | INTRAVENOUS | Status: AC
  Filled 2020-09-06: qty 10

## 2020-09-06 MED ORDER — OXYCODONE HCL 5 MG PO TABS
5.0000 mg | ORAL_TABLET | ORAL | Status: DC | PRN
  Administered 2020-09-06: 5 mg via ORAL
  Filled 2020-09-06: qty 1

## 2020-09-06 MED ORDER — FENTANYL CITRATE (PF) 100 MCG/2ML IJ SOLN
INTRAMUSCULAR | Status: AC
  Filled 2020-09-06: qty 2

## 2020-09-06 MED ORDER — ROCURONIUM BROMIDE 100 MG/10ML IV SOLN
INTRAVENOUS | Status: DC | PRN
  Administered 2020-09-06: 30 mg via INTRAVENOUS

## 2020-09-06 MED ORDER — OXYCODONE HCL 5 MG PO TABS: 5.0000 mg | ORAL_TABLET | Freq: Once | ORAL | Status: DC | PRN

## 2020-09-06 MED ORDER — IBUPROFEN 600 MG PO TABS
600.0000 mg | ORAL_TABLET | Freq: Four times a day (QID) | ORAL | Status: DC
  Administered 2020-09-06 – 2020-09-07 (×3): 600 mg via ORAL
  Filled 2020-09-06 (×3): qty 1

## 2020-09-06 MED ORDER — SUCCINYLCHOLINE CHLORIDE 200 MG/10ML IV SOSY
PREFILLED_SYRINGE | INTRAVENOUS | Status: AC
  Filled 2020-09-06: qty 10

## 2020-09-06 MED ORDER — ONDANSETRON HCL 4 MG/2ML IJ SOLN
INTRAMUSCULAR | Status: DC | PRN
  Administered 2020-09-06: 4 mg via INTRAVENOUS

## 2020-09-06 MED ORDER — SUCCINYLCHOLINE CHLORIDE 20 MG/ML IJ SOLN
INTRAMUSCULAR | Status: DC | PRN
  Administered 2020-09-06: 140 mg via INTRAVENOUS

## 2020-09-06 MED ORDER — HYDROMORPHONE HCL 1 MG/ML IJ SOLN: 0.5000 mg | INTRAMUSCULAR | Status: DC | PRN

## 2020-09-06 MED ORDER — FENTANYL CITRATE (PF) 250 MCG/5ML IJ SOLN
INTRAMUSCULAR | Status: AC
  Filled 2020-09-06: qty 5

## 2020-09-06 MED ORDER — OXYCODONE HCL 5 MG PO TABS
10.0000 mg | ORAL_TABLET | Freq: Four times a day (QID) | ORAL | Status: DC | PRN
  Administered 2020-09-06: 10 mg via ORAL
  Filled 2020-09-06: qty 2

## 2020-09-06 MED ORDER — ACETAMINOPHEN 10 MG/ML IV SOLN
INTRAVENOUS | Status: AC
  Filled 2020-09-06: qty 100

## 2020-09-06 MED ORDER — ACETAMINOPHEN 10 MG/ML IV SOLN
INTRAVENOUS | Status: DC | PRN
  Administered 2020-09-06: 1000 mg via INTRAVENOUS

## 2020-09-06 MED ORDER — FENTANYL CITRATE (PF) 100 MCG/2ML IJ SOLN: 25.0000 ug | INTRAMUSCULAR | Status: DC | PRN

## 2020-09-06 SURGICAL SUPPLY — 21 items
BLADE SURG SZ11 CARB STEEL (BLADE) ×2 IMPLANT
CATH ROBINSON RED A/P 16FR (CATHETERS) ×2 IMPLANT
COVER WAND RF STERILE (DRAPES) IMPLANT
DRSG TELFA 3X8 NADH (GAUZE/BANDAGES/DRESSINGS) ×2 IMPLANT
GAUZE 4X4 16PLY RFD (DISPOSABLE) ×2 IMPLANT
GAUZE PACK 2X3YD (PACKING) ×4 IMPLANT
GLOVE BIO SURGEON STRL SZ 6.5 (GLOVE) ×2 IMPLANT
GLOVE INDICATOR 7.0 STRL GRN (GLOVE) ×2 IMPLANT
GOWN STRL REUS W/ TWL LRG LVL3 (GOWN DISPOSABLE) ×2 IMPLANT
GOWN STRL REUS W/TWL LRG LVL3 (GOWN DISPOSABLE) ×2
KIT TURNOVER CYSTO (KITS) ×2 IMPLANT
MANIFOLD NEPTUNE II (INSTRUMENTS) ×2 IMPLANT
NEEDLE HYPO 25X1 1.5 SAFETY (NEEDLE) ×2 IMPLANT
NEEDLE SPNL 25GX3.5 QUINCKE BL (NEEDLE) ×2 IMPLANT
NS IRRIG 500ML POUR BTL (IV SOLUTION) ×2 IMPLANT
PACK DNC HYST (MISCELLANEOUS) ×2 IMPLANT
PAD OB MATERNITY 4.3X12.25 (PERSONAL CARE ITEMS) ×2 IMPLANT
PAD PREP 24X41 OB/GYN DISP (PERSONAL CARE ITEMS) ×2 IMPLANT
SUT VIC AB 2-0 CT1 (SUTURE) ×6 IMPLANT
SUT VIC AB 2-0 CT1 36 (SUTURE) ×8 IMPLANT
SYR 10ML LL (SYRINGE) ×2 IMPLANT

## 2020-09-06 NOTE — Anesthesia Postprocedure Evaluation (Signed)
Anesthesia Post Note  Patient: Sandy Burns  Procedure(s) Performed: AN AD HOC LABOR EPIDURAL  Patient location during evaluation: Mother Baby Anesthesia Type: Epidural Level of consciousness: awake and alert Pain management: pain level controlled Vital Signs Assessment: post-procedure vital signs reviewed and stable Respiratory status: spontaneous breathing, nonlabored ventilation and respiratory function stable Cardiovascular status: stable Postop Assessment: no headache, no backache and epidural receding Anesthetic complications: no   No complications documented.   Last Vitals:  Vitals:   09/06/20 0744 09/06/20 0800  BP: 132/69   Pulse: (!) 114 (!) 127  Resp: 18   Temp: 36.9 C   SpO2: 97% 96%    Last Pain:  Vitals:   09/06/20 0744  TempSrc: Oral  PainSc:                  Rica Mast

## 2020-09-06 NOTE — Progress Notes (Signed)
Dr. Valentino Saxon notified of pt h&H results. To transfuse 1 unit as ordered.

## 2020-09-06 NOTE — Progress Notes (Signed)
To OR via Bed. Report given to OR Nurse.

## 2020-09-06 NOTE — Progress Notes (Addendum)
Dr. Valentino Saxon in to assess vaginal bleeding. Vaginal packing is saturated with bright red blood. Lab here to draw ordered labs.

## 2020-09-06 NOTE — Anesthesia Preprocedure Evaluation (Signed)
Anesthesia Evaluation  Patient identified by MRN, date of birth, ID band Patient awake  General Assessment Comment:S/p vaginal delivery with concern for vaginal laceration. Total EBL prior to OR arrival estimated to be 2L.  Reviewed: Allergy & Precautions, NPO status , Patient's Chart, lab work & pertinent test results  History of Anesthesia Complications Negative for: history of anesthetic complications  Airway Mallampati: III  TM Distance: >3 FB Neck ROM: Full   Comment: One upper lip frenulum ring non removable Dental no notable dental hx. (+) Teeth Intact   Pulmonary asthma , neg sleep apnea, neg COPD, Patient abstained from smoking.Not current smoker,    Pulmonary exam normal breath sounds clear to auscultation       Cardiovascular Exercise Tolerance: Good METShypertension, (-) CAD and (-) Past MI (-) dysrhythmias  Rhythm:Regular Rate:Normal - Systolic murmurs    Neuro/Psych negative neurological ROS  negative psych ROS   GI/Hepatic neg GERD  ,(+)     (-) substance abuse  ,   Endo/Other  neg diabetes  Renal/GU negative Renal ROS     Musculoskeletal   Abdominal (+) + obese,   Peds  Hematology  (+) anemia ,   Anesthesia Other Findings Past Medical History: No date: Anemia No date: Asthma  Reproductive/Obstetrics (+) Pregnancy Pre eclampsia                             Anesthesia Physical  Anesthesia Plan  ASA: III and emergent  Anesthesia Plan: General   Post-op Pain Management:    Induction: Intravenous and Rapid sequence  PONV Risk Score and Plan: 4 or greater and Ondansetron, Dexamethasone and Treatment may vary due to age or medical condition  Airway Management Planned: Oral ETT and Video Laryngoscope Planned  Additional Equipment: None  Intra-op Plan:   Post-operative Plan: Extubation in OR  Informed Consent: I have reviewed the patients History and Physical,  chart, labs and discussed the procedure including the risks, benefits and alternatives for the proposed anesthesia with the patient or authorized representative who has indicated his/her understanding and acceptance.     Dental advisory given  Plan Discussed with: CRNA and Surgeon  Anesthesia Plan Comments: (Pt had sandwich 4 hours prior.  Discussed risks of anesthesia with patient, including PONV, sore throat, lip/dental damage. Rare risks discussed as well, such as cardiorespiratory and neurological sequelae. Discussed likely need for blood transfusion. Patient counseled on being higher risk for anesthesia due to comorbidities: active hemorrhage, morbid obesity. Patient was told about increased risk of cardiac and respiratory events, including death. Patient understands. )        Anesthesia Quick Evaluation

## 2020-09-06 NOTE — Progress Notes (Addendum)
Called to assess patient by nurse who reports that patient is having active bleeding, concerned for hemorrhage after ambulating to the restroom.  Fundal massage performed by nurse, noting fundal firmness. IVF bolus infusing, patient given 1 dose of Methergine 0.2 mg IV.Speculum exam performed with numerous large clots and moderate amount of blood filling the speculum. Several laparotomy sponges were used to clear blood, small pumping vessel noted on left (vaginal vs cervical laceration).  Attempted to clamp vessel without success due to rapid filling of the speculum with blood. Decision was made to pack the vagina with laparotomy sponge. EBL ~ 1600 during this episode (total EBL since delivery 2005 ml).   Patient still alert and oriented.  Vitals stable at this time.  Will order stat CBC, has been consented for blood products. Plan to proceed to OR for exam under anesthesia and repair of vaginal/cervical laceration. Risks of procedure explained, patient consented for procedure.      Vitals:   09/06/20 0025 09/06/20 0043 09/06/20 0050 09/06/20 0120  BP: 125/65 138/82 (!) 141/86 (!) 147/86  Pulse: (!) 129 (!) 125 (!) 112 (!) 109  Resp: 20 20  20   Temp:      TempSrc:      SpO2: 99% 98% 99%   Weight:      Height:         , MD [facility]

## 2020-09-06 NOTE — Anesthesia Postprocedure Evaluation (Signed)
Anesthesia Post Note  Patient: Sandy Burns  Procedure(s) Performed: REPAIR VAGINAL CUFF (N/A )  Patient location during evaluation: Mother Baby Anesthesia Type: General Level of consciousness: awake and alert Pain management: pain level controlled Vital Signs Assessment: post-procedure vital signs reviewed and stable Respiratory status: spontaneous breathing, nonlabored ventilation, respiratory function stable and patient connected to nasal cannula oxygen Cardiovascular status: blood pressure returned to baseline and stable Postop Assessment: no apparent nausea or vomiting Anesthetic complications: no   No complications documented.   Last Vitals:  Vitals:   09/06/20 0744 09/06/20 0800  BP: 132/69   Pulse: (!) 114 (!) 127  Resp: 18   Temp: 36.9 C   SpO2: 97% 96%    Last Pain:  Vitals:   09/06/20 0744  TempSrc: Oral  PainSc:                  Rica Mast

## 2020-09-06 NOTE — Transfer of Care (Signed)
Immediate Anesthesia Transfer of Care Note  Patient: Sandy Burns  Procedure(s) Performed: REPAIR VAGINAL CUFF (N/A )  Patient Location: PACU  Anesthesia Type:General  Level of Consciousness: drowsy  Airway & Oxygen Therapy: Patient Spontanous Breathing and Patient connected to nasal cannula oxygen  Post-op Assessment: Report given to RN and Post -op Vital signs reviewed and stable  Post vital signs: Reviewed and stable  Last Vitals:  Vitals Value Taken Time  BP 143/102 09/06/20 0418  Temp    Pulse 118 09/06/20 0424  Resp 16 09/06/20 0424  SpO2 100 % 09/06/20 0424  Vitals shown include unvalidated device data.  Last Pain:  Vitals:   09/06/20 0131  TempSrc:   PainSc: 8       Patients Stated Pain Goal: 0 (09/05/20 2015)  Complications: No complications documented.

## 2020-09-06 NOTE — Anesthesia Procedure Notes (Addendum)
Procedure Name: Intubation Date/Time: 09/06/2020 2:36 AM Performed by: Jeronimo Norma, CRNA Pre-anesthesia Checklist: Patient identified, Patient being monitored, Timeout performed, Emergency Drugs available and Suction available Patient Re-evaluated:Patient Re-evaluated prior to induction Oxygen Delivery Method: Circle system utilized Preoxygenation: Pre-oxygenation with 100% oxygen Induction Type: IV induction and Rapid sequence Laryngoscope Size: McGraph and 3 Grade View: Grade I Tube type: Oral Tube size: 6.5 mm Number of attempts: 1 Airway Equipment and Method: Stylet and Video-laryngoscopy Placement Confirmation: ETT inserted through vocal cords under direct vision,  positive ETCO2 and breath sounds checked- equal and bilateral Secured at: 22 cm Tube secured with: Tape Dental Injury: Teeth and Oropharynx as per pre-operative assessment

## 2020-09-06 NOTE — OR Nursing (Signed)
At 0247 in the operating room we removed packing from vagina that was placed while the patient was in Mother Baby

## 2020-09-06 NOTE — Lactation Note (Addendum)
This note was copied from a baby's chart. Lactation Consultation Note  Patient Name: Sandy Burns [id number] Date: 09/06/2020 Reason for consult: Follow-up assessment;Mother's request;Primapara;Early term 37-38.6wks;Other (Comment) (Mostly formula so far - Has slight posterior tongue tie)  Mom had chose breast and formula on admission.  She attempted to breast feed twice since delivery.  Mom still voices that she wants to breast feed, but hemorrhaged through the night requiring I&D repair in the OR.  She also did not think he would be able to latch to her nipple because she thought it was too big and too flat.  Demonstrated how to compress right breast and nipple everted well.  Left areola was a little more firm and less compressible.  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  Discussed with Dr. Earnest Conroy.  Does not seem to be interfering with feeding.  Explained to mom and encouraged to mention to Pediatrician on tomorrow.  Sandy Burns was sleepy after circumcision.  He finally started waking up some about 1400 pm.  When gauze was removed from circumcision, it woke him up enough to get him to go to the right breast for about 5 minutes of good rhythmic sucking before he started spitting up[ the formula from the previous feeding.  For the next feeding over an hour later, we had more difficulty latching.  Mom requested to pump.  Set up Symphony DEBP.  Instructions given on breast massage, hand expression, pumping, collection, storage, cleaning, labeling and handling of expressed milk.  She expressed 3 ml from right breast and 1 ml from left breast which was put in bottle and given with added formula.  Mom preferred the #24 flanges.  Mom's right nipple where he nursed longer was a little tender.  Hand expressed some colostrum on nipples to prevent bacteria, lubricate and for healing and discomfort.  Coconut oil given with instructions in use on nipples and around flange of pump.  Hand out given on what to expect with  feedings the first 4 days of life reviewing normal newborn stomach size, adequate intake and out put, supply and demand, normal course of lactation and routine newborn feeding patterns.  Government social research officer and LLL sheet given with contact numbers, web sites and support groups and reviewed.  Lactation name and number written on white board and encouraged to call with any questions, concerns or assistance. Maternal Data Formula Feeding for Exclusion: No Has patient been taught Hand Expression?: Yes Does the patient have breastfeeding experience prior to this delivery?: No (Gr1)  Feeding Feeding Type: Breast Fed (Left breast in football hold) Nipple Type: Slow - flow  LATCH Score Latch: Repeated attempts needed to sustain latch, nipple held in mouth throughout feeding, stimulation needed to elicit sucking reflex.  Audible Swallowing: A few with stimulation  Type of Nipple: Everted at rest and after stimulation (Right nipple is more compressible & everted than left)  Comfort (Breast/Nipple): Soft / non-tender  Hold (Positioning): Assistance needed to correctly position infant at breast and maintain latch.  LATCH Score: 7  Interventions Interventions: Breast feeding basics reviewed;Assisted with latch;Skin to skin;Breast massage;Hand express;Reverse pressure;Breast compression;Adjust position;Support pillows;Position options;Coconut oil;DEBP  Lactation Tools Discussed/Used Tools: Pump;Coconut oil Breast pump type: Double-Electric Breast Pump WIC Program: Yes Pump Review: Setup, frequency, and cleaning;Milk Storage;Other (comment) Initiated by:: S.Meggen Spaziani,RNC,BSN,IBCLC Date initiated:: 09/06/20   Consult Status Consult Status: Follow-up Follow-up type: Call as needed    Sandy Burns 09/06/2020, 5:41 PM

## 2020-09-06 NOTE — Progress Notes (Signed)
Pt. Back to bed after attempting to void. Pt. Could not void. Fundus is Firm and mid-line;however, there is a steady stream of Bright red blood coming from vagina. Fundus massaged, Bladder scanned. 320cc of Urine in Bladder per Scanner. Dr. Valentino Saxon called and hemorrhage  Reported. Dr. Valentino Saxon is on her way. Helane Gunther RN here to assist. IV started and NS infusing. Foley Catheter placed and draining clear amber urine. IM Methergine given at 0043. 1930 QBL. Dr. Valentino Saxon here to see Pt. At 0055 and is doing an Internal exam and has placed vaginal packing.  Dr. Valentino Saxon states she will take pt. To OR for Repair. Pt. Is alert and oriented with appropriate affect. Color good, skin w&d. See VS Flow Sheet for VS.

## 2020-09-06 NOTE — Plan of Care (Signed)
Transferred to Room 343 Post Partum. Alert and oriented with pleasant affect. Color good, skin w&d. Fundus is firm at U/1 with small amount of Lochia. Assessment and VS WNL. Denies c/o. Oriented to Room, Safety and Security and POC. Pt. V/O.

## 2020-09-06 NOTE — Progress Notes (Signed)
Post Partum Day # 1, s/p SVD. Pre-eclampsia without severe features. Postpartum course complicated by postpartum hemorrhage, vaginal sidewall heamtoma requiring I&D and repair in OR. S/p 2 units PRBCs and 1 unit FFP  Subjective: no complaints. Notes pain is controlled. Not yet ambulating due to foley catheter in place. Has tolerated food.   Objective: Temp:  [97.4 F (36.3 C)-98.6 F (37 C)] 98.5 F (36.9 C) (11/28 0744) Pulse Rate:  [91-143] 127 (11/28 0900) Resp:  [0-20] 18 (11/28 0744) BP: (103-163)/(63-122) 132/69 (11/28 0744) SpO2:  [96 %-100 %] 96 % (11/28 0900)  I/O last 3 completed shifts: In: 3045 [I.V.:2000; Blood:1045] Out: 4105 [Urine:400; Blood:3705] No intake/output data recorded.   Physical Exam:  General: alert and no distress  Lungs: clear to auscultation bilaterally Breasts: normal appearance, no masses or tenderness Heart: S1, S2 normal and tachycardia present.  Abdomen: soft, non-tender; bowel sounds normal; no masses,  no organomegaly Pelvis: Lochia: vaginal packing (laparotomy sponge in place). Uterine Fundus: firm Extremities: DVT Evaluation: No evidence of DVT seen on physical exam. Negative Homan's sign. No cords or calf tenderness. No significant calf/ankle edema.  SCDs in place.   CBC Latest Ref Rng & Units 09/06/2020 09/06/2020 09/05/2020  WBC 4.0 - 10.5 K/uL 12.6(H) 12.6(H) 6.5  Hemoglobin 12.0 - 15.0 g/dL 3.7(S) 2.8(B) 11.8(L)  Hematocrit 36 - 46 % 25.5(L) 26.7(L) 33.9(L)  Platelets 150 - 400 K/uL 141(L) 165 172     Results for ZORIANNA, TALIAFERRO (MRN [id number]) as of 09/06/2020 09:42  Ref. Range 09/06/2020 04:00  Fibrinogen Latest Ref Range: 210 - 475 mg/dL 371  Fibrin derivatives D-dimer ([facility]) Latest Ref Range: 0.00 - 499.00 ng/mL (FEU) 2,418.84 (H)  Prothrombin Time Latest Ref Range: 11.4 - 15.2 seconds 13.9  INR Latest Ref Range: 0.8 - 1.2  1.1  APTT Latest Ref Range: 24 - 36 seconds 31   Assessment/Plan: 1. Routine postpartum care  -  Breastfeeding, Lactation consult. Also formula feeding.  - Circumcision prior to discharge  - Contraception undecided.   2. Postpartum hemorrhage with I&D of vaginal wall hematoma - Vaginal packing in place.  - Maintain foley catheter until vaginal packing removed. Will remove packing after 12 hrs post-insertion (~ 4 pm today) - Moderate anemia noted. S/p blood transfusion with 2 units PRBCs and 1 unit FFP. Patient tachycardic but otherwise asymptomatic. Will await until patient is ambulatory. If she becomes symptomatic, will consider transfusion of 1 more additional unit of PRBCs.  - Continue PO pain management as needed.   3. Pre-eclampsia without severe features  - BP's elevated post-surgery.  Given 1 dose of IV hydralazine in the PACU.  Will continue to monitor.  Can start Nifidepine for BPs postpartum.   Dispo: Will continue to monitor. Possibly d/c home tomorrow if she remains stable.     LOS: 1 day   Hildred Laser, MD [facility] 09/06/2020 9:36 AM

## 2020-09-06 NOTE — Op Note (Addendum)
Procedure(s): EXAM UNDER ANESTHESIA, VAGINAL LACERATION REPAIR Procedure Note  Sandy Burns female 19 y.o. 09/06/2020  Indications: The patient is a 19 y.o. G55P1001 female postpartum day #1 s/p SVD with delayed postpartum hemorrhage.  Concern for deep vaginal or cervical laceration.    Pre-operative Diagnosis: Delayed postpartum hemorrhage s/p uncomplicated SVD, vaginal/cervical laceration  Post-operative Diagnosis: Delayed postpartum hemorrhage s/p uncomplicated SVD, vaginallaceration, s/p blood transfusion 2 units PRBCs and 1 unit FFP intraoperatively   Surgeon: Hildred Laser, MD  Assistants: Surgical scrub tech  Anesthesia: General endotracheal anesthesia  Procedure Details: The patient was seen in the Holding Room. The risks, benefits, complications, treatment options, and expected outcomes were discussed with the patient.  The patient concurred with the proposed plan, giving informed consent.  The site of surgery properly noted/marked. The patient was taken to the Operating Room, identified as Sandy Burns and the procedure verified as Procedure(s) (LRB): EXAM UNDER ANESTHESIA, REPAIR VAGINAL CUFF (N/A). A Time Out was held and the above information confirmed.  She was then placed under general anesthesia without difficulty. She was placed in the dorsal lithotomy position in candy cane stirrups, and was prepped and draped in a sterile manner.  A foley catheter was previously in place.  A sterile speculum was inserted into the vagina and the vaginal vault was cleared of all blood and clots.  On the right vaginal sidewall there was noted to be a large hematoma present.  The hematoma was incised using a scalpel and was evacuated.  After evacuation was completed, the vaginal sidewall was repaired using a 2-0 Vicryl suture in a running fashion.  There was still noted to be moderate bleeding noted, coming from another area of the right vaginal sidewall below the previous incision repaired.   This area was then also repaired using a 2-0 Vicryl in a figure-of-eight fashion. Next, the cervix was identified and grasped at the 12 o'clock position.  Serial ring forcep clamps were placed circumferentially around the cervix to identify any cervical lacerations.  No lacerations were noted.  There was still bleeding noted from the vaginal vault, with no identifiable source, the vagina was packed using sponges and each quadrant of the vagina was visualized.  The left vaginal and posterior sidewall were each noted to have small abrasions however brisk bleeding was noted (likely secondary to trauma from the retractors and speculum. These areas were sutured with 2-0 Vicryl in a figure-of-eight fashion, however with each suture, more bleeding was encountered.  The decision was made to place a vaginal packing as further needle sticks were likely to continue to cause more bleeding.  The packing was placed, however within 1-2 minutes the vaginal packing was soaked through with a steady stream of blood trickling from the vagina, and so it was removed.  The speculum and retractors were then reintroduced into the vagina to visualize any new areas of bleeding.  On the posterior vagina there was a small rent, with brisk bleeding, and a 3-0 Vicryl was used in a figure-of-eight fashion to control bleeding.There was also still bleeding encountered from deeper in the vaginal canal, so the ring forceps were again introduced and used to grasp the cervix for a second survey. The cervical tissue at this time was noted to be more friable, however still no obvious lacerations were present.   The decision was made to attempt use of the Bakri ballon to tamponade the vagina. While the balloon was being prepared, a laparotomy sponge was placed into the vagina, which was  then noted to help control the bleeding. As the bleeding was now noted to be tamponaded with the laparotomy sponge, I did not proceed with use of the Bakri as to not  disturb the tissue bed and create further bleeding.   Due to the heavy nature of her bleeding, coagulation panel was collected while in the OR, will f/u with results.  She received 2 units PRBCs and 1 unit of FFP during her procedure.   The patient was awakened from anesthesia without difficulty.  She tolerated the procedure well, and was taken to the recovery room in stable condition. Instrument and sponge counts were correct x 2.   Findings: Vaginal hematoma noted on right side wall Cervix with no visible lacerations noted.  Friable vaginal and cervical tissue  Estimated Blood Loss:  1045 ml      Drains: foley catheter inserted prior to procedure, with  250 ml of clear urine   Specimens: None         Implants: None. 1 laparotomy sponge retained purposefully in the vagina for tamponade purposes         Complications:  None; patient tolerated the procedure well.         Disposition: PACU - hemodynamically stable.         Condition: stable   Hildred Laser, MD [facility]

## 2020-09-06 NOTE — Progress Notes (Signed)
Vaginal packing removed, moderately soaked with old blood (dark red/brown).  Monitored for several minutes, no active bleeding noted.  Will continue to monitor. If no further bleeding after 30 minutes, can remove foley catheter and allow patient to ambulate.    Hildred Laser, MD [facility]

## 2020-09-07 ENCOUNTER — Other Ambulatory Visit: Payer: Self-pay | Admitting: Obstetrics and Gynecology

## 2020-09-07 ENCOUNTER — Encounter: Payer: Self-pay | Admitting: Obstetrics and Gynecology

## 2020-09-07 DIAGNOSIS — N898 Other specified noninflammatory disorders of vagina: Secondary | ICD-10-CM

## 2020-09-07 DIAGNOSIS — O99210 Obesity complicating pregnancy, unspecified trimester: Secondary | ICD-10-CM | POA: Diagnosis present

## 2020-09-07 DIAGNOSIS — D649 Anemia, unspecified: Secondary | ICD-10-CM | POA: Diagnosis not present

## 2020-09-07 DIAGNOSIS — O99891 Other specified diseases and conditions complicating pregnancy: Secondary | ICD-10-CM

## 2020-09-07 HISTORY — DX: Other specified noninflammatory disorders of vagina: N89.8

## 2020-09-07 LAB — TYPE AND SCREEN
ABO/RH(D): A POS
Antibody Screen: NEGATIVE
Unit division: 0
Unit division: 0
Unit division: 0

## 2020-09-07 LAB — BPAM RBC
Blood Product Expiration Date: 202112282359
Blood Product Expiration Date: 202112282359
Blood Product Expiration Date: 202112282359
ISSUE DATE / TIME: 202111280237
ISSUE DATE / TIME: 202111280237
ISSUE DATE / TIME: 202111281823
Unit Type and Rh: 6200
Unit Type and Rh: 6200
Unit Type and Rh: 6200

## 2020-09-07 LAB — PREPARE FRESH FROZEN PLASMA: Unit division: 0

## 2020-09-07 LAB — BPAM FFP
Blood Product Expiration Date: 202112032359
ISSUE DATE / TIME: 202111280237
Unit Type and Rh: 6200

## 2020-09-07 LAB — HEMOGLOBIN AND HEMATOCRIT, BLOOD
HCT: 22.3 % — ABNORMAL LOW (ref 36.0–46.0)
Hemoglobin: 7.6 g/dL — ABNORMAL LOW (ref 12.0–15.0)

## 2020-09-07 MED ORDER — DOCUSATE SODIUM 100 MG PO CAPS: 100.0000 mg | ORAL_CAPSULE | Freq: Two times a day (BID) | ORAL | 2 refills | Status: DC | PRN

## 2020-09-07 MED ORDER — FERROUS SULFATE 324 MG PO TBEC
324.0000 mg | DELAYED_RELEASE_TABLET | Freq: Three times a day (TID) | ORAL | 1 refills | Status: DC
Start: 2020-09-07 — End: 2021-01-08

## 2020-09-07 MED ORDER — NIFEDIPINE ER 30 MG PO TB24
30.0000 mg | ORAL_TABLET | Freq: Every day | ORAL | 0 refills | Status: DC
Start: 2020-09-07 — End: 2021-01-08

## 2020-09-07 MED ORDER — IBUPROFEN 600 MG PO TABS
600.0000 mg | ORAL_TABLET | Freq: Four times a day (QID) | ORAL | 0 refills | Status: DC
Start: 2020-09-07 — End: 2020-10-28

## 2020-09-07 NOTE — Progress Notes (Addendum)
Brief Pharmacy Note  Pharmacy has been consulted for our anti-hypertensive meds to beds program.   Patient has agreed to the program, and is aware she will be responsible for her regular outpatient prescription copay.   The patient's preferred pharmacy in Epic has been changed to [facility] employee pharmacy (so the electronic prescriptions can be sent there instead of their prior designated pharmacy.)    The electronic rx must be sent to employee pharmacy by 4:30pm the day of discharge for the meds to beds delivery to be possible.  Thank you for including pharmacy in this patient's care.  Addendum:  Medication delivered and patient counseled 09/07/20 about 12:10 pm  Ardath Lepak A, Clinical Pharmacist 09/07/2020 10:43 AM

## 2020-09-07 NOTE — Progress Notes (Signed)
Post Partum Day # 2, s/p SVD. Pre-eclampsia without severe features. Postpartum course complicated by postpartum hemorrhage, vaginal sidewall heamtoma requiring I&D and repair in OR. S/p 3 units PRBCs and 1 unit FFP  Subjective: no complaints, up ad lib, voiding and tolerating PO. Notes pain in her right leg has resolved, and not feeling any more rectal pressure. Denies dizziness or SOB.  Objective: Vitals:   09/07/20 0400 09/07/20 0500 09/07/20 0821 09/07/20 0900  BP: 121/77  124/72   Pulse: (!) 126 (!) 125 98 (!) 139  Resp: 18  18   Temp: 98 F (36.7 C)  98 F (36.7 C)   TempSrc: Oral  Oral   SpO2: 97% 98%  99%  Weight:      Height:          Physical Exam:  General: alert and no distress  Lungs: clear to auscultation bilaterally Breasts: normal appearance, no masses or tenderness Heart: S1, S2 normal and tachycardia present.  Abdomen: soft, non-tender; bowel sounds normal; no masses,  no organomegaly Pelvis: Lochia: vaginal packing (laparotomy sponge in place). Uterine Fundus: firm Extremities: DVT Evaluation: No evidence of DVT seen on physical exam. Negative Homan's sign. No cords or calf tenderness. No significant calf/ankle edema.  SCDs in place.   CBC Latest Ref Rng & Units 09/07/2020 09/06/2020 09/06/2020  WBC 4.0 - 10.5 K/uL - - 12.6(H)  Hemoglobin 12.0 - 15.0 g/dL 7.6(L) 7.6(L) 8.8(L)  Hematocrit 36 - 46 % 22.3(L) 22.6(L) 25.5(L)  Platelets 150 - 400 K/uL - - 141(L)      Assessment/Plan: 1. Routine postpartum care  - Breastfeeding, Lactation consult. Also formula feeding.  - Circumcision done  - Contraception: OCP.   2. Postpartum hemorrhage with I&D of vaginal wall hematoma - no further bleeding noted.  -  Moderate anemia noted. S/p blood transfusion with 3 units PRBCs and 1 unit FFP this admission.  - Patient tachycardic but otherwise asymptomatic. For PO iron treatment.  - Continue PO pain management as needed.   3. Pre-eclampsia without severe  features  - Started on Procardia. Will continue postpartum.   Dispo: Can d/c home today.     LOS: 2 days   Hildred Laser, MD [facility] 09/07/2020 9:49 AM

## 2020-09-07 NOTE — Lactation Note (Signed)
This note was copied from a baby's chart. Lactation Consultation Note  Patient Name: Sandy Burns [id number] Date: 09/07/2020 Reason for consult: Follow-up assessment;Mother's request;Primapara;Early term 37-38.6wks;Other (Comment) (Mom pumped and bottlefed expressed milk)  Mom no longer wants to try putting Sandy Burns to the breast for now.  Mom wants to continue to pump and give her expressed milk.  Mom feels fuller and can easily hand express transitional breast milk.  Assisted mom with pumping using the Symphony pump.  Mom expressed 20 ml which she gave to Sandy Burns via bottle using slow flow nipple.  Mom has a Motiff pump at home which she plans to continue using to supply milk for Sandy Burns.  Discussed supply and demand and differences, prevention and treatment of full breasts, engorgement, plugged ducts and mastitis and when to seek further help.  Stressed importance of frequent draining of the breast either by breast feeding or pumping to bring in mature milk, ensure a plentiful milk supply and prevent engorgement.  Encouraged mom to call with any questions, concerns or assistance. Maternal Data Formula Feeding for Exclusion: No Has patient been taught Hand Expression?: Yes Does the patient have breastfeeding experience prior to this delivery?: No (Gr1)  Feeding Feeding Type: Bottle Fed - Breast Milk Nipple Type: Slow - flow  LATCH Score                   Interventions Interventions: Breast feeding basics reviewed;Hand express;Pre-pump if needed;Coconut oil;Hand pump  Lactation Tools Discussed/Used Tools: Pump;Coconut oil;Bottle Breast pump type: Manual WIC Program: Yes Pump Review: Setup, frequency, and cleaning;Milk Storage;Other (comment) Initiated by:: Sandy Burns,RNC,BSN,IBCLC Date initiated:: 09/06/20   Consult Status Consult Status: PRN Follow-up type: Call as needed    Sandy Burns 09/07/2020, 3:45 PM

## 2020-09-07 NOTE — Discharge Summary (Signed)
Postpartum Discharge Summary      Patient Name: Sandy Burns DOB: January 19, 2001 MRN: [id number]  Date of admission: 09/05/2020 Delivery date:09/05/2020  Delivering provider: Rubie Maid  Date of discharge: 09/07/2020  Admitting diagnosis: Labor and delivery, indication for care [O75.9] Mild pre-eclampsia in third trimester [O14.03] Intrauterine pregnancy: [redacted]w[redacted]d    Secondary diagnosis:  Active Problems:   Labor and delivery, indication for care   Mild pre-eclampsia in third trimester  Additional problems: History of asthma, morbid obesity (BMI 41)    Discharge diagnosis: Term Pregnancy Delivered, Preeclampsia (mild), Anemia and Obesity                                              Post partum procedures:blood transfusion Augmentation: AROM, Cytotec and IP Foley Complications: [id number] Vaginal vault hematoma, requiring I&D  Hospital course: Induction of Labor With Vaginal Delivery   19y.o. yo G1P1001 at 320w3das admitted to the hospital 09/05/2020 for induction of labor.  Indication for induction: Preeclampsia (without severe features).  Patient had an uncomplicated labor course as follows: Membrane Rupture Time/Date: 5:07 PM ,09/05/2020   Delivery Method:Vaginal, Spontaneous  Episiotomy: None  Lacerations:  Vaginal  Details of delivery can be found in separate delivery note.  Patient had a postpartum course complicated by delayed postpartum hemorrhage, had to proceed to OR for better visualization and evacuation of large vaginal hematoma.  She received a total of 3 units PRBCs and 1 unit FFP (2 units of PRBCs and 1 unit FFP in the OR, 3rd unit of PRBCs on PPD#1). By PPD#2 patient was deemed stable for discharge. Patient is discharged home 09/07/20.   Newborn Data: Birth date:09/05/2020  Birth time:6:10 PM  Gender:Female  Living status:Living  Apgars:9 ,9  Weight:2970 g   Magnesium Sulfate received: No BMZ received: No Rhophylac:No MMR:No T-DaP:Given  prenatally Flu: given prenatally Transfusion:Yes  Physical exam  Vitals:   09/07/20 0400 09/07/20 0500 09/07/20 0821 09/07/20 0900  BP: 121/77  124/72   Pulse: (!) 126 (!) 125 98 (!) 139  Resp: 18  18   Temp: 98 F (36.7 C)  98 F (36.7 C)   TempSrc: Oral  Oral   SpO2: 97% 98%  99%  Weight:      Height:       General: alert, cooperative and no distress Lochia: appropriate Uterine Fundus: firm Incision: N/A DVT Evaluation: No evidence of DVT seen on physical exam. Negative Homan's sign. No cords or calf tenderness. No significant calf/ankle edema.    Labs: Lab Results  Component Value Date   WBC 12.6 (H) 09/06/2020   HGB 7.6 (L) 09/07/2020   HCT 22.3 (L) 09/07/2020   MCV 91.4 09/06/2020   PLT 141 (L) 09/06/2020    CMP Latest Ref Rng & Units 09/05/2020  Glucose 70 - 99 mg/dL 82  BUN 6 - 20 mg/dL 11  Creatinine 0.44 - 1.00 mg/dL 0.71  Sodium 135 - 145 mmol/L 135  Potassium 3.5 - 5.1 mmol/L 4.6  Chloride 98 - 111 mmol/L 104  CO2 22 - 32 mmol/L 24  Calcium 8.9 - 10.3 mg/dL 9.2  Total Protein 6.5 - 8.1 g/dL 6.2(L)  Total Bilirubin 0.3 - 1.2 mg/dL 0.5  Alkaline Phos 38 - 126 U/L 231(H)  AST 15 - 41 U/L 16  ALT 0 - 44 U/L 10   Edinburgh Score:  Edinburgh Postnatal Depression Scale Screening Tool 09/06/2020  I have been able to laugh and see the funny side of things. 0  I have looked forward with enjoyment to things. 0  I have blamed myself unnecessarily when things went wrong. 2  I have been anxious or worried for no good reason. 3  I have felt scared or panicky for no good reason. 2  Things have been getting on top of me. 0  I have been so unhappy that I have had difficulty sleeping. 0  I have felt sad or miserable. 0  I have been so unhappy that I have been crying. 0  The thought of harming myself has occurred to me. 0  Edinburgh Postnatal Depression Scale Total 7      After visit meds:  Allergies as of 09/07/2020      Reactions   Other Anaphylaxis    Bee stings      Medication List    STOP taking these medications   azithromycin 250 MG tablet Commonly known as: ZITHROMAX   promethazine 25 MG tablet Commonly known as: PHENERGAN     TAKE these medications   Advair Diskus 100-50 MCG/DOSE Aepb Generic drug: Fluticasone-Salmeterol 1 puff 2 (two) times daily.   BenzaClin gel Generic drug: clindamycin-benzoyl peroxide Apply to face daily   docusate sodium 100 MG capsule Commonly known as: COLACE Take 1 capsule (100 mg total) by mouth 2 (two) times daily as needed for mild constipation.   EpiPen 2-Pak 0.3 mg/0.3 mL Soaj injection Generic drug: EPINEPHrine use as directed by prescriber if needed   ferrous sulfate 324 MG Tbec Take 1 tablet (324 mg total) by mouth in the morning, at noon, and at bedtime. What changed: when to take this   fluticasone 50 MCG/ACT nasal spray Commonly known as: FLONASE   ibuprofen 600 MG tablet Commonly known as: ADVIL Take 1 tablet (600 mg total) by mouth every 6 (six) hours.   Metadate CD 20 MG CR capsule Generic drug: methylphenidate Take 20 mg by mouth every morning.   NIFEdipine 30 MG 24 hr tablet Commonly known as: ADALAT CC Take 1 tablet (30 mg total) by mouth daily.   Pataday 0.2 % Soln Generic drug: Olopatadine HCl   ProAir HFA 108 (90 Base) MCG/ACT inhaler Generic drug: albuterol inhale 1 to 2 puffs every 4 to 6 hours if needed for cough if needed   Vitafol Ultra 29-0.6-0.4-200 MG Caps Take 1 tablet by mouth daily.        Discharge home in stable condition Infant Feeding: Bottle and Breast Infant Disposition:home with mother Discharge instruction: per After Visit Summary and Postpartum booklet. Activity: Advance as tolerated. Pelvic rest for 6 weeks.  Diet: routine diet Anticipated Birth Control: OCPs Postpartum Appointment:6 weeks Additional Postpartum F/U: BP check 2-3 days Future Appointments: Future Appointments  Date Time Provider [place]   09/09/2020  9:30 AM Rubie Maid, MD [facility] None  10/28/2020  8:00 AM Harlin Heys, MD [facility] None  11/23/2020 12:00 PM Ralene Bathe, MD [facility] None   Follow up Visit:  Follow-up Information    Rubie Maid, MD Follow up.   Specialties: Obstetrics and Gynecology, Radiology Why: Keep appointment for this Wednesday for BP check.  6 week postpartum appointment.  Contact information: [place] [id number] [phone]                   09/07/2020 Rubie Maid, MD [facility]

## 2020-09-07 NOTE — Progress Notes (Signed)
Newborn discharged home.  Discharge instructions and appointment given to and reviewed with parent.  Parent verbalized understanding. All testing completed. Tag removed, bands matched. Escorted by auxillary, carseat present.  

## 2020-09-07 NOTE — Discharge Instructions (Signed)
Postpartum Hypertension Postpartum hypertension is high blood pressure that remains higher than normal after childbirth. You may not realize that you have postpartum hypertension if your blood pressure is not being checked regularly. In most cases, postpartum hypertension will go away on its own, usually within a week of delivery. However, for some women, medical treatment is required to prevent serious complications, such as seizures or stroke. What are the causes? This condition may be caused by one or more of the following:  Hypertension that existed before pregnancy (chronic hypertension).  Hypertension that comes on as a result of pregnancy (gestational hypertension).  Hypertensive disorders during pregnancy (preeclampsia) or seizures in women who have high blood pressure during pregnancy (eclampsia).  A condition in which the liver, platelets, and red blood cells are damaged during pregnancy (HELLP syndrome).  A condition in which the thyroid produces too much hormones (hyperthyroidism).  Other rare problems of the nerves (neurological disorders) or blood disorders. In some cases, the cause may not be known. What increases the risk? The following factors may make you more likely to develop this condition:  Chronic hypertension. In some cases, this may not have been diagnosed before pregnancy.  Obesity.  Type 2 diabetes.  Kidney disease.  History of preeclampsia or eclampsia.  Other medical conditions that change the level of hormones in the body (hormonal imbalance). What are the signs or symptoms? As with all types of hypertension, postpartum hypertension may not have any symptoms. Depending on how high your blood pressure is, you may experience:  Headaches. These may be mild, moderate, or severe. They may also be steady, constant, or sudden in onset (thunderclap headache).  Changes in your ability to see (visual changes).  Dizziness.  Shortness of breath.  Swelling  of your hands, feet, lower legs, or face. In some cases, you may have swelling in more than one of these locations.  Heart palpitations or a racing heartbeat.  Difficulty breathing while lying down.  Decrease in the amount of urine that you pass. Other rare signs and symptoms may include:  Sweating more than usual. This lasts longer than a few days after delivery.  Chest pain.  Sudden dizziness when you get up from sitting or lying down.  Seizures.  Nausea or vomiting.  Abdominal pain. How is this diagnosed? This condition may be diagnosed based on the results of a physical exam, blood pressure measurements, and blood and urine tests. You may also have other tests, such as a CT scan or an MRI, to check for other problems of postpartum hypertension. How is this treated? If blood pressure is high enough to require treatment, your options may include:  Medicines to reduce blood pressure (antihypertensives). Tell your health care provider if you are breastfeeding or if you plan to breastfeed. There are many antihypertensive medicines that are safe to take while breastfeeding.  Stopping medicines that may be causing hypertension.  Treating medical conditions that are causing hypertension.  Treating the complications of hypertension, such as seizures, stroke, or kidney problems. Your health care provider will also continue to monitor your blood pressure closely until it is within a safe range for you. Follow these instructions at home:  Take over-the-counter and prescription medicines only as told by your health care provider.  Return to your normal activities as told by your health care provider. Ask your health care provider what activities are safe for you.  Do not use any products that contain nicotine or tobacco, such as cigarettes and e-cigarettes. If   you need help quitting, ask your health care provider.  Keep all follow-up visits as told by your health care provider. This  is important. Contact a health care provider if:  Your symptoms get worse.  You have new symptoms, such as: ? A headache that does not get better. ? Dizziness. ? Visual changes. Get help right away if:  You suddenly develop swelling in your hands, ankles, or face.  You have sudden, rapid weight gain.  You develop difficulty breathing, chest pain, racing heartbeat, or heart palpitations.  You develop severe pain in your abdomen.  You have any symptoms of a stroke. "BE FAST" is an easy way to remember the main warning signs of a stroke: ? B - Balance. Signs are dizziness, sudden trouble walking, or loss of balance. ? E - Eyes. Signs are trouble seeing or a sudden change in vision. ? F - Face. Signs are sudden weakness or numbness of the face, or the face or eyelid drooping on one side. ? A - Arms. Signs are weakness or numbness in an arm. This happens suddenly and usually on one side of the body. ? S - Speech. Signs are sudden trouble speaking, slurred speech, or trouble understanding what people say. ? T - Time. Time to call emergency services. Write down what time symptoms started.  You have other signs of a stroke, such as: ? A sudden, severe headache with no known cause. ? Nausea or vomiting. ? Seizure. These symptoms may represent a serious problem that is an emergency. Do not wait to see if the symptoms will go away. Get medical help right away. Call your local emergency services (911 in the [place] Do not drive yourself to the hospital. Summary  Postpartum hypertension is high blood pressure that remains higher than normal after childbirth.  In most cases, postpartum hypertension will go away on its own, usually within a week of delivery.  For some women, medical treatment is required to prevent serious complications, such as seizures or stroke. This information is not intended to replace advice given to you by your health care provider. Make sure you discuss any questions  you have with your health care provider. Document Revised: 11/02/2018 Document Reviewed: 07/17/2017 Elsevier Patient Education  2020 [employer]  

## 2020-09-09 ENCOUNTER — Encounter: Payer: Medicaid Other | Admitting: Obstetrics and Gynecology

## 2020-09-09 ENCOUNTER — Other Ambulatory Visit: Payer: Self-pay

## 2020-09-09 ENCOUNTER — Ambulatory Visit (INDEPENDENT_AMBULATORY_CARE_PROVIDER_SITE_OTHER): Payer: Medicaid Other | Admitting: Obstetrics and Gynecology

## 2020-09-09 ENCOUNTER — Encounter: Payer: Self-pay | Admitting: Obstetrics and Gynecology

## 2020-09-09 ENCOUNTER — Telehealth: Payer: Self-pay

## 2020-09-09 VITALS — BP 137/85 | HR 102 | Ht 67.0 in | Wt 264.7 lb

## 2020-09-09 DIAGNOSIS — Z0130 Encounter for examination of blood pressure without abnormal findings: Secondary | ICD-10-CM

## 2020-09-09 NOTE — Progress Notes (Signed)
Pt present for PP BP check. Pt is currently taking nifedipine SR 24 30 mg once daily. BP today: 137/85 p 102. Pt voiced that she was taking her bp medication daily as prescribed.

## 2020-09-09 NOTE — Telephone Encounter (Signed)
Spoke to pt concerning her coming to the office. Pt stated that she had an appointment with her child this morning. Pt stated that she could came back after her appointment with her child.

## 2020-09-09 NOTE — Telephone Encounter (Signed)
Patient walked into the office stating that she had an appointment with [place] informed her that she did not have an appt today. Patient stated that she was informed by her provider to still come to her appt on December 1st to have her BP checked, I informed patient that her providers nurse had actually cancelled that appointment and that I would send a message back to her provider to see what exactly she needs to be scheduled for. I informed patient once I knew I would give her a call to get her scheduled. Could you please advise?

## 2020-09-09 NOTE — Progress Notes (Signed)
I have reviewed the record and concur with patient management and plan.  Patient to continue her BP meds, had mild pre-eclampsia in pregnancy with recent delivery 5 days ago. Also with blood transfusion due to delayed postpartum hemorrhage. Pulse has improved. Patient remains asymptomatic. Follow up for 6 week postpartum visit. Continue BP medication (Procardia) until that time.   Sandy Laser, MD [facility]

## 2020-09-10 ENCOUNTER — Ambulatory Visit: Payer: Self-pay | Admitting: Obstetrics and Gynecology

## 2020-10-09 ENCOUNTER — Ambulatory Visit (INDEPENDENT_AMBULATORY_CARE_PROVIDER_SITE_OTHER): Payer: Medicaid Other | Admitting: Obstetrics and Gynecology

## 2020-10-09 ENCOUNTER — Encounter: Payer: Self-pay | Admitting: Obstetrics and Gynecology

## 2020-10-09 ENCOUNTER — Other Ambulatory Visit: Payer: Self-pay

## 2020-10-09 DIAGNOSIS — Z8759 Personal history of other complications of pregnancy, childbirth and the puerperium: Secondary | ICD-10-CM

## 2020-10-09 DIAGNOSIS — Z30013 Encounter for initial prescription of injectable contraceptive: Secondary | ICD-10-CM | POA: Diagnosis not present

## 2020-10-09 DIAGNOSIS — O9081 Anemia of the puerperium: Secondary | ICD-10-CM

## 2020-10-09 LAB — POCT URINE PREGNANCY: Preg Test, Ur: NEGATIVE

## 2020-10-09 NOTE — Progress Notes (Signed)
   OBSTETRICS POSTPARTUM CLINIC PROGRESS NOTE  Subjective:     Sandy Burns is a 19 y.o. G32P1001 female who presents for a postpartum visit. She is 6 weeks postpartum following a spontaneous vaginal delivery. I have fully reviewed the prenatal and intrapartum course. The delivery was at 38 gestational weeks, induction for pre-eclampsia without severe features. Immediate postpartum course was complicated by delayed postpartum hemorrhage requiring surgical intervention and evacuation of right vaginal hematoma. Required blood transfusion (3 units PRBCs and 1 unit FFP).  Anesthesia: none. Postpartum course has been well. Baby's course has been well. Baby is feeding by formula. Bleeding: patient has not resumed menses, with No LMP recorded (exact date).. Bowel function is normal. Bladder function is normal. Patient is not sexually active. Contraception method desired is Depo-Provera injections. Postpartum depression screening: negative (EPDS score is 8).  The following portions of the patient's history were reviewed and updated as appropriate: allergies, current medications, past family history, past medical history, past social history, past surgical history and problem list.  Review of Systems Pertinent items noted in HPI and remainder of comprehensive ROS otherwise negative.   Objective:    BP 138/88   Pulse 89   Ht 5\' 7"  (1.702 m)   Wt 247 lb 14.4 oz (112.4 kg)   LMP  (Exact Date)   Breastfeeding No   BMI 38.83 kg/m   General:  alert and no distress   Breasts:  inspection negative, no nipple discharge or bleeding, no masses or nodularity palpable  Lungs: clear to auscultation bilaterally  Heart:  regular rate and rhythm, S1, S2 normal, no murmur, click, rub or gallop  Abdomen: soft, non-tender; bowel sounds normal; no masses,  no organomegaly.    Vulva:  normal  Vagina: normal vagina, scant yellow-white discharge. No lesion, or erythema. Suture material visualized on right and left  vaginal walls. Small amount of granulation tissue noted at right vaginal fornix.   Cervix:  no cervical motion tenderness and no lesions  Corpus: normal size, contour, position, consistency, mobility, non-tender  Adnexa:  normal adnexa and no mass, fullness, tenderness  Rectal Exam: Not performed.         Labs:  Lab Results  Component Value Date   HGB 7.6 (L) 09/07/2020      Assessment:   1. Postpartum care following vaginal delivery   2. Delayed postpartum hemorrhage   3. Postpartum anemia   4. Encounter for initial prescription of injectable contraceptive   5. History of pre-eclampsia    Plan:   1. Contraception: Depo-Provera injections. Will prescribe. Patient to return next week for injection.  2. Will check Hgb for h/o anemia after postpartum hemorrhage requiring blood transfusion. 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 3. History of pre-eclampsia, previously on Procardia postpartum. Has discontinued meds, BPs wnl today.  4. Follow up in: 6 months or as needed.    09/09/2020, MD [facility]

## 2020-10-09 NOTE — Patient Instructions (Signed)
Medroxyprogesterone injection [Contraceptive] What is this medicine? MEDROXYPROGESTERONE (me DROX ee proe JES te rone) contraceptive injections prevent pregnancy. They provide effective birth control for 3 months. Depo-subQ Provera 104 is also used for treating pain related to endometriosis. This medicine may be used for other purposes; ask your health care provider or pharmacist if you have questions. COMMON BRAND NAME(S): Depo-Provera, Depo-subQ Provera 104 What should I tell my health care provider before I take this medicine? They need to know if you have any of these conditions:  frequently drink alcohol  asthma  blood vessel disease or a history of a blood clot in the lungs or legs  bone disease such as osteoporosis  breast cancer  diabetes  eating disorder (anorexia nervosa or bulimia)  high blood pressure  HIV infection or AIDS  kidney disease  liver disease  mental depression  migraine  seizures (convulsions)  stroke  tobacco smoker  vaginal bleeding  an unusual or allergic reaction to medroxyprogesterone, other hormones, medicines, foods, dyes, or preservatives  pregnant or trying to get pregnant  breast-feeding How should I use this medicine? Depo-Provera Contraceptive injection is given into a muscle. Depo-subQ Provera 104 injection is given under the skin. These injections are given by a health care professional. You must not be pregnant before getting an injection. The injection is usually given during the first 5 days after the start of a menstrual period or 6 weeks after delivery of a baby. Talk to your pediatrician regarding the use of this medicine in children. Special care may be needed. These injections have been used in female children who have started having menstrual periods. Overdosage: If you think you have taken too much of this medicine contact a poison control center or emergency room at once. NOTE: This medicine is only for you. Do not  share this medicine with others. What if I miss a dose? Try not to miss a dose. You must get an injection once every 3 months to maintain birth control. If you cannot keep an appointment, call and reschedule it. If you wait longer than 13 weeks between Depo-Provera contraceptive injections or longer than 14 weeks between Depo-subQ Provera 104 injections, you could get pregnant. Use another method for birth control if you miss your appointment. You may also need a pregnancy test before receiving another injection. What may interact with this medicine? Do not take this medicine with any of the following medications:  bosentan This medicine may also interact with the following medications:  aminoglutethimide  antibiotics or medicines for infections, especially rifampin, rifabutin, rifapentine, and griseofulvin  aprepitant  barbiturate medicines such as phenobarbital or primidone  bexarotene  carbamazepine  medicines for seizures like ethotoin, felbamate, oxcarbazepine, phenytoin, topiramate  modafinil  St. John's wort This list may not describe all possible interactions. Give your health care provider a list of all the medicines, herbs, non-prescription drugs, or dietary supplements you use. Also tell them if you smoke, drink alcohol, or use illegal drugs. Some items may interact with your medicine. What should I watch for while using this medicine? This drug does not protect you against HIV infection (AIDS) or other sexually transmitted diseases. Use of this product may cause you to lose calcium from your bones. Loss of calcium may cause weak bones (osteoporosis). Only use this product for more than 2 years if other forms of birth control are not right for you. The longer you use this product for birth control the more likely you will be at risk   for weak bones. Ask your health care professional how you can keep strong bones. You may have a change in bleeding pattern or irregular periods.  Many females stop having periods while taking this drug. If you have received your injections on time, your chance of being pregnant is very low. If you think you may be pregnant, see your health care professional as soon as possible. Tell your health care professional if you want to get pregnant within the next year. The effect of this medicine may last a long time after you get your last injection. What side effects may I notice from receiving this medicine? Side effects that you should report to your doctor or health care professional as soon as possible:  allergic reactions like skin rash, itching or hives, swelling of the face, lips, or tongue  breast tenderness or discharge  breathing problems  changes in vision  depression  feeling faint or lightheaded, falls  fever  pain in the abdomen, chest, groin, or leg  problems with balance, talking, walking  unusually weak or tired  yellowing of the eyes or skin Side effects that usually do not require medical attention (report to your doctor or health care professional if they continue or are bothersome):  acne  fluid retention and swelling  headache  irregular periods, spotting, or absent periods  temporary pain, itching, or skin reaction at site where injected  weight gain This list may not describe all possible side effects. Call your doctor for medical advice about side effects. You may report side effects to FDA at 1-800-FDA-1088. Where should I keep my medicine? This does not apply. The injection will be given to you by a health care professional. NOTE: This sheet is a summary. It may not cover all possible information. If you have questions about this medicine, talk to your doctor, pharmacist, or health care provider.  2020 Elsevier/Gold Standard (2008-10-17 18:37:56)  

## 2020-10-09 NOTE — Progress Notes (Signed)
PPV-Pt report she was breastfeeding and stopped; no sex, would like depo injection for birth control; upt negative no other issues.

## 2020-10-10 MED ORDER — MEDROXYPROGESTERONE ACETATE 150 MG/ML IM SUSP: 150.0000 mg | INTRAMUSCULAR | 3 refills | Status: DC

## 2020-10-12 ENCOUNTER — Ambulatory Visit (INDEPENDENT_AMBULATORY_CARE_PROVIDER_SITE_OTHER): Payer: Medicaid Other | Admitting: Obstetrics and Gynecology

## 2020-10-12 ENCOUNTER — Other Ambulatory Visit: Payer: Medicaid Other

## 2020-10-12 ENCOUNTER — Encounter: Payer: Self-pay | Admitting: Obstetrics and Gynecology

## 2020-10-12 ENCOUNTER — Other Ambulatory Visit: Payer: Self-pay

## 2020-10-12 DIAGNOSIS — Z30013 Encounter for initial prescription of injectable contraceptive: Secondary | ICD-10-CM

## 2020-10-12 MED ORDER — MEDROXYPROGESTERONE ACETATE 150 MG/ML IM SUSP
150.0000 mg | Freq: Once | INTRAMUSCULAR | Status: AC
  Administered 2020-10-12: 150 mg via INTRAMUSCULAR

## 2020-10-12 MED ORDER — MEDROXYPROGESTERONE ACETATE 150 MG/ML IM SUSP: 150.0000 mg | INTRAMUSCULAR | 0 refills | Status: DC

## 2020-10-12 NOTE — Progress Notes (Signed)
Date last pap: underaged UPT-NEG Last Depo-Provera: first injection. Side Effects if any: n/a Serum HCG indicated? none. Depo-Provera 150 mg IM given by: Shawn Route, LPN. Next appointment due March 21-January 11, 2021.

## 2020-10-13 LAB — HEMOGLOBIN AND HEMATOCRIT, BLOOD
Hematocrit: 36.6 % (ref 34.0–46.6)
Hemoglobin: 11.8 g/dL (ref 11.1–15.9)

## 2020-10-13 LAB — POCT URINE PREGNANCY: Preg Test, Ur: NEGATIVE

## 2020-10-18 ENCOUNTER — Other Ambulatory Visit: Payer: Self-pay | Admitting: Obstetrics and Gynecology

## 2020-10-19 NOTE — Telephone Encounter (Signed)
Can we find out the reason for this refill request? Is she having other symptoms that might be concerning for COVID infection?

## 2020-10-20 ENCOUNTER — Other Ambulatory Visit: Payer: Self-pay | Admitting: Obstetrics and Gynecology

## 2020-10-28 ENCOUNTER — Encounter: Payer: Self-pay | Admitting: Obstetrics and Gynecology

## 2020-10-28 ENCOUNTER — Ambulatory Visit (INDEPENDENT_AMBULATORY_CARE_PROVIDER_SITE_OTHER): Payer: Medicaid Other | Admitting: Obstetrics and Gynecology

## 2020-10-28 ENCOUNTER — Ambulatory Visit: Payer: Medicaid Other | Admitting: Dermatology

## 2020-10-28 ENCOUNTER — Other Ambulatory Visit: Payer: Self-pay

## 2020-10-28 VITALS — BP 121/86 | HR 101 | Ht 67.0 in | Wt 249.9 lb

## 2020-10-28 DIAGNOSIS — Z3009 Encounter for other general counseling and advice on contraception: Secondary | ICD-10-CM | POA: Diagnosis not present

## 2020-10-28 DIAGNOSIS — Z01419 Encounter for gynecological examination (general) (routine) without abnormal findings: Secondary | ICD-10-CM

## 2020-10-28 NOTE — Progress Notes (Signed)
HPI:      [name] is a 20 y.o. G1P1001 who LMP was No LMP recorded. Patient has had an injection.  Subjective:   She presents today for her annual examination.  She has no complaints.  She is using Depo for birth control.  She has not resumed intercourse. She has discontinued her blood pressure medication. She reports the baby is doing well.    Hx: The following portions of the patient's history were reviewed and updated as appropriate:             She  has a past medical history of Anemia and Asthma. She does not have any pertinent problems on file. She  has a past surgical history that includes Foot surgery (2015); Tooth extraction (2015); and Repair vaginal cuff (N/A, 09/06/2020). Her family history includes Hypertension in her mother; Lupus in her mother; Rheum arthritis in her mother; Schizophrenia in her father. She  reports that she has never smoked. She has never used smokeless tobacco. She reports that she does not drink alcohol and does not use drugs. She has a current medication list which includes the following prescription(s): advair diskus, benzaclin, epipen 2-pak, ferrous sulfate, fluticasone, medroxyprogesterone, metadate cd, pataday, vitafol ultra, proair hfa, and nifedipine. She is allergic to other.       Review of Systems:  Review of Systems  Constitutional: Denied constitutional symptoms, night sweats, recent illness, fatigue, fever, insomnia and weight loss.  Eyes: Denied eye symptoms, eye pain, photophobia, vision change and visual disturbance.  Ears/Nose/Throat/Neck: Denied ear, nose, throat or neck symptoms, hearing loss, nasal discharge, sinus congestion and sore throat.  Cardiovascular: Denied cardiovascular symptoms, arrhythmia, chest pain/pressure, edema, exercise intolerance, orthopnea and palpitations.  Respiratory: Denied pulmonary symptoms, asthma, pleuritic pain, productive sputum, cough, dyspnea and wheezing.  Gastrointestinal: Denied,  gastro-esophageal reflux, melena, nausea and vomiting.  Genitourinary: Denied genitourinary symptoms including symptomatic vaginal discharge, pelvic relaxation issues, and urinary complaints.  Musculoskeletal: Denied musculoskeletal symptoms, stiffness, swelling, muscle weakness and myalgia.  Dermatologic: Denied dermatology symptoms, rash and scar.  Neurologic: Denied neurology symptoms, dizziness, headache, neck pain and syncope.  Psychiatric: Denied psychiatric symptoms, anxiety and depression.  Endocrine: Denied endocrine symptoms including hot flashes and night sweats.   Meds:   Current Outpatient Medications on File Prior to Visit  Medication Sig Dispense Refill  . ADVAIR DISKUS 100-50 MCG/DOSE AEPB 1 puff 2 (two) times daily.    Marland Kitchen BENZACLIN gel Apply to face daily 50 g 4  . EPIPEN 2-PAK 0.3 MG/0.3ML SOAJ injection use as directed by prescriber if needed  0  . ferrous sulfate 324 MG TBEC Take 1 tablet (324 mg total) by mouth in the morning, at noon, and at bedtime. 90 tablet 1  . fluticasone (FLONASE) 50 MCG/ACT nasal spray   0  . medroxyPROGESTERone (DEPO-PROVERA) 150 MG/ML injection Inject 1 mL (150 mg total) into the muscle every 3 (three) months. 1 mL 3  . METADATE CD 20 MG CR capsule Take 20 mg by mouth every morning.   0  . PATADAY 0.2 % SOLN   0  . Prenat-Fe Poly-Methfol-FA-DHA (VITAFOL ULTRA) 29-0.6-0.4-200 MG CAPS Take 1 tablet by mouth daily. 30 capsule 11  . PROAIR HFA 108 (90 BASE) MCG/ACT inhaler inhale 1 to 2 puffs every 4 to 6 hours if needed for cough if needed  0  . NIFEdipine (ADALAT CC) 30 MG 24 hr tablet Take 1 tablet (30 mg total) by mouth daily. (Patient not taking: Reported on 10/28/2020)  60 tablet 0   No current facility-administered medications on file prior to visit.       Upstream - 10/28/20 0811      Pregnancy Intention Screening   Does the patient want to become pregnant in the next year? No    Does the patient's partner want to become pregnant in the  next year? No    Would the patient like to discuss contraceptive options today? No      Contraception Wrap Up   Current Method Hormonal Injection    End Method Hormonal Injection    Contraception Counseling Provided No          The pregnancy intention screening data noted above was reviewed. Potential methods of contraception were discussed. The patient elected to proceed with Hormonal Injection.     Objective:     Vitals:   10/28/20 0808  BP: 121/86  Pulse: (!) 101    Filed Weights   10/28/20 0808  Weight: 249 lb 14.4 oz (113.4 kg)              Physical examination General NAD, Conversant  HEENT Atraumatic; Op clear with mmm.  Normo-cephalic. Pupils reactive. Anicteric sclerae  Thyroid/Neck Smooth without nodularity or enlargement. Normal ROM.  Neck Supple.  Skin No rashes, lesions or ulceration. Normal palpated skin turgor. No nodularity.  Breasts: No masses or discharge.  Symmetric.  No axillary adenopathy.  Lungs: Clear to auscultation.No rales or wheezes. Normal Respiratory effort, no retractions.  Heart: NSR.  No murmurs or rubs appreciated. No periferal edema  Abdomen: Soft.  Non-tender.  No masses.  No HSM. No hernia  Extremities: Moves all appropriately.  Normal ROM for age. No lymphadenopathy.  Neuro: Oriented to PPT.  Normal mood. Normal affect.     Pelvic:   Vulva: Normal appearance.  No lesions.  Vagina: No lesions or abnormalities noted.  Small amount of granulation tissue noted in the right adnexa -no sutures noted  Support: Normal pelvic support.  Urethra No masses tenderness or scarring.  Meatus Normal size without lesions or prolapse.  Cervix: Normal appearance.  No lesions.  Anus: Normal exam.  No lesions.  Perineum: Normal exam.  No lesions.        Bimanual   Uterus: Normal size.  Non-tender.  Mobile.  AV.  Adnexae: No masses.  Non-tender to palpation.  Cul-de-sac: Negative for abnormality.      Assessment:    G1P1001 Patient Active  Problem List   Diagnosis Date Noted  . Delay 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/07/2020  . 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 Symptomatic anemia 09/07/2020  . Maternal blood transfusion 09/07/2020  . Obesity in pregnancy 09/07/2020  . Mild pre-eclampsia in third trimester 09/05/2020  . Pregnancy 08/22/2020  . Asthma affecting pregnancy in third trimester 08/18/2020  . Indication for care in labor or delivery 08/14/2020  . Labor and delivery, indication for care 07/19/2020     1. Well woman exam with routine gynecological exam   2. Birth control counseling        Plan:            1.  Basic Screening Recommendations The basic screening recommendations for asymptomatic women were discussed with the patient during her visit.  The age-appropriate recommendations were discussed with her and the rational for the tests reviewed.  When I am informed by the patient that another primary care physician has previously obtained the age-appropriate tests and they are up-to-date, only outstanding tests are ordered and referrals  given as necessary.  Abnormal results of tests will be discussed with her when all of her results are completed.  Routine preventative health maintenance measures emphasized: Exercise/Diet/Weight control, Tobacco Warnings, Alcohol/Substance use risks and Stress Management 2.  Patient would like to continue Depo for birth control. Orders No orders of the defined types were placed in this encounter.   No orders of the defined types were placed in this encounter.         F/U  Return in about 1 year (around 10/28/2021) for Annual Physical.  Elonda Husky, M.D. 10/28/2020 8:38 AM

## 2020-10-29 DIAGNOSIS — J301 Allergic rhinitis due to pollen: Secondary | ICD-10-CM | POA: Diagnosis not present

## 2020-10-30 ENCOUNTER — Other Ambulatory Visit: Payer: Self-pay

## 2020-10-30 ENCOUNTER — Encounter: Payer: Self-pay | Admitting: Emergency Medicine

## 2020-10-30 ENCOUNTER — Emergency Department
Admission: EM | Admit: 2020-10-30 | Discharge: 2020-10-30 | Disposition: A | Discharge status: Home / Self Care | Payer: Medicaid Other | Attending: Emergency Medicine | Admitting: Emergency Medicine

## 2020-10-30 DIAGNOSIS — J3089 Other allergic rhinitis: Secondary | ICD-10-CM | POA: Diagnosis not present

## 2020-10-30 DIAGNOSIS — R0981 Nasal congestion: Secondary | ICD-10-CM | POA: Diagnosis present

## 2020-10-30 DIAGNOSIS — J3081 Allergic rhinitis due to animal (cat) (dog) hair and dander: Secondary | ICD-10-CM | POA: Diagnosis not present

## 2020-10-30 DIAGNOSIS — U071 COVID-19: Secondary | ICD-10-CM | POA: Insufficient documentation

## 2020-10-30 DIAGNOSIS — J45909 Unspecified asthma, uncomplicated: Secondary | ICD-10-CM | POA: Insufficient documentation

## 2020-10-30 LAB — POC SARS CORONAVIRUS 2 AG -  ED: SARS Coronavirus 2 Ag: POSITIVE — AB

## 2020-10-30 LAB — SARS CORONAVIRUS 2 BY RT PCR (HOSPITAL ORDER, PERFORMED IN ~~LOC~~ HOSPITAL LAB): SARS Coronavirus 2: POSITIVE — AB

## 2020-10-30 MED ORDER — IBUPROFEN 600 MG PO TABS
600.0000 mg | ORAL_TABLET | Freq: Once | ORAL | Status: AC
  Administered 2020-10-30: 600 mg via ORAL
  Filled 2020-10-30: qty 1

## 2020-10-30 NOTE — Discharge Instructions (Addendum)
Post- COVID Clinic ° °[phone] ° °QUARANTINE INSTRUCTION ° °Follow these instructions at home: ° °Protecting others °To avoid spreading the illness to other people: °Quarantine in your home until you have had no cough and fever for 7 days. Household members should also be quarantine for at least 14 days after being exposed to you. °Wash your hands often with soap and water. If soap and water are not available, use an alcohol-based hand sanitizer. If you have not cleaned your hands, do not touch your face. °Make sure that all people in your household wash their hands well and often. °Cover your nose and mouth when you cough or sneeze. °Throw away used tissues. °Stay home if you have any cold-like or flu-like symptoms. °General instructions °Go to your local pharmacy and buy a pulse oximeter (this is a machine that measures your oxygen). Check your oxygen levels at least 3 times a day. If your oxygen level is 90% or less return to the emergency room immediately °Take over-the-counter and prescription medicines only as told by your health care provider. °If you need medication for fever take tylenol or ibuprofen °Drink enough fluid to keep your urine pale yellow. °Rest at home as directed by your health care provider. °Do not give aspirin to a child with the flu, because of the association with Reye's syndrome. °Do not use tobacco products, including cigarettes, chewing tobacco, and e-cigarettes. If you need help quitting, ask your health care provider. °Keep all follow-up visits as told by your health care provider. This is important. °How is this prevented? °Avoid areas where an outbreak has been reported. °Avoid large groups of people. °Keep a safe distance from people who are coughing and sneezing. °Do not touch your face if you have not cleaned your hands. °When you are around people who are sick or might be sick, wear a mask to protect yourself. °Contact a health care provider if: °You have symptoms of SARS  (cough, fever, chest pain, shortness of breath) that are not getting better at home. °You have a fever. °If you have difficulty breathing go to your local ER or call 911  ° °To help boost your immune system against COVID-19, please take: ° °- Vitamin D3 4,000 IU/day °- Vitamin C 500-1,000?mg twice a day °- Quercetin 250?mg twice a day °- Zinc 100?mg/day °- Melatonin 10?mg before bedtime (causes drowsiness) °- Aspirin 325?mg/day (unless contraindicated) °- Pulse Oximeter Monitoring of oxygen saturation is recommended - check your oxygen 3 times a day if less than 90% return to the ER ° °These medications are all over-the-counter and do not need a prescription. ° °

## 2020-10-30 NOTE — ED Provider Notes (Signed)
[facility] Emergency Department Provider Note  ____________________________________________  Time seen: Approximately 6:40 AM  I have reviewed the triage vital signs and the nursing notes.   HISTORY  Chief Complaint Nasal Congestion   HPI Sandy Burns is a 20 y.o. female with a history of anemia and asthma who presents for evaluation of nasal congestion and cough.  Patient reports that her symptoms started 5 days ago.  She has a cough productive of yellow sputum, congestion, sore throat, and runny nose.  No chest pain, no shortness of breath, no nausea or vomiting, no fever or chills.  No known exposures to COVID.  She is not vaccinated.  She is here with her 67-week-old baby who has had similar symptoms.   Past Medical History:  Diagnosis Date  . Anemia   . Asthma     Patient Active Problem List   Diagnosis Date Noted  . Delay 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/07/2020  . 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 Symptomatic anemia 09/07/2020  . Maternal blood transfusion 09/07/2020  . Obesity in pregnancy 09/07/2020  . Mild pre-eclampsia in third trimester 09/05/2020  . Pregnancy 08/22/2020  . Asthma affecting pregnancy in third trimester 08/18/2020  . Indication for care in labor or delivery 08/14/2020  . Labor and delivery, indication for care 07/19/2020    Past Surgical History:  Procedure Laterality Date  . FOOT SURGERY  2015  . REPAIR VAGINAL CUFF N/A 09/06/2020   Procedure: REPAIR VAGINAL CUFF;  Surgeon: Hildred Laser, MD;  Location: [facility] ORS;  Service: Gynecology;  Laterality: N/A;  . TOOTH EXTRACTION  2015    Prior to Admission medications   Medication Sig Start Date End Date Taking? Authorizing Provider  ADVAIR DISKUS 100-50 MCG/DOSE AEPB 1 puff 2 (two) times daily. 10/24/19   [provider]  BENZACLIN gel Apply to face daily 08/10/20   Deirdre Evener, MD  EPIPEN 2-PAK 0.3 MG/0.3ML SOAJ injection use as directed by prescriber if  needed 04/30/15   [provider]  ferrous sulfate 324 MG TBEC Take 1 tablet (324 mg total) by mouth in the morning, at noon, and at bedtime. 09/07/20   Hildred Laser, MD  fluticasone Aleda Grana) 50 MCG/ACT nasal spray  03/02/15   [provider]  medroxyPROGESTERone (DEPO-PROVERA) 150 MG/ML injection Inject 1 mL (150 mg total) into the muscle every 3 (three) months. 10/10/20   Hildred Laser, MD  METADATE CD 20 MG CR capsule Take 20 mg by mouth every morning.  04/30/15   [provider]  NIFEdipine (ADALAT CC) 30 MG 24 hr tablet Take 1 tablet (30 mg total) by mouth daily. Patient not taking: Reported on 10/28/2020 09/07/20   Hildred Laser, MD  PATADAY 0.2 % SOLN  03/15/15   [provider]  Prenat-Fe Poly-Methfol-FA-DHA (VITAFOL ULTRA) 29-0.6-0.4-200 MG CAPS Take 1 tablet by mouth daily. 01/24/20   Linzie Collin, MD  PROAIR HFA 108 [phone] BASE) MCG/ACT inhaler inhale 1 to 2 puffs every 4 to 6 hours if needed for cough if needed 04/30/15   [provider]    Allergies Other  Family History  Problem Relation Age of Onset  . Rheum arthritis Mother   . Hypertension Mother   . Lupus Mother   . Schizophrenia Father     Social History Social History   Tobacco Use  . Smoking status: Never Smoker  . Smokeless tobacco: Never Used  Vaping Use  . Vaping Use: Never used  Substance Use Topics  .  Alcohol use: Never    Alcohol/week: 0.0 standard drinks  . Drug use: Never    Review of Systems  Constitutional: Negative for fever. Eyes: Negative for visual changes. ENT: + sore throat, congestion Neck: No neck pain  Cardiovascular: Negative for chest pain. Respiratory: Negative for shortness of breath. + productive cough Gastrointestinal: Negative for abdominal pain, vomiting or diarrhea. Genitourinary: Negative for dysuria. Musculoskeletal: Negative for back pain. Skin: Negative for rash. Neurological: Negative for headaches, weakness or  numbness. Psych: No SI or HI  ____________________________________________   PHYSICAL EXAM:  VITAL SIGNS: ED Triage Vitals  Enc Vitals Group     BP 10/30/20 0050 132/70     Pulse Rate 10/30/20 0050 80     Resp 10/30/20 0050 18     Temp 10/30/20 0050 98.1 F (36.7 C)     Temp Source 10/30/20 0050 Oral     SpO2 10/30/20 0050 100 %     Weight 10/30/20 0055 248 lb (112.5 kg)     Height 10/30/20 0055 5\' 7"  (1.702 m)     Head Circumference --      Peak Flow --      Pain Score 10/30/20 0055 0     Pain Loc --      Pain Edu? --      Excl. in GC? --     Constitutional: Alert and oriented. Well appearing and in no apparent distress. HEENT:      Head: Normocephalic and atraumatic.         Eyes: Conjunctivae are normal. Sclera is non-icteric.       Mouth/Throat: Mucous membranes are moist.       Neck: Supple with no signs of meningismus. Cardiovascular: Regular rate and rhythm. No murmurs, gallops, or rubs.  Respiratory: Normal respiratory effort. Lungs are clear to auscultation bilaterally.  Gastrointestinal: Soft, non tender, and non distended. Musculoskeletal:  No edema, cyanosis, or erythema of extremities. Neurologic: Normal speech and language. Face is symmetric. Moving all extremities. No gross focal neurologic deficits are appreciated. Skin: Skin is warm, dry and intact. No rash noted. Psychiatric: Mood and affect are normal. Speech and behavior are normal.  ____________________________________________   LABS (all labs ordered are listed, but only abnormal results are displayed)  Labs Reviewed  SARS CORONAVIRUS 2 BY RT PCR (HOSPITAL ORDER, PERFORMED IN  HOSPITAL LAB) - Abnormal; Notable for the following components:      Result Value   SARS Coronavirus 2 POSITIVE (*)    All other components within normal limits  POC SARS CORONAVIRUS 2 AG -  ED - Abnormal; Notable for the following components:   SARS Coronavirus 2 Ag POSITIVE (*)    All other components  within normal limits   ____________________________________________  EKG  none  ____________________________________________  RADIOLOGY  none  ____________________________________________   PROCEDURES  Procedure(s) performed: None Procedures Critical Care performed:  None ____________________________________________   INITIAL IMPRESSION / ASSESSMENT AND PLAN / ED COURSE   20 y.o. female with a history of anemia and asthma who presents for evaluation of nasal congestion, cough, sore throat x 5 days.  Patient is here with her 66-week-old infant who has similar symptoms.  She tested positive for COVID.  She has normal work of breathing, normal sats both at rest and with ambulation.  Normal vital signs.  No chest pain, no shortness of breath, no tachypnea, no tachycardia, no hypoxia.  Discussed oxygen monitoring at home, quarantine, immune system boosting, and follow-up at the post-COVID  clinic.  Discussed my standard return precautions.      _____________________________________________ Please note:  Patient was evaluated in Emergency Department today for the symptoms described in the history of present illness. Patient was evaluated in the context of the global COVID-19 pandemic, which necessitated consideration that the patient might be at risk for infection with the SARS-CoV-2 virus that causes COVID-19. Institutional protocols and algorithms that pertain to the evaluation of patients at risk for COVID-19 are in a state of rapid change based on information released by regulatory bodies including the CDC and federal and state organizations. These policies and algorithms were followed during the patient's care in the ED.  Some ED evaluations and interventions may be delayed as a result of limited staffing during the pandemic.   [place] Controlled Substance Database was reviewed by me. ____________________________________________   FINAL CLINICAL IMPRESSION(S) / ED DIAGNOSES   Final  diagnoses:  COVID-19      NEW MEDICATIONS STARTED DURING THIS VISIT:  ED Discharge Orders    None       Note:  This document was prepared using Dragon voice recognition software and may include unintentional dictation errors.    Nita Sickle, MD 10/30/20 [phone]

## 2020-10-30 NOTE — ED Triage Notes (Signed)
Patient ambulatory to triage with steady gait, without difficulty or distress noted; pt st last Saturday began having runny nose & congestion with prod cough yellow sputum

## 2020-11-19 DIAGNOSIS — J3081 Allergic rhinitis due to animal (cat) (dog) hair and dander: Secondary | ICD-10-CM | POA: Diagnosis not present

## 2020-11-19 DIAGNOSIS — J3089 Other allergic rhinitis: Secondary | ICD-10-CM | POA: Diagnosis not present

## 2020-11-19 DIAGNOSIS — J301 Allergic rhinitis due to pollen: Secondary | ICD-10-CM | POA: Diagnosis not present

## 2020-11-23 ENCOUNTER — Other Ambulatory Visit: Payer: Self-pay

## 2020-11-23 ENCOUNTER — Ambulatory Visit (INDEPENDENT_AMBULATORY_CARE_PROVIDER_SITE_OTHER): Payer: Medicaid Other | Admitting: Dermatology

## 2020-11-23 DIAGNOSIS — L308 Other specified dermatitis: Secondary | ICD-10-CM

## 2020-11-23 DIAGNOSIS — L700 Acne vulgaris: Secondary | ICD-10-CM

## 2020-11-23 MED ORDER — BENZACLIN 1-5 % EX GEL: CUTANEOUS | 4 refills | Status: DC

## 2020-11-23 MED ORDER — MOMETASONE FUROATE 0.1 % EX CREA: 1.0000 "application " | TOPICAL_CREAM | Freq: Every day | CUTANEOUS | 2 refills | Status: DC

## 2020-11-23 MED ORDER — TRETINOIN 0.1 % EX CREA: TOPICAL_CREAM | Freq: Every evening | CUTANEOUS | 5 refills | Status: DC

## 2020-11-23 NOTE — Patient Instructions (Addendum)
Topical retinoid medications like tretinoin/Retin-A, adapalene/Differin, tazarotene/Fabior, and Epiduo/Epiduo Forte can cause dryness and irritation when first started. Only apply a pea-sized amount to the entire affected area. Avoid applying it around the eyes, edges of mouth and creases at the nose. If you experience irritation, use a good moisturizer first and/or apply the medicine less often. If you are doing well with the medicine, you can increase how often you use it until you are applying every night. Be careful with sun protection while using this medication as it can make you sensitive to the sun. This medicine should not be used by pregnant women.    Topical steroids (such as triamcinolone, fluocinolone, fluocinonide, mometasone, clobetasol, halobetasol, betamethasone, hydrocortisone) can cause thinning and lightening of the skin if they are used for too long in the same area. Your physician has selected the right strength medicine for your problem and area affected on the body. Please use your medication only as directed by your physician to prevent side effects.

## 2020-11-23 NOTE — Progress Notes (Signed)
   Follow-Up Visit   Subjective  Sandy Burns is a 20 y.o. female who presents for the following: Acne (Patient here today for acne follow up. She restarted tretinoin 0.05% and BenzaClin after giving birth in November. Patient is not breastfeeding. She advises that medications have not helped acne, present at face, chest. ).  The following portions of the chart were reviewed this encounter and updated as appropriate:   Tobacco  Allergies  Meds  Problems  Med Hx  Surg Hx  Fam Hx     Review of Systems:  No other skin or systemic complaints except as noted in HPI or Assessment and Plan.  Objective  Well appearing patient in no apparent distress; mood and affect are within normal limits.  A focused examination was performed including face, neck, chest and back. Relevant physical exam findings are noted in the Assessment and Plan.  Objective  face: Mild comedones  Objective  chest and neck: Erythema and scale at ant neck and chest   Assessment & Plan  Acne vulgaris -chronic; persistent and not to goal face Continue BenzaClin QAM Change Retin-A increasing to 0.1% QHS as tolerated  Topical retinoid medications like tretinoin/Retin-A, adapalene/Differin, tazarotene/Fabior, and Epiduo/Epiduo Forte can cause dryness and irritation when first started. Only apply a pea-sized amount to the entire affected area. Avoid applying it around the eyes, edges of mouth and creases at the nose. If you experience irritation, use a good moisturizer first and/or apply the medicine less often. If you are doing well with the medicine, you can increase how often you use it until you are applying every night. Be careful with sun protection while using this medication as it can make you sensitive to the sun. This medicine should not be used by pregnant women.   Ordered Medications: tretinoin (RETIN-A) 0.1 % cream  Reordered Medications BENZACLIN gel  Eczema chest and neck Vs Contact  Dermatitis Start mometasone cream once daily until clear then recommend Vaseline to protect skin as a skin barrier.  If persistent or recurrent problems and if it does not seem to be related to irritant from her acne medications, consider patch testing with True Test  Topical steroids (such as triamcinolone, fluocinolone, fluocinonide, mometasone, clobetasol, halobetasol, betamethasone, hydrocortisone) can cause thinning and lightening of the skin if they are used for too long in the same area. Your physician has selected the right strength medicine for your problem and area affected on the body. Please use your medication only as directed by your physician to prevent side effects.   Ordered Medications: mometasone (ELOCON) 0.1 % cream  Return in about 6 months (around 05/23/2021) for Acne.  Anise Salvo, RMA, am acting as scribe for Armida Sans, MD . Documentation: I have reviewed the above documentation for accuracy and completeness, and I agree with the above.  Armida Sans, MD

## 2020-11-24 ENCOUNTER — Encounter: Payer: Self-pay | Admitting: Dermatology

## 2020-12-02 ENCOUNTER — Other Ambulatory Visit: Payer: Self-pay

## 2020-12-02 DIAGNOSIS — L700 Acne vulgaris: Secondary | ICD-10-CM

## 2020-12-02 MED ORDER — CLINDAMYCIN PHOS-BENZOYL PEROX 1.2-2.5 % EX GEL: 1.0000 "application " | Freq: Every day | CUTANEOUS | 4 refills | Status: DC

## 2020-12-02 NOTE — Progress Notes (Signed)
Benzaclin brand not carried by pharmcay's supplier any longer. Generic sent.

## 2021-01-07 ENCOUNTER — Ambulatory Visit: Payer: Self-pay | Admitting: Physician Assistant

## 2021-01-07 ENCOUNTER — Ambulatory Visit: Payer: Medicaid Other

## 2021-01-08 ENCOUNTER — Encounter: Payer: Self-pay | Admitting: Hospice and Palliative Medicine

## 2021-01-08 ENCOUNTER — Other Ambulatory Visit: Payer: Self-pay

## 2021-01-08 ENCOUNTER — Ambulatory Visit (INDEPENDENT_AMBULATORY_CARE_PROVIDER_SITE_OTHER): Payer: Medicaid Other | Admitting: Hospice and Palliative Medicine

## 2021-01-08 ENCOUNTER — Ambulatory Visit (INDEPENDENT_AMBULATORY_CARE_PROVIDER_SITE_OTHER): Payer: Medicaid Other

## 2021-01-08 VITALS — BP 130/84 | HR 94 | Temp 98.1°F | Resp 16 | Ht 67.0 in | Wt 266.6 lb

## 2021-01-08 DIAGNOSIS — J45909 Unspecified asthma, uncomplicated: Secondary | ICD-10-CM

## 2021-01-08 DIAGNOSIS — Z7689 Persons encountering health services in other specified circumstances: Secondary | ICD-10-CM | POA: Diagnosis not present

## 2021-01-08 DIAGNOSIS — Z30013 Encounter for initial prescription of injectable contraceptive: Secondary | ICD-10-CM

## 2021-01-08 DIAGNOSIS — R5383 Other fatigue: Secondary | ICD-10-CM | POA: Diagnosis not present

## 2021-01-08 MED ORDER — ADVAIR DISKUS 100-50 MCG/DOSE IN AEPB: 1.0000 | INHALATION_SPRAY | Freq: Two times a day (BID) | RESPIRATORY_TRACT | 2 refills | Status: DC

## 2021-01-08 MED ORDER — MEDROXYPROGESTERONE ACETATE 150 MG/ML IM SUSP
150.0000 mg | Freq: Once | INTRAMUSCULAR | Status: AC
  Administered 2021-01-08: 150 mg via INTRAMUSCULAR

## 2021-01-08 MED ORDER — PROAIR HFA 108 (90 BASE) MCG/ACT IN AERS
INHALATION_SPRAY | RESPIRATORY_TRACT | 2 refills | Status: DC
Start: 2021-01-08 — End: 2021-05-10

## 2021-01-08 NOTE — Progress Notes (Signed)
[facility] [address] [place], [place] [address]  Internal MEDICINE  Office Visit Note  Patient Name: Sandy Burns  [id number]  [id number]  Date of Service: 01/11/2021   Complaints/HPI Pt is here for establishment of PCP. Chief Complaint  Patient presents with  . New Patient (Initial Visit)    Establish care  . Quality Metric Gaps    Hep C screen, HPV vaccines   HPI  Patient is here to establish PCP care Has not been seen or managed by PCP in several years, most recent management was from GYN during her pregnancy [place] manages allergies--supposed to receive weekly allergy injections but has not been back to office since February to get injection Allergies and breathing remain well controlled, denies symptoms of shortness of breath or wheezing Placed on Nifedipine by GYN for preeclampsia Burns longer taking after giving birth to her son Only daily medications at this time are Zyrtec and Advair both for allergy induced asthma History of eczema--managed by dermatology  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 cetirizine (ZYRTEC) 5 MG tablet Take 5 mg by mouth daily.   . Clindamycin Phos-Benzoyl Perox gel Apply 1 application topically daily.   Marland Kitchen EPIPEN 2-PAK 0.3 MG/0.3ML SOAJ injection use as directed by prescriber if needed 05/20/2015: Received from: External Pharmacy  . fluticasone (FLONASE) 50 MCG/ACT nasal spray  03/30/2015: Received from: External Pharmacy   . medroxyPROGESTERone (DEPO-PROVERA) 150 MG/ML injection Inject 1 mL (150 mg total) into the muscle every 3 (three) months.   . mometasone (ELOCON) 0.1 % cream Apply 1 application topically daily. Until clear to chest and neck for rash. Avoid applying to face, groin, and axilla. Use as directed. Risk of skin atrophy with long-term use reviewed.   Marland Kitchen PATADAY 0.2 % SOLN  03/30/2015: Received from: External Pharmacy  . tretinoin (RETIN-A) 0.1 % cream Apply topically at bedtime.   . [DISCONTINUED] ADVAIR DISKUS 100-50 MCG/DOSE AEPB 1 puff 2 (two) times daily.   . [DISCONTINUED] METADATE CD 20 MG CR capsule Take 20 mg by mouth every morning.  05/20/2015: Received from: External Pharmacy  . [DISCONTINUED] NIFEdipine (ADALAT CC) 30 MG 24 hr tablet Take 1 tablet (30 mg total) by mouth daily.   . [DISCONTINUED] PROAIR HFA 108 (90 BASE) MCG/ACT inhaler inhale 1 to 2 puffs every 4 to 6 hours if needed for cough if needed 05/20/2015: Received from: External Pharmacy  . ADVAIR DISKUS 100-50 MCG/DOSE AEPB Inhale 1 puff into the lungs 2 (two) times daily.   Marland Kitchen PROAIR HFA 108 (90 Base) MCG/ACT inhaler inhale 1 to 2 puffs every 4 to 6 hours if needed for cough if needed   . [DISCONTINUED] ferrous sulfate 324 MG TBEC Take 1 tablet (324 mg total) by mouth in the morning, at noon, and at bedtime. (Patient not taking: Reported on 01/08/2021)   . [DISCONTINUED] Prenat-Fe Poly-Methfol-FA-DHA (VITAFOL ULTRA) 29-0.6-0.4-200 MG CAPS Take 1 tablet by mouth daily. (Patient not taking: Reported on 01/08/2021)    Burns facility-administered encounter medications on file as of  01/08/2021.    Surgical History: Past Surgical History:  Procedure Laterality Date  . FOOT SURGERY  2015  . REPAIR VAGINAL CUFF N/A 09/06/2020   Procedure: REPAIR VAGINAL CUFF;  Surgeon: Hildred Laser, MD;  Location: [facility] ORS;  Service: Gynecology;  Laterality: N/A;  . TOOTH EXTRACTION  2015    Medical History: Past Medical History:  Diagnosis Date  . Anemia    . Asthma   . Eczema   . Environmental allergies   . History of blood transfusion    after giving birth 09/06/2020    Family History: Family History  Problem Relation Age of Onset  . Rheum arthritis Mother   . Hypertension Mother   . Lupus Mother   . Schizophrenia Father     Social History   Socioeconomic History  . Marital status: Single    Spouse name: Not on file  . Number of children: Not on file  . Years of education: Not on file  . Highest education level: Not on file  Occupational History  . Not on file  Tobacco Use  . Smoking status: Never Smoker  . Smokeless tobacco: Never Used  Vaping Use  . Vaping Use: Never used  Substance and Sexual Activity  . Alcohol use: Never    Alcohol/week: 0.0 standard drinks  . Drug use: Never  . Sexual activity: Not Currently    Comment: undecided  Other Topics Concern  . Not on file  Social History Narrative  . Not on file   Social Determinants of Health   Financial Resource Strain: Not on file  Food Insecurity: Not on file  Transportation Needs: Not on file  Physical Activity: Not on file  Stress: Not on file  Social Connections: Not on file  Intimate Partner Violence: Not on file     Review of Systems  Constitutional: Negative for chills, diaphoresis and fatigue.  HENT: Negative for ear pain, postnasal drip and sinus pressure.   Eyes: Negative for photophobia, discharge, redness, itching and visual disturbance.  Respiratory: Negative for cough, shortness of breath and wheezing.   Cardiovascular: Negative for chest pain, palpitations and leg swelling.  Gastrointestinal: Negative for abdominal pain, constipation, diarrhea, nausea and vomiting.  Genitourinary: Negative for dysuria and flank pain.  Musculoskeletal: Negative for arthralgias, back pain, gait problem and neck pain.  Skin: Negative for color change.  Allergic/Immunologic: Negative for environmental allergies and food allergies.  Neurological:  Negative for dizziness and headaches.  Hematological: Does not bruise/bleed easily.  Psychiatric/Behavioral: Negative for agitation, behavioral problems (depression) and hallucinations.    Vital Signs: BP 130/84   Pulse 94   Temp 98.1 F (36.7 C)   Resp 16   Ht 5\' 7"  (1.702 m)   Wt 266 lb 9.6 oz (120.9 kg)   SpO2 99%   BMI 41.76 kg/m    Physical Exam Vitals reviewed.  Constitutional:      Appearance: Normal appearance. She is obese.  Cardiovascular:     Rate and Rhythm: Normal rate and regular rhythm.     Pulses: Normal pulses.     Heart sounds: Normal heart sounds.  Pulmonary:     Effort: Pulmonary effort is normal.     Breath sounds: Normal breath sounds.  Abdominal:     General: Abdomen is flat.     Palpations: Abdomen is soft.  Musculoskeletal:        General: Normal range of motion.     Cervical back: Normal range of motion.  Skin:  General: Skin is warm.  Neurological:     General: Burns focal deficit present.     Mental Status: She is alert and oriented to person, place, and time. Mental status is at baseline.  Psychiatric:        Mood and Affect: Mood normal.        Behavior: Behavior normal.        Thought Content: Thought content normal.        Judgment: Judgment normal.    Assessment/Plan: 1. Encounter to establish care with new doctor Well appearing 20 year old AA female Will review routine labs for baseline and adjust therapy as indicated  2. Extrinsic asthma without complication, unspecified asthma severity, unspecified whether persistent Requesting refills, allergies managed by [place]  Will need PFT - PROAIR HFA 108 (90 Base) MCG/ACT inhaler; inhale 1 to 2 puffs every 4 to 6 hours if needed for cough if needed  Dispense: 18 g; Refill: 2 - ADVAIR DISKUS 100-50 MCG/DOSE AEPB; Inhale 1 puff into the lungs 2 (two) times daily.  Dispense: 60 each; Refill: 2  3. Morbid obesity (HCC) BMI 41--discussed potential weight loss options but would like to  review baseline labs prior to initiating therapy Discussed importance of weight loss is calorie deficiency and physical activity Obesity Counseling: Risk Assessment: An assessment of behavioral risk factors was made today and includes lack of exercise sedentary lifestyle, lack of portion control and poor dietary habits.  Risk Modification Advice: She was counseled on portion control guidelines. Restricting daily caloric intake to 1800. The detrimental long term effects of obesity on her health and ongoing poor compliance was also discussed with the patient.  4. Other fatigue - CBC w/Diff/Platelet - Comprehensive Metabolic Panel (CMET) - Lipid Panel With LDL/HDL Ratio - TSH + free T4  General Counseling: Sydna verbalizes understanding of the findings of todays visit and agrees with plan of treatment. I have discussed any further diagnostic evaluation that may be needed or ordered today. We also reviewed her medications today. she has been encouraged to call the office with any questions or concerns that should arise related to todays visit.  Orders Placed This Encounter  Procedures  . CBC w/Diff/Platelet  . Comprehensive Metabolic Panel (CMET)  . Lipid Panel With LDL/HDL Ratio  . TSH + free T4    Meds ordered this encounter  Medications  . PROAIR HFA 108 (90 Base) MCG/ACT inhaler    Sig: inhale 1 to 2 puffs every 4 to 6 hours if needed for cough if needed    Dispense:  18 g    Refill:  2  . ADVAIR DISKUS 100-50 MCG/DOSE AEPB    Sig: Inhale 1 puff into the lungs 2 (two) times daily.    Dispense:  60 each    Refill:  2    Time spent: 30 Minutes Time spent includes review of chart, medications, test results and follow-up plan with the patient.  This patient was seen by Leeanne Deed AGNP-C in Collaboration with Dr Lyndon Code as a part of collaborative care agreement  Leeanne Deed [facility] Internal Medicine

## 2021-01-08 NOTE — Progress Notes (Signed)
Date last pap: underage. Last Depo-Provera: 10/12/2020 . Side Effects if any: none Serum HCG indicated? n/a . Depo-Provera 150 mg IM given by: FHampton, LPN. Next appointment due June 17-April 09, 2021.

## 2021-01-11 ENCOUNTER — Encounter: Payer: Self-pay | Admitting: Hospice and Palliative Medicine

## 2021-01-22 DIAGNOSIS — R5383 Other fatigue: Secondary | ICD-10-CM | POA: Diagnosis not present

## 2021-01-22 DIAGNOSIS — E782 Mixed hyperlipidemia: Secondary | ICD-10-CM | POA: Diagnosis not present

## 2021-01-23 LAB — CBC WITH DIFFERENTIAL/PLATELET
Basophils Absolute: 0.1 10*3/uL (ref 0.0–0.2)
Basos: 1 %
EOS (ABSOLUTE): 0.2 10*3/uL (ref 0.0–0.4)
Eos: 3 %
Hematocrit: 38.1 % (ref 34.0–46.6)
Hemoglobin: 12.7 g/dL (ref 11.1–15.9)
Immature Grans (Abs): 0 10*3/uL (ref 0.0–0.1)
Immature Granulocytes: 0 %
Lymphocytes Absolute: 2.4 10*3/uL (ref 0.7–3.1)
Lymphs: 32 %
MCH: 28.2 pg (ref 26.6–33.0)
MCHC: 33.3 g/dL (ref 31.5–35.7)
MCV: 85 fL (ref 79–97)
Monocytes Absolute: 0.6 10*3/uL (ref 0.1–0.9)
Monocytes: 9 %
Neutrophils Absolute: 4.2 10*3/uL (ref 1.4–7.0)
Neutrophils: 55 %
Platelets: 340 10*3/uL (ref 150–450)
RBC: 4.51 x10E6/uL (ref 3.77–5.28)
RDW: 13.7 % (ref 11.7–15.4)
WBC: 7.6 10*3/uL (ref 3.4–10.8)

## 2021-01-23 LAB — TSH+FREE T4
Free T4: 1.11 ng/dL (ref 0.82–1.77)
TSH: 1.9 u[IU]/mL (ref 0.450–4.500)

## 2021-01-23 LAB — COMPREHENSIVE METABOLIC PANEL
ALT: 19 IU/L (ref 0–32)
AST: 15 IU/L (ref 0–40)
Albumin/Globulin Ratio: 1.4 (ref 1.2–2.2)
Albumin: 4.4 g/dL (ref 3.9–5.0)
Alkaline Phosphatase: 96 IU/L (ref 42–106)
BUN/Creatinine Ratio: 16 (ref 9–23)
BUN: 13 mg/dL (ref 6–20)
Bilirubin Total: 0.3 mg/dL (ref 0.0–1.2)
CO2: 20 mmol/L (ref 20–29)
Calcium: 9.9 mg/dL (ref 8.7–10.2)
Chloride: 100 mmol/L (ref 96–106)
Creatinine, Ser: 0.79 mg/dL (ref 0.57–1.00)
Globulin, Total: 3.1 g/dL (ref 1.5–4.5)
Glucose: 82 mg/dL (ref 65–99)
Potassium: 4.8 mmol/L (ref 3.5–5.2)
Sodium: 138 mmol/L (ref 134–144)
Total Protein: 7.5 g/dL (ref 6.0–8.5)
eGFR: 110 mL/min/{1.73_m2} (ref >59)

## 2021-01-23 LAB — LIPID PANEL WITH LDL/HDL RATIO
Cholesterol, Total: 226 mg/dL — ABNORMAL HIGH (ref 100–199)
HDL: 56 mg/dL (ref >39)
LDL Chol Calc (NIH): 160 mg/dL — ABNORMAL HIGH (ref 0–99)
LDL/HDL Ratio: 2.9 ratio (ref 0.0–3.2)
Triglycerides: 56 mg/dL (ref 0–149)
VLDL Cholesterol Cal: 10 mg/dL (ref 5–40)

## 2021-01-23 NOTE — Progress Notes (Signed)
Labs reviewed, will discuss at next visit.

## 2021-01-27 ENCOUNTER — Encounter: Payer: Self-pay | Admitting: Hospice and Palliative Medicine

## 2021-01-28 ENCOUNTER — Telehealth: Payer: Self-pay

## 2021-01-28 NOTE — Telephone Encounter (Signed)
Spoke with pt she is feeling better just happened 1 times yesterday she is due  For her menstrual cycle any time advised her if happened again need appt and also no fever no other symptoms

## 2021-01-28 NOTE — Telephone Encounter (Signed)
Please call and get detail history?? COVID or something else

## 2021-01-31 ENCOUNTER — Emergency Department
Admission: EM | Admit: 2021-01-31 | Discharge: 2021-01-31 | Disposition: A | Discharge status: Home / Self Care | Payer: Medicaid Other | Attending: Emergency Medicine | Admitting: Emergency Medicine

## 2021-01-31 ENCOUNTER — Encounter: Payer: Self-pay | Admitting: Emergency Medicine

## 2021-01-31 ENCOUNTER — Other Ambulatory Visit: Payer: Self-pay

## 2021-01-31 ENCOUNTER — Emergency Department: Payer: Medicaid Other

## 2021-01-31 DIAGNOSIS — J45909 Unspecified asthma, uncomplicated: Secondary | ICD-10-CM | POA: Insufficient documentation

## 2021-01-31 DIAGNOSIS — R0789 Other chest pain: Secondary | ICD-10-CM | POA: Diagnosis not present

## 2021-01-31 MED ORDER — IPRATROPIUM-ALBUTEROL 0.5-2.5 (3) MG/3ML IN SOLN
3.0000 mL | Freq: Once | RESPIRATORY_TRACT | Status: AC
  Administered 2021-01-31: 3 mL via RESPIRATORY_TRACT
  Filled 2021-01-31: qty 3

## 2021-01-31 NOTE — ED Notes (Signed)
Pt has been provided with discharge instructions. Pt denies any questions or concerns at this time. Pt verbalizes understanding for follow up care and d/c.  VSS.  Pt left department with all belongings.  

## 2021-01-31 NOTE — ED Triage Notes (Signed)
Pt reports SOB for the last week, states that her breathing is getting worse.

## 2021-01-31 NOTE — ED Notes (Signed)
Patient transported to X-ray 

## 2021-01-31 NOTE — ED Provider Notes (Signed)
[facility] Emergency Department Provider Note   ____________________________________________    I have reviewed the triage vital signs and the nursing notes.   HISTORY  Chief Complaint Chest pain    HPI Sandy Burns is a 20 y.o. female with a history of asthma who presents with complaints of intermittent sharp chest discomfort which has been occurring over the last several days.  She also feels like her breathing may be tighter than usual.  Denies fevers chills, she does have a dry cough occasionally.  No calf pain or swelling.  No pleurisy.  Currently feels well and has no pain.  No sick contacts reported.  Not on birth control  Past Medical History:  Diagnosis Date  . Anemia   . Asthma   . Eczema   . Environmental allergies   . History of blood transfusion    after giving birth 09/06/2020    Patient Active Problem List   Diagnosis Date Noted  . Delay 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/07/2020  . 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 Symptomatic anemia 09/07/2020  . Maternal blood transfusion 09/07/2020  . Obesity in pregnancy 09/07/2020  . Mild pre-eclampsia in third trimester 09/05/2020  . Pregnancy 08/22/2020  . Asthma affecting pregnancy in third trimester 08/18/2020  . Indication for care in labor or delivery 08/14/2020  . Labor and delivery, indication for care 07/19/2020    Past Surgical History:  Procedure Laterality Date  . FOOT SURGERY  2015  . REPAIR VAGINAL CUFF N/A 09/06/2020   Procedure: REPAIR VAGINAL CUFF;  Surgeon: Hildred Laser, MD;  Location: [facility] ORS;  Service: Gynecology;  Laterality: N/A;  . TOOTH EXTRACTION  2015    Prior to Admission medications   Medication Sig Start Date End Date Taking? Authorizing Provider  ADVAIR DISKUS 100-50 MCG/DOSE AEPB Inhale 1 puff into the lungs 2 (two) times daily. 01/08/21   Theotis Burrow, NP  cetirizine (ZYRTEC) 5 MG tablet Take 5 mg by mouth daily.    [provider]   Clindamycin Phos-Benzoyl Perox gel Apply 1 application topically daily. 12/02/20   Deirdre Evener, MD  EPIPEN 2-PAK 0.3 MG/0.3ML SOAJ injection use as directed by prescriber if needed 04/30/15   [provider]  fluticasone Aleda Grana) 50 MCG/ACT nasal spray  03/02/15   [provider]  medroxyPROGESTERone (DEPO-PROVERA) 150 MG/ML injection Inject 1 mL (150 mg total) into the muscle every 3 (three) months. 10/10/20   Hildred Laser, MD  mometasone (ELOCON) 0.1 % cream Apply 1 application topically daily. Until clear to chest and neck for rash. Avoid applying to face, groin, and axilla. Use as directed. Risk of skin atrophy with long-term use reviewed. 11/23/20   Deirdre Evener, MD  PATADAY 0.2 % SOLN  03/15/15   [provider]  PROAIR HFA 108 [id number] Base) MCG/ACT inhaler inhale 1 to 2 puffs every 4 to 6 hours if needed for cough if needed 01/08/21   Theotis Burrow, NP  tretinoin (RETIN-A) 0.1 % cream Apply topically at bedtime. 11/23/20 11/23/21  Deirdre Evener, MD  NIFEdipine (ADALAT CC) 30 MG 24 hr tablet Take 1 tablet (30 mg total) by mouth daily. 09/07/20 01/08/21  Hildred Laser, MD     Allergies Other, Cats claw (uncaria tomentosa), Dust mite extract, and Molds & smuts  Family History  Problem Relation Age of Onset  . Rheum arthritis Mother   . Hypertension Mother   . Lupus Mother   . Schizophrenia Father  Social History Social History   Tobacco Use  . Smoking status: Never Smoker  . Smokeless tobacco: Never Used  Vaping Use  . Vaping Use: Never used  Substance Use Topics  . Alcohol use: Never    Alcohol/week: 0.0 standard drinks  . Drug use: Never    Review of Systems  Constitutional: No fever/chills Eyes: No visual changes.  ENT: No sore throat. Cardiovascular: As above Respiratory: As above Gastrointestinal: No abdominal pain.  No nausea, no vomiting.   Genitourinary: Negative for dysuria. Musculoskeletal: Negative for back pain. Skin:  Negative for rash. Neurological: Negative for headaches or weakness   ____________________________________________   PHYSICAL EXAM:  VITAL SIGNS: ED Triage Vitals  Enc Vitals Group     BP 01/31/21 2048 132/69     Pulse Rate 01/31/21 2048 (!) 107     Resp 01/31/21 2048 20     Temp 01/31/21 2048 98.9 F (37.2 C)     Temp Source 01/31/21 2048 Oral     SpO2 01/31/21 2048 97 %     Weight 01/31/21 2037 108.9 kg (240 lb)     Height 01/31/21 2037 1.702 m (5\' 7" )     Head Circumference --      Peak Flow --      Pain Score 01/31/21 2037 0     Pain Loc --      Pain Edu? --      Excl. in GC? --     Constitutional: Alert and oriented. No acute distress. Pleasant and interactive  Nose: No congestion/rhinnorhea. Mouth/Throat: Mucous membranes are moist.    Cardiovascular: Normal rate, regular rhythm. Grossly normal heart sounds.  Good peripheral circulation. Respiratory: Normal respiratory effort.  No retractions.  Scattered mild wheezing Gastrointestinal: Soft and nontender. No distention.  No CVA tenderness.  Musculoskeletal: No lower extremity tenderness nor edema.  Warm and well perfused Neurologic:  Normal speech and language. No gross focal neurologic deficits are appreciated.  Skin:  Skin is warm, dry and intact. No rash noted. Psychiatric: Mood and affect are normal. Speech and behavior are normal.  ____________________________________________   LABS (all labs ordered are listed, but only abnormal results are displayed)  Labs Reviewed - No data to display ____________________________________________  EKG  ED ECG REPORT I, 2038, the attending physician, personally viewed and interpreted this ECG.  Date: 01/31/2021  Rhythm: normal sinus rhythm QRS Axis: normal Intervals: normal ST/T Wave abnormalities: normal Narrative Interpretation: no evidence of acute ischemia  ____________________________________________  RADIOLOGY  Chest x-ray viewed by me, no  acute abnormality ____________________________________________   PROCEDURES  Procedure(s) performed: No  Procedures   Critical Care performed: No ____________________________________________   INITIAL IMPRESSION / ASSESSMENT AND PLAN / ED COURSE  Pertinent labs & imaging results that were available during my care of the patient were reviewed by me and considered in my medical decision making (see chart for details).  Patient well-appearing and in no acute distress.  EKG reassuring, not consistent with ACS or PE.  Doubt pneumonia.  Suspect mild asthma exacerbation as the cause given scattered wheezing.  Will treat with DuoNeb, obtain x-ray, EKG  EKG is reassuring, x-ray is without evidence of pneumonia.  Patient feeling much improved after DuoNeb.  Appropriate for discharge at this time with outpatient follow-up, strict return precautions discussed.    ____________________________________________   FINAL CLINICAL IMPRESSION(S) / ED DIAGNOSES  Final diagnoses:  Atypical chest pain        Note:  This document was prepared using  [employer] and may include unintentional dictation errors.   Jene Every, MD 01/31/21 2234

## 2021-01-31 NOTE — ED Notes (Signed)
Sig pad not working. Pt consents to receiving d/c instructions.

## 2021-02-04 DIAGNOSIS — J301 Allergic rhinitis due to pollen: Secondary | ICD-10-CM | POA: Diagnosis not present

## 2021-02-04 DIAGNOSIS — J3089 Other allergic rhinitis: Secondary | ICD-10-CM | POA: Diagnosis not present

## 2021-02-04 DIAGNOSIS — H1045 Other chronic allergic conjunctivitis: Secondary | ICD-10-CM | POA: Diagnosis not present

## 2021-02-04 DIAGNOSIS — J4530 Mild persistent asthma, uncomplicated: Secondary | ICD-10-CM | POA: Diagnosis not present

## 2021-03-10 ENCOUNTER — Ambulatory Visit: Payer: Medicaid Other | Admitting: Internal Medicine

## 2021-03-10 ENCOUNTER — Encounter: Payer: Self-pay | Admitting: Internal Medicine

## 2021-03-10 ENCOUNTER — Other Ambulatory Visit: Payer: Self-pay

## 2021-03-10 VITALS — BP 108/80 | HR 105 | Temp 98.3°F | Resp 16 | Ht 67.0 in | Wt 275.0 lb

## 2021-03-10 DIAGNOSIS — J4520 Mild intermittent asthma, uncomplicated: Secondary | ICD-10-CM

## 2021-03-10 DIAGNOSIS — Z0001 Encounter for general adult medical examination with abnormal findings: Secondary | ICD-10-CM

## 2021-03-10 DIAGNOSIS — J3089 Other allergic rhinitis: Secondary | ICD-10-CM | POA: Diagnosis not present

## 2021-03-10 DIAGNOSIS — R300 Dysuria: Secondary | ICD-10-CM | POA: Diagnosis not present

## 2021-03-10 DIAGNOSIS — Z6841 Body Mass Index (BMI) 40.0 and over, adult: Secondary | ICD-10-CM

## 2021-03-10 DIAGNOSIS — E782 Mixed hyperlipidemia: Secondary | ICD-10-CM

## 2021-03-10 NOTE — Progress Notes (Signed)
[facility] [place], [place] [address]  Internal MEDICINE  Office Visit Note  Patient Name: Sandy Burns  [id number]  [id number]  Date of Service: 03/16/2021  Chief Complaint  Patient presents with  . Annual Exam  . Nasal Congestion    Itchy pallet in mouth  . Quality Metric Gaps    Hep C screening, HPV vaccines     HPI Pt is here for routine health maintenance examination Patient feels well has 15-month-old baby boy she had mild preeclampsia during her pregnancy and the baby was delivered early. She also gives history of allergies and asthma does use Advair Ceretec and Flonase on a regular basis [employer] diploma  Lives with mom   Has been sexually active, has 40 month old baby boy, mild pre eclampsia   Concerned about her wt. we will further look into some weight loss programs Her laboratories show mild hyperlipidemia Current Medication: Outpatient Encounter Medications as of 03/10/2021  Medication Sig Note  . ADVAIR DISKUS 100-50 MCG/DOSE AEPB Inhale 1 puff into the lungs 2 (two) times daily.   . cetirizine (ZYRTEC) 5 MG tablet Take 5 mg by mouth daily.   . Clindamycin Phos-Benzoyl Perox gel Apply 1 application topically daily.   Marland Kitchen EPIPEN 2-PAK 0.3 MG/0.3ML SOAJ injection use as directed by prescriber if needed 05/20/2015: Received from: External Pharmacy  . fluticasone (FLONASE) 50 MCG/ACT nasal spray  03/30/2015: Received from: External Pharmacy  . medroxyPROGESTERone (DEPO-PROVERA) 150 MG/ML injection Inject 1 mL (150 mg total) into the muscle every 3 (three) months.   . mometasone (ELOCON) 0.1 % cream Apply 1 application topically daily. Until clear to chest and neck for rash. Avoid applying to face, groin, and axilla. Use as directed. Risk of skin atrophy with long-term use reviewed.   . nitrofurantoin, macrocrystal-monohydrate, (MACROBID) 100 MG capsule One cap po bid for UTI   . PATADAY 0.2 % SOLN  03/30/2015: Received from: External  Pharmacy  . PROAIR HFA 108 (90 Base) MCG/ACT inhaler inhale 1 to 2 puffs every 4 to 6 hours if needed for cough if needed   . tretinoin (RETIN-A) 0.1 % cream Apply topically at bedtime.   . [DISCONTINUED] NIFEdipine (ADALAT CC) 30 MG 24 hr tablet Take 1 tablet (30 mg total) by mouth daily.    No facility-administered encounter medications on file as of 03/10/2021.    Surgical History: Past Surgical History:  Procedure Laterality Date  . FOOT SURGERY  2015  . REPAIR VAGINAL CUFF N/A 09/06/2020   Procedure: REPAIR VAGINAL CUFF;  Surgeon: Rubie Maid, MD;  Location: [facility] ORS;  Service: Gynecology;  Laterality: N/A;  . TOOTH EXTRACTION  2015    Medical History: Past Medical History:  Diagnosis Date  . Anemia   . Asthma   . Eczema   . Environmental allergies   . History of blood transfusion    after giving birth 09/06/2020    Family History: Family History  Problem Relation Age of Onset  . Rheum arthritis Mother   . Hypertension Mother   . Lupus Mother   . Schizophrenia Father     Social History: Social History   Socioeconomic History  . Marital status: Single    Spouse name: Not on file  . Number of children: Not on file  . Years of education: Not on file  . Highest education level: Not on file  Occupational History  . Not on file  Tobacco Use  . Smoking status: Never Smoker  .  Smokeless tobacco: Never Used  Vaping Use  . Vaping Use: Never used  Substance and Sexual Activity  . Alcohol use: Never    Alcohol/week: 0.0 standard drinks  . Drug use: Never  . Sexual activity: Not Currently    Comment: undecided  Other Topics Concern  . Not on file  Social History Narrative  . Not on file   Social Determinants of Health   Financial Resource Strain: Not on file  Food Insecurity: Not on file  Transportation Needs: Not on file  Physical Activity: Not on file  Stress: Not on file  Social Connections: Not on file      Review of Systems  Constitutional:  Negative for chills, diaphoresis and fatigue.  HENT: Positive for congestion. Negative for ear pain, postnasal drip and sinus pressure.   Eyes: Negative for photophobia, discharge, redness, itching and visual disturbance.  Respiratory: Negative for cough, shortness of breath and wheezing.   Cardiovascular: Negative for chest pain, palpitations and leg swelling.  Gastrointestinal: Negative for abdominal pain, constipation, diarrhea, nausea and vomiting.  Genitourinary: Negative for dysuria and flank pain.  Musculoskeletal: Negative for arthralgias, back pain, gait problem and neck pain.  Skin: Negative for color change.  Allergic/Immunologic: Negative for environmental allergies and food allergies.  Neurological: Negative for dizziness and headaches.  Hematological: Does not bruise/bleed easily.  Psychiatric/Behavioral: Negative for agitation, behavioral problems (depression) and hallucinations.     Vital Signs: BP 108/80   Pulse (!) 105   Temp 98.3 F (36.8 C)   Resp 16   Ht $R'5\' 7"'LC$  (1.702 m)   Wt 275 lb (124.7 kg)   SpO2 98%   BMI 43.07 kg/m    Physical Exam Constitutional:      General: She is not in acute distress.    Appearance: She is well-developed. She is not diaphoretic.  HENT:     Head: Normocephalic and atraumatic.     Mouth/Throat:     Pharynx: No oropharyngeal exudate.  Eyes:     Pupils: Pupils are equal, round, and reactive to light.  Neck:     Thyroid: No thyromegaly.     Vascular: No JVD.     Trachea: No tracheal deviation.  Cardiovascular:     Rate and Rhythm: Normal rate and regular rhythm.     Heart sounds: Normal heart sounds. No murmur [facility]) heard. No friction rub. No gallop.   Pulmonary:     Effort: Pulmonary effort is normal. No respiratory distress.     Breath sounds: No wheezing or rales.  Chest:     Chest wall: No tenderness.  Abdominal:     General: Bowel sounds are normal.     Palpations: Abdomen is soft.  Musculoskeletal:         General: Normal range of motion.     Cervical back: Normal range of motion and neck supple.  Lymphadenopathy:     Cervical: No cervical adenopathy.  Skin:    General: Skin is warm and dry.  Neurological:     Mental Status: She is alert and oriented to person, place, and time.     Cranial Nerves: No cranial nerve deficit.  Psychiatric:        Behavior: Behavior normal.        Thought Content: Thought content normal.        Judgment: Judgment normal.      LABS: Recent Results (from the past 2160 hour(s))  CBC w/Diff/Platelet     Status: None   Collection  Time: 01/22/21 10:37 AM  Result Value Ref Range   WBC 7.6 3.4 - 10.8 x10E3/uL   RBC 4.51 3.77 - 5.28 x10E6/uL   Hemoglobin 12.7 11.1 - 15.9 g/dL   Hematocrit 38.1 34.0 - 46.6 %   MCV 85 79 - 97 fL   MCH 28.2 26.6 - 33.0 pg   MCHC 33.3 31.5 - 35.7 g/dL   RDW 13.7 11.7 - 15.4 %   Platelets 340 150 - 450 x10E3/uL   Neutrophils 55 Not Estab. %   Lymphs 32 Not Estab. %   Monocytes 9 Not Estab. %   Eos 3 Not Estab. %   Basos 1 Not Estab. %   Neutrophils Absolute 4.2 1.4 - 7.0 x10E3/uL   Lymphocytes Absolute 2.4 0.7 - 3.1 x10E3/uL   Monocytes Absolute 0.6 0.1 - 0.9 x10E3/uL   EOS (ABSOLUTE) 0.2 0.0 - 0.4 x10E3/uL   Basophils Absolute 0.1 0.0 - 0.2 x10E3/uL   Immature Granulocytes 0 Not Estab. %   Immature Grans (Abs) 0.0 0.0 - 0.1 x10E3/uL  Comprehensive Metabolic Panel (CMET)     Status: None   Collection Time: 01/22/21 10:37 AM  Result Value Ref Range   Glucose 82 65 - 99 mg/dL   BUN 13 6 - 20 mg/dL   Creatinine, Ser 0.79 0.57 - 1.00 mg/dL   eGFR 110 >59 mL/min/1.73   BUN/Creatinine Ratio 16 9 - 23   Sodium 138 134 - 144 mmol/L   Potassium 4.8 3.5 - 5.2 mmol/L   Chloride 100 96 - 106 mmol/L   CO2 20 20 - 29 mmol/L   Calcium 9.9 8.7 - 10.2 mg/dL   Total Protein 7.5 6.0 - 8.5 g/dL   Albumin 4.4 3.9 - 5.0 g/dL   Globulin, Total 3.1 1.5 - 4.5 g/dL   Albumin/Globulin Ratio 1.4 1.2 - 2.2   Bilirubin Total 0.3 0.0 - 1.2  mg/dL   Alkaline Phosphatase 96 42 - 106 IU/L   AST 15 0 - 40 IU/L   ALT 19 0 - 32 IU/L  Lipid Panel With LDL/HDL Ratio     Status: Abnormal   Collection Time: 01/22/21 10:37 AM  Result Value Ref Range   Cholesterol, Total 226 (H) 100 - 199 mg/dL   Triglycerides 56 0 - 149 mg/dL   HDL 56 >39 mg/dL   VLDL Cholesterol Cal 10 5 - 40 mg/dL   LDL Chol Calc (NIH) 160 (H) 0 - 99 mg/dL   LDL/HDL Ratio 2.9 0.0 - 3.2 ratio    Comment:                                     LDL/HDL Ratio                                             Men  Women                               1/2 Avg.Risk  1.0    1.5                                   Avg.Risk  3.6    3.2  2X Avg.Risk  6.2    5.0                                3X Avg.Risk  8.0    6.1   TSH + free T4     Status: None   Collection Time: 01/22/21 10:37 AM  Result Value Ref Range   TSH 1.900 0.450 - 4.500 uIU/mL   Free T4 1.11 0.82 - 1.77 ng/dL  UA/M w/rflx Culture, Routine     Status: Abnormal   Collection Time: 03/10/21  9:20 AM   Specimen: Urine   Urine  Result Value Ref Range   Specific Gravity, UA 1.023 1.005 - 1.030   pH, UA 5.5 5.0 - 7.5   Color, UA Yellow Yellow   Appearance Ur Clear Clear   Leukocytes,UA Trace (A) Negative   Protein,UA Trace Negative/Trace   Glucose, UA Negative Negative   Ketones, UA Negative Negative   RBC, UA Negative Negative   Bilirubin, UA Negative Negative   Urobilinogen, Ur 0.2 0.2 - 1.0 mg/dL   Nitrite, UA Negative Negative   Microscopic Examination See below:     Comment: Microscopic was indicated and was performed.   Urinalysis Reflex Comment     Comment: This specimen has reflexed to a Urine Culture.  Microscopic Examination     Status: Abnormal   Collection Time: 03/10/21  9:20 AM   Urine  Result Value Ref Range   WBC, UA 6-10 (A) 0 - 5 /hpf   RBC None seen 0 - 2 /hpf   Epithelial Cells (non renal) >10 (A) 0 - 10 /hpf   Casts None seen None seen /lpf   Bacteria, UA Few  None seen/Few  Urine Culture, Reflex     Status: Abnormal   Collection Time: 03/10/21  9:20 AM   Urine  Result Value Ref Range   Urine Culture, Routine Final report (A)    Organism ID, Bacteria Escherichia coli (A)     Comment: Greater than 100,000 colony forming units per mL Cefazolin <=4 ug/mL Cefazolin with an MIC <=16 predicts susceptibility to the oral agents cefaclor, cefdinir, cefpodoxime, cefprozil, cefuroxime, cephalexin, and loracarbef when used for therapy of uncomplicated urinary tract infections due to E. coli, Klebsiella pneumoniae, and Proteus mirabilis.    Antimicrobial Susceptibility Comment     Comment:       ** S = Susceptible; I = Intermediate; R = Resistant **                    P = Positive; N = Negative             MICS are expressed in micrograms per mL    Antibiotic                 RSLT#1    RSLT#2    RSLT#3    RSLT#4 Amoxicillin/Clavulanic Acid    S Ampicillin                     I Cefepime                       S Ceftriaxone                    S Cefuroxime  S Ciprofloxacin                  S Ertapenem                      S Gentamicin                     S Imipenem                       S Levofloxacin                   S Meropenem                      S Nitrofurantoin                 S Piperacillin/Tazobactam        S Tetracycline                   S Tobramycin                     S Trimethoprim/Sulfa             S       Assessment/Plan: 1. Encounter for general adult medical examination with abnormal findings Age-appropriate health maintenance is updated patient follows up for which she went for her contraceptive methods and Pap smears are done by them as well  2. Non-seasonal allergic rhinitis due to other allergic trigger Patient is to continue Zyrtec and Flonase.  She might need Singulair or even allergy testing in the future  3. Mild intermittent asthma without complication Continue Advair albuterol as before she has  stable asthma with no recent flareups.  4. 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 Dysuria Renal function due to E. coli.  Will treat empirically daily twice a day - UA/M w/rflx Culture, Routine - Microscopic Examination - Urine Culture, Reflex - nitrofurantoin, macrocrystal-monohydrate, (MACROBID) 100 MG capsule; One cap po bid for UTI  Dispense: 14 capsule; Refill: 0  6. Mixed hyperlipidemia Lifestyle changes including dietary modifications and exercise with restriction of calories continue low-cholesterol low-fat diet.  General Counseling: Scharlene verbalizes understanding of the findings of todays visit and agrees with plan of treatment. I have discussed any further diagnostic evaluation that may be needed or ordered today. We also reviewed her medications today. she has been encouraged to call the office with any questions or concerns that should arise related to todays visit.    Counseling:  [place] Controlled Substance Database was reviewed by me.  Orders Placed This Encounter  Procedures  . Microscopic Examination  . Urine Culture, Reflex  . UA/M w/rflx Culture, Routine    Meds ordered this encounter  Medications  . nitrofurantoin, macrocrystal-monohydrate, (MACROBID) 100 MG capsule    Sig: One cap po bid for UTI    Dispense:  14 capsule    Refill:  0    Total time spent:40 Minutes  Time spent includes review of chart, medications, test results, and follow up plan with the patient.     Lavera Guise, MD  Internal Medicine

## 2021-03-14 LAB — UA/M W/RFLX CULTURE, ROUTINE
Bilirubin, UA: NEGATIVE
Glucose, UA: NEGATIVE
Ketones, UA: NEGATIVE
Nitrite, UA: NEGATIVE
RBC, UA: NEGATIVE
Specific Gravity, UA: 1.023 (ref 1.005–1.030)
Urobilinogen, Ur: 0.2 mg/dL (ref 0.2–1.0)
pH, UA: 5.5 (ref 5.0–7.5)

## 2021-03-14 LAB — MICROSCOPIC EXAMINATION
Casts: NONE SEEN /lpf
Epithelial Cells (non renal): >10 /hpf — AB (ref 0–10)
RBC, Urine: NONE SEEN /hpf (ref 0–2)

## 2021-03-14 LAB — URINE CULTURE, REFLEX

## 2021-03-16 MED ORDER — NITROFURANTOIN MONOHYD MACRO 100 MG PO CAPS: ORAL_CAPSULE | ORAL | 0 refills | Status: DC

## 2021-03-16 NOTE — Progress Notes (Signed)
Please advise pt she has UTI and will like her to take antibiotics. RX for macrobid is sent

## 2021-03-22 ENCOUNTER — Encounter: Payer: Self-pay | Admitting: Nurse Practitioner

## 2021-03-22 ENCOUNTER — Telehealth: Payer: Self-pay

## 2021-03-22 NOTE — Telephone Encounter (Signed)
Called pt to give her the results from her urine cultural  , pt did not answered. LNB

## 2021-03-23 ENCOUNTER — Telehealth: Payer: Self-pay

## 2021-03-23 NOTE — Telephone Encounter (Signed)
Spoke to pt and informed her of her lab results and that we sent antibiotics to her pharmacy for a UTI.  Informed pt to call us back if any other issues

## 2021-03-23 NOTE — Telephone Encounter (Signed)
Done

## 2021-04-01 ENCOUNTER — Ambulatory Visit: Payer: Medicaid Other

## 2021-04-02 ENCOUNTER — Ambulatory Visit (INDEPENDENT_AMBULATORY_CARE_PROVIDER_SITE_OTHER): Payer: Medicaid Other

## 2021-04-02 ENCOUNTER — Other Ambulatory Visit: Payer: Self-pay

## 2021-04-02 VITALS — Ht 67.0 in

## 2021-04-02 DIAGNOSIS — Z3042 Encounter for surveillance of injectable contraceptive: Secondary | ICD-10-CM

## 2021-04-02 MED ORDER — MEDROXYPROGESTERONE ACETATE 150 MG/ML IM SUSP
150.0000 mg | Freq: Once | INTRAMUSCULAR | Status: AC
  Administered 2021-04-02: 150 mg via INTRAMUSCULAR

## 2021-04-02 NOTE — Progress Notes (Signed)
Date last pap: n/a underage. Last Depo-Provera: 01/08/2021. Side Effects if any: none. Serum HCG indicated? N/A. Depo-Provera 150 mg IM given by: Billy Fischer, LPN. Next appointment due Sept 9-23, 2022.  Patient supplied depo injection.

## 2021-05-10 ENCOUNTER — Other Ambulatory Visit: Payer: Self-pay

## 2021-05-10 DIAGNOSIS — J45909 Unspecified asthma, uncomplicated: Secondary | ICD-10-CM

## 2021-05-10 MED ORDER — PROAIR HFA 108 (90 BASE) MCG/ACT IN AERS: INHALATION_SPRAY | RESPIRATORY_TRACT | 2 refills | Status: DC

## 2021-05-24 ENCOUNTER — Ambulatory Visit: Payer: Medicaid Other | Admitting: Dermatology

## 2021-05-25 ENCOUNTER — Ambulatory Visit (INDEPENDENT_AMBULATORY_CARE_PROVIDER_SITE_OTHER): Payer: Medicaid Other | Admitting: Obstetrics and Gynecology

## 2021-05-25 ENCOUNTER — Other Ambulatory Visit: Payer: Self-pay

## 2021-05-25 ENCOUNTER — Encounter: Payer: Self-pay | Admitting: Obstetrics and Gynecology

## 2021-05-25 VITALS — BP 127/86 | HR 103 | Ht 67.0 in | Wt 280.3 lb

## 2021-05-25 DIAGNOSIS — Z3009 Encounter for other general counseling and advice on contraception: Secondary | ICD-10-CM

## 2021-05-25 NOTE — Progress Notes (Signed)
HPI:      [name] is a 20 y.o. G1P1001 who LMP was No LMP recorded (lmp unknown). Patient has had an injection.  Subjective:   She presents today to discuss birth control options.  She has been using Depo-Provera for 2 injections and does not like the weight gain she has experienced.  She was considering Nexplanon but once the bleeding profile was discussed she was not interested.  Her Depo is not due until next month.    Hx: The following portions of the patient's history were reviewed and updated as appropriate:             She  has a past medical history of Anemia, Asthma, Eczema, Environmental allergies, and History of blood transfusion. She does not have any pertinent problems on file. She  has a past surgical history that includes Foot surgery (2015); Tooth extraction (2015); and Repair vaginal cuff (N/A, 09/06/2020). Her family history includes Hypertension in her mother; Lupus in her mother; Rheum arthritis in her mother; Schizophrenia in her father. She  reports that she has never smoked. She has never used smokeless tobacco. She reports that she does not drink alcohol and does not use drugs. She has a current medication list which includes the following prescription(s): advair diskus, cetirizine, clindamycin phos-benzoyl perox, epipen 2-pak, fluticasone, medroxyprogesterone, mometasone, montelukast, proair hfa, tretinoin, triamcinolone cream, and [DISCONTINUED] nifedipine. She is allergic to other, cats claw (uncaria tomentosa), dust mite extract, and molds & smuts.       Review of Systems:  Review of Systems  Constitutional: Denied constitutional symptoms, night sweats, recent illness, fatigue, fever, insomnia and weight loss.  Eyes: Denied eye symptoms, eye pain, photophobia, vision change and visual disturbance.  Ears/Nose/Throat/Neck: Denied ear, nose, throat or neck symptoms, hearing loss, nasal discharge, sinus congestion and sore throat.  Cardiovascular: Denied  cardiovascular symptoms, arrhythmia, chest pain/pressure, edema, exercise intolerance, orthopnea and palpitations.  Respiratory: Denied pulmonary symptoms, asthma, pleuritic pain, productive sputum, cough, dyspnea and wheezing.  Gastrointestinal: Denied, gastro-esophageal reflux, melena, nausea and vomiting.  Genitourinary: Denied genitourinary symptoms including symptomatic vaginal discharge, pelvic relaxation issues, and urinary complaints.  Musculoskeletal: Denied musculoskeletal symptoms, stiffness, swelling, muscle weakness and myalgia.  Dermatologic: Denied dermatology symptoms, rash and scar.  Neurologic: Denied neurology symptoms, dizziness, headache, neck pain and syncope.  Psychiatric: Denied psychiatric symptoms, anxiety and depression.  Endocrine: Denied endocrine symptoms including hot flashes and night sweats.   Meds:   Current Outpatient Medications on File Prior to Visit  Medication Sig Dispense Refill   ADVAIR DISKUS 100-50 MCG/DOSE AEPB Inhale 1 puff into the lungs 2 (two) times daily. 60 each 2   cetirizine (ZYRTEC) 5 MG tablet Take 5 mg by mouth daily.     Clindamycin Phos-Benzoyl Perox gel Apply 1 application topically daily. 50 g 4   EPIPEN 2-PAK 0.3 MG/0.3ML SOAJ injection use as directed by prescriber if needed  0   fluticasone (FLONASE) 50 MCG/ACT nasal spray   0   medroxyPROGESTERone (DEPO-PROVERA) 150 MG/ML injection Inject 1 mL (150 mg total) into the muscle every 3 (three) months. 1 mL 3   mometasone (ELOCON) 0.1 % cream Apply 1 application topically daily. Until clear to chest and neck for rash. Avoid applying to face, groin, and axilla. Use as directed. Risk of skin atrophy with long-term use reviewed. 45 g 2   montelukast (SINGULAIR) 10 MG tablet 1 tablet     PROAIR HFA 108 (90 Base) MCG/ACT inhaler inhale 1 to 2 puffs  every 4 to 6 hours if needed for cough if needed 18 g 2   tretinoin (RETIN-A) 0.1 % cream Apply topically at bedtime. 45 g 5   triamcinolone  cream (KENALOG) 0.1 % 1 application to affected area     [DISCONTINUED] NIFEdipine (ADALAT CC) 30 MG 24 hr tablet Take 1 tablet (30 mg total) by mouth daily. 60 tablet 0   No current facility-administered medications on file prior to visit.      Objective:     Vitals:   05/25/21 0832  BP: 127/86  Pulse: (!) 103   Filed Weights   05/25/21 0832  Weight: 280 lb 4.8 oz (127.1 kg)                Assessment:    G1P1001 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   Vaginal vault hematoma 09/07/2020   Symptomatic anemia 09/07/2020   Maternal blood transfusion 09/07/2020   Obesity in pregnancy 09/07/2020   Mild pre-eclampsia in third trimester 09/05/2020   Pregnancy 08/22/2020   Asthma affecting pregnancy in third trimester 08/18/2020   Indication for care in labor or delivery 08/14/2020   Labor and delivery, indication for care 07/19/2020     1. Birth control counseling        Plan:            1.  Birth Control I discussed multiple birth control options and methods with the patient.  The risks and benefits of each were reviewed. Because OCPs are somewhat contraindicated because of her increased BMI we have specifically discussed IUD in some detail.  All of her questions were answered. She would like to take some time to decide upon her future method of birth control.  She will let [place] know when she has decided. Orders No orders of the defined types were placed in this encounter.   No orders of the defined types were placed in this encounter.     F/U  Return in about 4 weeks (around 06/22/2021). I spent 22 minutes involved in the care of this patient preparing to see the patient by obtaining and reviewing her medical history (including labs, imaging tests and prior procedures), documenting clinical information in the electronic health record (EHR), counseling and coordinating care plans, writing and sending prescriptions,  ordering tests or procedures and in direct communicating with the patient and medical staff discussing pertinent items from her history and physical exam.  Elonda Husky, M.D. 05/25/2021 9:03 AM

## 2021-06-18 ENCOUNTER — Ambulatory Visit: Payer: Medicaid Other

## 2021-06-19 ENCOUNTER — Other Ambulatory Visit: Payer: Self-pay | Admitting: Dermatology

## 2021-06-19 DIAGNOSIS — L700 Acne vulgaris: Secondary | ICD-10-CM

## 2021-06-20 ENCOUNTER — Emergency Department: Payer: Medicaid Other

## 2021-06-20 ENCOUNTER — Other Ambulatory Visit: Payer: Self-pay

## 2021-06-20 ENCOUNTER — Emergency Department
Admission: EM | Admit: 2021-06-20 | Discharge: 2021-06-20 | Disposition: A | Discharge status: Home / Self Care | Payer: Medicaid Other | Attending: Emergency Medicine | Admitting: Emergency Medicine

## 2021-06-20 DIAGNOSIS — R000 Tachycardia, unspecified: Secondary | ICD-10-CM | POA: Diagnosis not present

## 2021-06-20 DIAGNOSIS — J40 Bronchitis, not specified as acute or chronic: Secondary | ICD-10-CM | POA: Diagnosis not present

## 2021-06-20 DIAGNOSIS — R079 Chest pain, unspecified: Secondary | ICD-10-CM | POA: Diagnosis not present

## 2021-06-20 DIAGNOSIS — Z79899 Other long term (current) drug therapy: Secondary | ICD-10-CM | POA: Diagnosis not present

## 2021-06-20 DIAGNOSIS — R0789 Other chest pain: Secondary | ICD-10-CM | POA: Diagnosis not present

## 2021-06-20 DIAGNOSIS — J45909 Unspecified asthma, uncomplicated: Secondary | ICD-10-CM | POA: Diagnosis not present

## 2021-06-20 DIAGNOSIS — J209 Acute bronchitis, unspecified: Secondary | ICD-10-CM | POA: Insufficient documentation

## 2021-06-20 DIAGNOSIS — Z20822 Contact with and (suspected) exposure to covid-19: Secondary | ICD-10-CM | POA: Diagnosis not present

## 2021-06-20 DIAGNOSIS — R059 Cough, unspecified: Secondary | ICD-10-CM | POA: Diagnosis not present

## 2021-06-20 LAB — CBC
HCT: 37.3 % (ref 36.0–46.0)
Hemoglobin: 12.5 g/dL (ref 12.0–15.0)
MCH: 30.2 pg (ref 26.0–34.0)
MCHC: 33.5 g/dL (ref 30.0–36.0)
MCV: 90.1 fL (ref 80.0–100.0)
Platelets: 298 10*3/uL (ref 150–400)
RBC: 4.14 MIL/uL (ref 3.87–5.11)
RDW: 12.5 % (ref 11.5–15.5)
WBC: 7.5 10*3/uL (ref 4.0–10.5)
nRBC: 0 % (ref 0.0–0.2)

## 2021-06-20 LAB — BASIC METABOLIC PANEL
Anion gap: 6 (ref 5–15)
BUN: 13 mg/dL (ref 6–20)
CO2: 25 mmol/L (ref 22–32)
Calcium: 9.1 mg/dL (ref 8.9–10.3)
Chloride: 105 mmol/L (ref 98–111)
Creatinine, Ser: 0.7 mg/dL (ref 0.44–1.00)
GFR, Estimated: >60 mL/min (ref >60)
Glucose, Bld: 104 mg/dL — ABNORMAL HIGH (ref 70–99)
Potassium: 3.9 mmol/L (ref 3.5–5.1)
Sodium: 136 mmol/L (ref 135–145)

## 2021-06-20 LAB — RESP PANEL BY RT-PCR (FLU A&B, COVID) ARPGX2
Influenza A by PCR: NEGATIVE
Influenza B by PCR: NEGATIVE
SARS Coronavirus 2 by RT PCR: NEGATIVE

## 2021-06-20 LAB — HCG, QUANTITATIVE, PREGNANCY: hCG, Beta Chain, Quant, S: 1 m[IU]/mL

## 2021-06-20 LAB — POC URINE PREG, ED: Preg Test, Ur: NEGATIVE

## 2021-06-20 LAB — D-DIMER, QUANTITATIVE: D-Dimer, Quant: 0.87 ug{FEU}/mL — ABNORMAL HIGH (ref 0.00–0.50)

## 2021-06-20 LAB — TROPONIN I (HIGH SENSITIVITY): Troponin I (High Sensitivity): 5 ng/L (ref <18)

## 2021-06-20 MED ORDER — IOHEXOL 350 MG/ML SOLN
75.0000 mL | Freq: Once | INTRAVENOUS | Status: AC | PRN
  Administered 2021-06-20: 75 mL via INTRAVENOUS
  Filled 2021-06-20: qty 75

## 2021-06-20 MED ORDER — BENZONATATE 100 MG PO CAPS: 100.0000 mg | ORAL_CAPSULE | Freq: Three times a day (TID) | ORAL | 0 refills | Status: AC | PRN

## 2021-06-20 NOTE — ED Triage Notes (Addendum)
Nonproductive cough, intermittent shob, headaches, L sharp cp x1wk, denies fevers. Hx asthma, uses inhalers

## 2021-06-20 NOTE — ED Provider Notes (Signed)
[facility] Emergency Department Provider Note  ____________________________________________   Event Date/Time   First MD Initiated Contact with Patient 06/20/21 2053     (approximate)  I have reviewed the triage vital signs and the nursing notes.   HISTORY  Chief Complaint Chest Pain   HPI Sandy Burns is a 20 y.o. female with a past medical history of anemia, asthma, and eczema who presents for assessment of approximately 3 to 4 days of some left-sided sharp chest pain associate with shortness of breath and nonproductive cough.  She denies any fevers, headache, earache, sore throat does endorse mild frontal headache earlier this morning that has since resolved.  She has no new abdominal pain, back pain, nausea, vomiting, diarrhea, dysuria, rash or recent injuries or falls.  No recent illicit drug use, tobacco abuse or EtOH use.  No other acute concerns at this time.         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   Symptomatic anemia 09/07/2020   Maternal blood transfusion 09/07/2020   Obesity in pregnancy 09/07/2020   Mild pre-eclampsia in third trimester 09/05/2020   Pregnancy 08/22/2020   Asthma affecting pregnancy in third trimester 08/18/2020   Indication for care in labor or delivery 08/14/2020   Labor and delivery, indication for care 07/19/2020    Past Surgical History:  Procedure Laterality Date   FOOT SURGERY  2015   REPAIR VAGINAL CUFF N/A 09/06/2020   Procedure: REPAIR VAGINAL CUFF;  Surgeon: Hildred Laserherry, Anika, MD;  Location: [facility] ORS;  Service: Gynecology;  Laterality: N/A;   TOOTH EXTRACTION  2015    Prior to Admission medications   Medication Sig Start Date End Date Taking?  Authorizing Provider  benzonatate (TESSALON PERLES) 100 MG capsule Take 1 capsule (100 mg total) by mouth 3 (three) times daily as needed for up to 5 days for cough. 06/20/21 06/25/21 Yes Gilles ChiquitoSmith, Dorsie Burich P, MD  ADVAIR DISKUS 100-50 MCG/DOSE AEPB Inhale 1 puff into the lungs 2 (two) times daily. 01/08/21   Theotis BurrowHarris, Taylor S, NP  cetirizine (ZYRTEC) 5 MG tablet Take 5 mg by mouth daily.    [provider]  EPIPEN 2-PAK 0.3 MG/0.3ML SOAJ injection use as directed by prescriber if needed 04/30/15   [provider]  fluticasone Aleda Grana(FLONASE) 50 MCG/ACT nasal spray  03/02/15   [provider]  medroxyPROGESTERone (DEPO-PROVERA) 150 MG/ML injection Inject 1 mL (150 mg total) into the muscle every 3 (three) months. 10/10/20   Hildred Laserherry, Anika, MD  mometasone (ELOCON) 0.1 % cream Apply 1 application topically daily. Until clear to chest and neck for rash. Avoid applying to face, groin, and axilla. Use as directed. Risk of skin atrophy with long-term use reviewed. 11/23/20   Deirdre EvenerKowalski, David C, MD  montelukast (SINGULAIR) 10 MG tablet 1 tablet 02/04/21   [provider]  PROAIR HFA 108 [phone](90 Base) MCG/ACT inhaler inhale 1 to 2 puffs every 4 to 6 hours if needed for cough if needed 05/10/21   Lyndon CodeKhan, Fozia M, MD  tretinoin (RETIN-A) 0.1 % cream Apply topically at bedtime. 11/23/20 11/23/21  Deirdre EvenerKowalski, David C, MD  triamcinolone cream (KENALOG) 0.1 % 1 application to affected area  [provider]  NIFEdipine (ADALAT CC) 30 MG 24 hr tablet Take 1 tablet (30 mg total) by mouth daily. 09/07/20 01/08/21  Hildred Laser, MD    Allergies Other, Cats claw (uncaria tomentosa), Dust mite extract, and Molds & smuts  Family History  Problem Relation Age of Onset   Rheum arthritis Mother    Hypertension Mother    Lupus Mother    Schizophrenia Father     Social History Social History   Tobacco Use   Smoking status: Never   Smokeless tobacco: Never  Vaping Use   Vaping Use: Never used   Substance Use Topics   Alcohol use: Never    Alcohol/week: 0.0 standard drinks   Drug use: Never    Review of Systems  Review of Systems  Constitutional:  Negative for chills and fever.  HENT:  Negative for sore throat.   Eyes:  Negative for pain.  Respiratory:  Positive for cough and shortness of breath. Negative for stridor.   Cardiovascular:  Positive for chest pain.  Gastrointestinal:  Negative for vomiting.  Genitourinary:  Negative for dysuria.  Musculoskeletal:  Negative for myalgias.  Skin:  Negative for rash.  Neurological:  Positive for headaches. Negative for seizures and loss of consciousness.  Psychiatric/Behavioral:  Negative for suicidal ideas.   All other systems reviewed and are negative.    ____________________________________________   PHYSICAL EXAM:  VITAL SIGNS: ED Triage Vitals  Enc Vitals Group     BP 06/20/21 2018 130/74     Pulse Rate 06/20/21 2018 (!) 118     Resp 06/20/21 2018 16     Temp 06/20/21 2018 (!) 97.5 F (36.4 C)     Temp Source 06/20/21 2018 Oral     SpO2 06/20/21 2018 100 %     Weight 06/20/21 2018 280 lb 3.3 oz (127.1 kg)     Height 06/20/21 2018 5\' 7"  (1.702 m)     Head Circumference --      Peak Flow --      Pain Score 06/20/21 2017 5     Pain Loc --      Pain Edu? --      Excl. in GC? --    Vitals:   06/20/21 2018 06/20/21 2143  BP: 130/74   Pulse: (!) 118 (!) 101  Resp: 16   Temp: (!) 97.5 F (36.4 C)   SpO2: 100% 100%   Physical Exam Vitals and nursing note reviewed.  Constitutional:      General: She is not in acute distress.    Appearance: She is well-developed. She is obese.  HENT:     Head: Normocephalic and atraumatic.     Right Ear: External ear normal.     Left Ear: External ear normal.     Nose: Nose normal.     Mouth/Throat:     Mouth: Mucous membranes are moist.  Eyes:     Conjunctiva/sclera: Conjunctivae normal.  Cardiovascular:     Rate and Rhythm: Regular rhythm. Tachycardia present.      Heart sounds: No murmur heard. Pulmonary:     Effort: Pulmonary effort is normal. No respiratory distress.     Breath sounds: Normal breath sounds.  Abdominal:     Palpations: Abdomen is soft.     Tenderness: There is no abdominal tenderness.  Musculoskeletal:     Cervical back: Neck supple.  Skin:    General: Skin is warm and dry.     Capillary Refill: Capillary refill takes  less than 2 seconds.  Neurological:     Mental Status: She is alert and oriented to person, place, and time.  Psychiatric:        Mood and Affect: Mood normal.     ____________________________________________   LABS (all labs ordered are listed, but only abnormal results are displayed)  Labs Reviewed  BASIC METABOLIC PANEL - Abnormal; Notable for the following components:      Result Value   Glucose, Bld 104 (*)    All other components within normal limits  D-DIMER, QUANTITATIVE - Abnormal; Notable for the following components:   D-Dimer, Quant 0.87 (*)    All other components within normal limits  RESP PANEL BY RT-PCR (FLU A&B, COVID) ARPGX2  CBC  HCG, QUANTITATIVE, PREGNANCY  POC URINE PREG, ED  TROPONIN I (HIGH SENSITIVITY)  TROPONIN I (HIGH SENSITIVITY)   ____________________________________________  EKG  Sinus tachycardia with ventricular to 111, nonspecific ST changes in lead III and some artifact in lead I without other clear evidence of acute ischemia or significant arrhythmia. ____________________________________________  RADIOLOGY  ED MD interpretation: Chest x-ray has no focal consolidation, effusion, edema, pneumothorax or other acute thoracic process.   CTA chest shows no evidence of PE, bacterial pneumonia findings, effusion, edema or other acute thoracic process.  Official radiology report(s): DG Chest 2 View  Result Date: 06/20/2021 CLINICAL DATA:  Chest pain cough. EXAM: CHEST - 2 VIEW COMPARISON:  Chest radiograph dated 01/31/2021. FINDINGS: The heart size and mediastinal  contours are within normal limits. Both lungs are clear. The visualized skeletal structures are unremarkable. IMPRESSION: No active cardiopulmonary disease. Electronically Signed   By: Elgie Collard M.D.   On: 06/20/2021 20:35   CT Angio Chest PE W and/or Wo Contrast  Result Date: 06/20/2021 CLINICAL DATA:  Nonproductive cough, intermittent shortness of breath, left-sided chest pain for 1 week EXAM: CT ANGIOGRAPHY CHEST WITH CONTRAST TECHNIQUE: Multidetector CT imaging of the chest was performed using the standard protocol during bolus administration of intravenous contrast. Multiplanar CT image reconstructions and MIPs were obtained to evaluate the vascular anatomy. CONTRAST:  62mL OMNIPAQUE IOHEXOL 350 MG/ML SOLN COMPARISON:  06/20/2021 FINDINGS: Cardiovascular: This is a technically adequate evaluation of the pulmonary vasculature. No filling defects or pulmonary emboli. The heart is unremarkable without pericardial effusion. No evidence of thoracic aortic aneurysm or dissection. Mediastinum/Nodes: No enlarged mediastinal, hilar, or axillary lymph nodes. Thyroid gland, trachea, and esophagus demonstrate no significant findings. Lungs/Pleura: No airspace disease, effusion, or pneumothorax. Central airways are widely patent. Upper Abdomen: No acute abnormality. Musculoskeletal: No acute or destructive bony lesions. Reconstructed images demonstrate no additional findings. Review of the MIP images confirms the above findings. IMPRESSION: 1. No evidence of pulmonary embolus. 2. No acute intrathoracic process. Electronically Signed   By: Sharlet Salina M.D.   On: 06/20/2021 22:34    ____________________________________________   PROCEDURES  Procedure(s) performed (including Critical Care):  Procedures   ____________________________________________   INITIAL IMPRESSION / ASSESSMENT AND PLAN / ED COURSE      Patient presents with above-stated history and exam for assessment of a couple days of  some left-sided chest discomfort associate with shortness of breath and nonproductive cough.  On arrival she is tachycardic at 118 with otherwise stable vital signs on room air.    Differential includes PE, bacterial pneumonia, viral bronchitis, pleurisy, myocarditis, ACS, anemia, metabolic derangements, heart failure, spontaneous pneumothorax and possible GERD.  Chest x-ray has no evidence of pneumothorax, pulmonary edema, for consolidation suggestive of pneumonia or  other clear acute thoracic process.  ECG has some nonspecific findings but given nonelevated troponin obtained greater than 3 hours after symptom onset I have a low suspicion for ACS or myocarditis.  CBC has no leukocytosis or acute anemia.  BMP has no significant electrolyte or metabolic derangements.  Influenza and COVID PCR is negative.  CTA chest obtained due to elevated D-dimer shows no evidence of PE, bacterial pneumonia findings, effusion, edema or other acute thoracic process.  On reassessment patient's heart rate is 101.  Given stable vitals with eyes reassuring exam work-up I think she is stable for discharge with close outpatient follow-up.  Impression is likely bronchitis with possibly component of pleurisy.  Recommended Tylenol and NSAIDs and will also give an Rx for some Tessalon.  COVID and flu is negative.  Discharged stable condition.  Strict return precautions advised and discussed.      ____________________________________________   FINAL CLINICAL IMPRESSION(S) / ED DIAGNOSES  Final diagnoses:  Chest pain, unspecified type  Bronchitis    Medications  iohexol (OMNIPAQUE) 350 MG/ML injection 75 mL (75 mLs Intravenous Contrast Given 06/20/21 2220)     ED Discharge Orders          Ordered    benzonatate (TESSALON PERLES) 100 MG capsule  3 times daily PRN        06/20/21 2241             Note:  This document was prepared using Dragon voice recognition software and may include unintentional  dictation errors.    Gilles Chiquito, MD 06/20/21 2242

## 2021-07-06 ENCOUNTER — Ambulatory Visit (INDEPENDENT_AMBULATORY_CARE_PROVIDER_SITE_OTHER): Payer: Medicaid Other | Admitting: Obstetrics and Gynecology

## 2021-07-06 ENCOUNTER — Encounter: Payer: Self-pay | Admitting: Obstetrics and Gynecology

## 2021-07-06 ENCOUNTER — Other Ambulatory Visit: Payer: Self-pay

## 2021-07-06 VITALS — BP 119/81 | HR 96 | Resp 16 | Ht 67.0 in | Wt 281.8 lb

## 2021-07-06 DIAGNOSIS — Z30430 Encounter for insertion of intrauterine contraceptive device: Secondary | ICD-10-CM | POA: Diagnosis not present

## 2021-07-06 DIAGNOSIS — Z3202 Encounter for pregnancy test, result negative: Secondary | ICD-10-CM | POA: Diagnosis not present

## 2021-07-06 LAB — POCT URINE PREGNANCY: Preg Test, Ur: NEGATIVE

## 2021-07-06 NOTE — Progress Notes (Signed)
HPI:      [name] is a 20 y.o. G1P1001 who LMP was No LMP recorded (lmp unknown). Patient has had an injection.  Subjective:   She presents today for IUD insertion.  She has decided upon IUD for birth control.  She was on Depo but unhappy with the weight gain.    Hx: The following portions of the patient's history were reviewed and updated as appropriate:             She  has a past medical history of Anemia, Asthma, Eczema, Environmental allergies, and History of blood transfusion. She does not have any pertinent problems on file. She  has a past surgical history that includes Foot surgery (2015); Tooth extraction (2015); and Repair vaginal cuff (N/A, 09/06/2020). Her family history includes Hypertension in her mother; Lupus in her mother; Rheum arthritis in her mother; Schizophrenia in her father. She  reports that she has never smoked. She has never used smokeless tobacco. She reports that she does not drink alcohol and does not use drugs. She has a current medication list which includes the following prescription(s): advair diskus, cetirizine, epipen 2-pak, fluticasone, montelukast, proair hfa, retin-a, triamcinolone cream, and [DISCONTINUED] nifedipine. She is allergic to other, cats claw (uncaria tomentosa), dust mite extract, and molds & smuts.       Review of Systems:  Review of Systems  Constitutional: Denied constitutional symptoms, night sweats, recent illness, fatigue, fever, insomnia and weight loss.  Eyes: Denied eye symptoms, eye pain, photophobia, vision change and visual disturbance.  Ears/Nose/Throat/Neck: Denied ear, nose, throat or neck symptoms, hearing loss, nasal discharge, sinus congestion and sore throat.  Cardiovascular: Denied cardiovascular symptoms, arrhythmia, chest pain/pressure, edema, exercise intolerance, orthopnea and palpitations.  Respiratory: Denied pulmonary symptoms, asthma, pleuritic pain, productive sputum, cough, dyspnea and wheezing.   Gastrointestinal: Denied, gastro-esophageal reflux, melena, nausea and vomiting.  Genitourinary: Denied genitourinary symptoms including symptomatic vaginal discharge, pelvic relaxation issues, and urinary complaints.  Musculoskeletal: Denied musculoskeletal symptoms, stiffness, swelling, muscle weakness and myalgia.  Dermatologic: Denied dermatology symptoms, rash and scar.  Neurologic: Denied neurology symptoms, dizziness, headache, neck pain and syncope.  Psychiatric: Denied psychiatric symptoms, anxiety and depression.  Endocrine: Denied endocrine symptoms including hot flashes and night sweats.   Meds:   Current Outpatient Medications on File Prior to Visit  Medication Sig Dispense Refill   ADVAIR DISKUS 100-50 MCG/DOSE AEPB Inhale 1 puff into the lungs 2 (two) times daily. 60 each 2   cetirizine (ZYRTEC) 5 MG tablet Take 5 mg by mouth daily.     EPIPEN 2-PAK 0.3 MG/0.3ML SOAJ injection use as directed by prescriber if needed  0   fluticasone (FLONASE) 50 MCG/ACT nasal spray   0   montelukast (SINGULAIR) 10 MG tablet 1 tablet     PROAIR HFA 108 (90 Base) MCG/ACT inhaler inhale 1 to 2 puffs every 4 to 6 hours if needed for cough if needed 18 g 2   RETIN-A 0.1 % cream APPLY TOPICALLY TO THE AFFECTED AREA AT BEDTIME 45 g 1   triamcinolone cream (KENALOG) 0.1 % 1 application to affected area     [DISCONTINUED] NIFEdipine (ADALAT CC) 30 MG 24 hr tablet Take 1 tablet (30 mg total) by mouth daily. 60 tablet 0   No current facility-administered medications on file prior to visit.    Objective:     Vitals:   07/06/21 0857  BP: 119/81  Pulse: 96  Resp: 16    Physical examination   Pelvic:  Vulva: Normal appearance.  No lesions.  Vagina: No lesions or abnormalities noted.  Support: Normal pelvic support.  Urethra No masses tenderness or scarring.  Meatus Normal size without lesions or prolapse.  Cervix: Normal appearance.  No lesions.  Anus: Normal exam.  No lesions.   Perineum: Normal exam.  No lesions.        Bimanual   Uterus: Normal size.  Non-tender.  Mobile.  AV.  Adnexae: No masses.  Non-tender to palpation.  Cul-de-sac: Negative for abnormality.   IUD Procedure Pt has read the booklet and signed the appropriate forms regarding the Mirena IUD.  All of her questions have been answered.   The cervix was cleansed with betadine solution.  After sounding the uterus and noting the position, the IUD was placed in the usual manner without problem.  The string was cut to the appropriate length.  The patient tolerated the procedure well.            [facility] # = [id number]   Assessment:    G1P1001 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   Vaginal vault hematoma 09/07/2020   Symptomatic anemia 09/07/2020   Maternal blood transfusion 09/07/2020   Obesity in pregnancy 09/07/2020   Mild pre-eclampsia in third trimester 09/05/2020   Pregnancy 08/22/2020   Asthma affecting pregnancy in third trimester 08/18/2020   Indication for care in labor or delivery 08/14/2020   Labor and delivery, indication for care 07/19/2020     1. Encounter for insertion of intrauterine contraceptive device (IUD)       Plan:             F/U  Return in about 4 weeks (around 08/03/2021) for For IUD f/u.  Elonda Husky, M.D. 07/06/2021 9:21 AM

## 2021-07-30 ENCOUNTER — Other Ambulatory Visit: Payer: Self-pay

## 2021-07-30 DIAGNOSIS — J40 Bronchitis, not specified as acute or chronic: Secondary | ICD-10-CM | POA: Insufficient documentation

## 2021-07-30 DIAGNOSIS — J069 Acute upper respiratory infection, unspecified: Secondary | ICD-10-CM | POA: Insufficient documentation

## 2021-07-30 DIAGNOSIS — Z20822 Contact with and (suspected) exposure to covid-19: Secondary | ICD-10-CM | POA: Insufficient documentation

## 2021-07-30 DIAGNOSIS — Z79899 Other long term (current) drug therapy: Secondary | ICD-10-CM | POA: Diagnosis not present

## 2021-07-30 DIAGNOSIS — R0981 Nasal congestion: Secondary | ICD-10-CM | POA: Diagnosis present

## 2021-07-30 LAB — RESP PANEL BY RT-PCR (FLU A&B, COVID) ARPGX2
Influenza A by PCR: NEGATIVE
Influenza B by PCR: NEGATIVE
SARS Coronavirus 2 by RT PCR: NEGATIVE

## 2021-07-30 NOTE — ED Triage Notes (Signed)
Pt complains of stuffy nose, drainage, red eyes, small cough, and denies any fever or chills. Hx of allergies.

## 2021-07-31 ENCOUNTER — Emergency Department
Admission: EM | Admit: 2021-07-31 | Discharge: 2021-07-31 | Disposition: A | Discharge status: Home / Self Care | Payer: Medicaid Other | Attending: Emergency Medicine | Admitting: Emergency Medicine

## 2021-07-31 DIAGNOSIS — J40 Bronchitis, not specified as acute or chronic: Secondary | ICD-10-CM

## 2021-07-31 DIAGNOSIS — J069 Acute upper respiratory infection, unspecified: Secondary | ICD-10-CM

## 2021-07-31 NOTE — ED Provider Notes (Addendum)
[facility] Emergency Department Provider Note  ____________________________________________   Event Date/Time   First MD Initiated Contact with Patient 07/31/21 0405     (approximate)  I have reviewed the triage vital signs and the nursing notes.   HISTORY  Chief Complaint Nasal Congestion   HPI Sandy Burns is a 20 y.o. female with a past medical history of anemia, asthma and eczema who presents accompanied by son for assessment of for 5 days of cough, congestion, some bilateral eye redness and some mild discomfort in the throat not described as pain or itching.  Patient has not had any earache, fevers, acute chest pain, acute back pain, abdominal pain, nausea, vomiting, diarrhea, burning with urination, rash or extremity pain.  She does not smoke or use any illegal drugs.  No other acute concerns at this time.         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   Symptomatic anemia 09/07/2020   Maternal blood transfusion 09/07/2020   Obesity in pregnancy 09/07/2020   Mild pre-eclampsia in third trimester 09/05/2020   Pregnancy 08/22/2020   Asthma affecting pregnancy in third trimester 08/18/2020   Indication for care in labor or delivery 08/14/2020   Labor and delivery, indication for care 07/19/2020    Past Surgical History:  Procedure Laterality Date   FOOT SURGERY  2015   REPAIR VAGINAL CUFF N/A 09/06/2020   Procedure: REPAIR VAGINAL CUFF;  Surgeon: Hildred Laser, MD;  Location: [facility] ORS;  Service: Gynecology;  Laterality: N/A;   TOOTH EXTRACTION  2015    Prior to Admission medications   Medication Sig Start Date End Date Taking? Authorizing Provider  ADVAIR DISKUS 100-50 MCG/DOSE AEPB  Inhale 1 puff into the lungs 2 (two) times daily. 01/08/21   Theotis Burrow, NP  cetirizine (ZYRTEC) 5 MG tablet Take 5 mg by mouth daily.    [provider]  EPIPEN 2-PAK 0.3 MG/0.3ML SOAJ injection use as directed by prescriber if needed 04/30/15   [provider]  fluticasone Aleda Grana) 50 MCG/ACT nasal spray  03/02/15   [provider]  montelukast (SINGULAIR) 10 MG tablet 1 tablet 02/04/21   [provider]  PROAIR HFA 108 (90 Base) MCG/ACT inhaler inhale 1 to 2 puffs every 4 to 6 hours if needed for cough if needed 05/10/21   Lyndon Code, MD  RETIN-A 0.1 % cream APPLY TOPICALLY TO THE AFFECTED AREA AT BEDTIME 06/21/21   Deirdre Evener, MD  triamcinolone cream (KENALOG) 0.1 % 1 application to affected area    [provider]  NIFEdipine (ADALAT CC) 30 MG 24 hr tablet Take 1 tablet (30 mg total) by mouth daily. 09/07/20 01/08/21  Hildred Laser, MD    Allergies Other, Cats claw (uncaria tomentosa), Dust mite extract, and Molds & smuts  Family History  Problem Relation Age of Onset   Rheum arthritis Mother    Hypertension Mother    Lupus Mother    Schizophrenia Father     Social History Social History   Tobacco Use   Smoking status: Never   Smokeless tobacco: Never  Vaping Use   Vaping Use: Never used  Substance Use Topics  Alcohol use: Never    Alcohol/week: 0.0 standard drinks   Drug use: Never    Review of Systems  Review of Systems  Constitutional:  Negative for chills and fever.  HENT:  Positive for congestion. Negative for sore throat.   Eyes:  Positive for discharge and redness. Negative for pain.  Respiratory:  Positive for cough. Negative for stridor.   Gastrointestinal:  Negative for vomiting.  Genitourinary:  Negative for dysuria.  Musculoskeletal:  Negative for myalgias.  Skin:  Negative for rash.  Neurological:  Negative for seizures, loss of consciousness and headaches.  Psychiatric/Behavioral:  Negative for  suicidal ideas.   All other systems reviewed and are negative.    ____________________________________________   PHYSICAL EXAM:  VITAL SIGNS: ED Triage Vitals  Enc Vitals Group     BP 07/30/21 2131 136/88     Pulse Rate 07/30/21 2131 (!) 117     Resp 07/30/21 2131 18     Temp 07/30/21 2131 98.4 F (36.9 C)     Temp Source 07/30/21 2131 Oral     SpO2 07/30/21 2131 99 %     Weight 07/30/21 2134 281 lb 12 oz (127.8 kg)     Height --      Head Circumference --      Peak Flow --      Pain Score 07/30/21 2134 0     Pain Loc --      Pain Edu? --      Excl. in GC? --    Vitals:   07/30/21 2131 07/31/21 0224  BP: 136/88 116/74  Pulse: (!) 117 (!) 102  Resp: 18 18  Temp: 98.4 F (36.9 C)   SpO2: 99% 96%   Physical Exam Vitals and nursing note reviewed.  Constitutional:      General: She is not in acute distress.    Appearance: She is well-developed.  HENT:     Head: Normocephalic and atraumatic.     Right Ear: External ear normal.     Left Ear: External ear normal.     Nose: Nose normal.  Eyes:     Conjunctiva/sclera: Conjunctivae normal.  Cardiovascular:     Rate and Rhythm: Regular rhythm. Tachycardia present.     Heart sounds: No murmur heard. Pulmonary:     Effort: Pulmonary effort is normal. No respiratory distress.     Breath sounds: Normal breath sounds.  Abdominal:     Palpations: Abdomen is soft.     Tenderness: There is no abdominal tenderness.  Musculoskeletal:     Cervical back: Neck supple.  Skin:    General: Skin is warm and dry.     Capillary Refill: Capillary refill takes less than 2 seconds.  Neurological:     Mental Status: She is alert and oriented to person, place, and time.  Psychiatric:        Mood and Affect: Mood normal.   Some mild injection bilaterally at the eyes without significant drainage.  Cranial nerves II through XII are grossly intact.  Some mild large amount of tonsils bilaterally without exudates or uvular deviation.   Patient is no stridor of her neck.  Oropharynx is otherwise unremarkable.     ____________________________________________   LABS (all labs ordered are listed, but only abnormal results are displayed)  Labs Reviewed  RESP PANEL BY RT-PCR (FLU A&B, COVID) [id number]   ____________________________________________  EKG   ____________________________________________  RADIOLOGY  ED MD interpretation:    Official radiology report(s): No results found.  ____________________________________________   PROCEDURES  Procedure(s) performed (including Critical Care):  Procedures   ____________________________________________   INITIAL IMPRESSION / ASSESSMENT AND PLAN / ED COURSE      Patient presents with above-stated history exam for several days of congestion, eye redness and cough.  On arrival patient is initially noted be slight tachycardic 117 but on my assessment her heart rate is 102.  She is denying any acute chest pain, shortness of breath, GI symptoms or fevers.  Impression is viral URI and bronchitis.  COVID and influenza PCR is negative.  Given otherwise reassuring respiratory exam with no fevers and I have low suspicion for bacterial pneumonia at this time.  Suspect likely viral infection.  This is likely as involvement of the eyes and pharynx as well.  Low suspicion for bacterial conjunctivitis or deep space infection otherwise in the head or neck.  I do not believe patient is septic.  Low suspicion for PE at this time given patient is denying any chest pain or shortness of breath and is not hypoxic.  Discussed expected clinical course.  Discharged stable condition.  Strict return precautions advised and discussed.        ____________________________________________   FINAL CLINICAL IMPRESSION(S) / ED DIAGNOSES  Final diagnoses:  Upper respiratory tract infection, unspecified type  Bronchitis    Medications - No data to display   ED Discharge Orders      None        Note:  This document was prepared using Dragon voice recognition software and may include unintentional dictation errors.    Gilles Chiquito, MD 07/31/21 0421    Gilles Chiquito, MD 07/31/21 [phone]

## 2021-08-05 ENCOUNTER — Encounter: Payer: Self-pay | Admitting: Nurse Practitioner

## 2021-08-05 ENCOUNTER — Encounter: Payer: Self-pay | Admitting: Obstetrics and Gynecology

## 2021-08-06 ENCOUNTER — Telehealth: Payer: Self-pay

## 2021-08-06 NOTE — Telephone Encounter (Signed)
Received message to schedule appointment for possible strep. Per patient, her medicaid is messed up at the moment. She declined to make appointment-[name]

## 2021-08-06 NOTE — Telephone Encounter (Signed)
I called patient to make appointment. She stated her medicaid is messed up right now. She declined to make appointment.

## 2021-08-11 ENCOUNTER — Ambulatory Visit: Payer: Medicaid Other | Admitting: Dermatology

## 2021-08-25 ENCOUNTER — Encounter: Payer: Self-pay | Admitting: Nurse Practitioner

## 2021-09-14 ENCOUNTER — Ambulatory Visit: Payer: Medicaid Other | Admitting: Nurse Practitioner

## 2021-09-14 ENCOUNTER — Other Ambulatory Visit: Payer: Self-pay

## 2021-09-14 ENCOUNTER — Encounter: Payer: Self-pay | Admitting: Obstetrics and Gynecology

## 2021-09-14 ENCOUNTER — Encounter: Payer: Self-pay | Admitting: Nurse Practitioner

## 2021-09-14 VITALS — BP 129/79 | HR 97 | Temp 98.5°F | Resp 16 | Ht 67.0 in | Wt 281.0 lb

## 2021-09-14 DIAGNOSIS — J0110 Acute frontal sinusitis, unspecified: Secondary | ICD-10-CM | POA: Diagnosis not present

## 2021-09-14 DIAGNOSIS — J3089 Other allergic rhinitis: Secondary | ICD-10-CM | POA: Diagnosis not present

## 2021-09-14 DIAGNOSIS — J4520 Mild intermittent asthma, uncomplicated: Secondary | ICD-10-CM | POA: Diagnosis not present

## 2021-09-14 MED ORDER — ALBUTEROL SULFATE HFA 108 (90 BASE) MCG/ACT IN AERS: 2.0000 | INHALATION_SPRAY | RESPIRATORY_TRACT | 5 refills | Status: DC | PRN

## 2021-09-14 MED ORDER — MONTELUKAST SODIUM 10 MG PO TABS: 10.0000 mg | ORAL_TABLET | Freq: Every day | ORAL | 1 refills | Status: DC

## 2021-09-14 MED ORDER — FLUTICASONE PROPIONATE 50 MCG/ACT NA SUSP: 1.0000 | Freq: Every day | NASAL | 5 refills | Status: DC

## 2021-09-14 MED ORDER — FLUTICASONE-SALMETEROL 100-50 MCG/ACT IN AEPB: 1.0000 | INHALATION_SPRAY | Freq: Two times a day (BID) | RESPIRATORY_TRACT | 5 refills | Status: DC

## 2021-09-14 MED ORDER — CETIRIZINE HCL 5 MG PO TABS: 5.0000 mg | ORAL_TABLET | Freq: Every day | ORAL | 1 refills | Status: DC

## 2021-09-14 MED ORDER — AMOXICILLIN-POT CLAVULANATE 875-125 MG PO TABS: 1.0000 | ORAL_TABLET | Freq: Two times a day (BID) | ORAL | 0 refills | Status: DC

## 2021-09-14 NOTE — Progress Notes (Signed)
[facility] [address] [place], [place] [address]  Internal MEDICINE  Office Visit Note  Patient Name: Sandy Burns  [id number]  [id number]  Date of Service: 09/14/2021  Chief Complaint  Patient presents with   Follow-up    Runny nose, congestion, neg covid test    Anemia   Asthma   Sore Throat    Mucus in throat    Headache    HPI Charizma presents for a follow up visit for lingering symptoms of sinusitis. She has a runny nose, congestion and sore throat and headache. She took a covid test and it was negative. She tried to deal with the symptoms at home with OTC medications but wants to get an antibiotic now.    Current Medication: Outpatient Encounter Medications as of 09/14/2021  Medication Sig Note   albuterol (VENTOLIN HFA) 108 (90 Base) MCG/ACT inhaler Inhale 2 puffs into the lungs every 4 (four) hours as needed for wheezing or shortness of breath.    amoxicillin-clavulanate (AUGMENTIN) 875-125 MG tablet Take 1 tablet by mouth 2 (two) times daily.    EPIPEN 2-PAK 0.3 MG/0.3ML SOAJ injection use as directed by prescriber if needed 05/20/2015: Received from: External Pharmacy   fluticasone-salmeterol (ADVAIR) 100-50 MCG/ACT AEPB Inhale 1 puff into the lungs 2 (two) times daily.    RETIN-A 0.1 % cream APPLY TOPICALLY TO THE AFFECTED AREA AT BEDTIME    [DISCONTINUED] ADVAIR DISKUS 100-50 MCG/DOSE AEPB Inhale 1 puff into the lungs 2 (two) times daily.    [DISCONTINUED] cetirizine (ZYRTEC) 5 MG tablet Take 5 mg by mouth daily.    [DISCONTINUED] fluticasone (FLONASE) 50 MCG/ACT nasal spray  03/30/2015: Received from: External Pharmacy   [DISCONTINUED] montelukast (SINGULAIR) 10 MG tablet 1 tablet    [DISCONTINUED] PROAIR HFA 108 (90 Base) MCG/ACT inhaler inhale 1 to 2 puffs every 4 to 6 hours if needed for cough if needed    [DISCONTINUED] triamcinolone cream (KENALOG) 0.1 % 1 application to affected area    cetirizine (ZYRTEC) 5 MG tablet Take 1 tablet (5 mg total) by  mouth daily.    fluticasone (FLONASE) 50 MCG/ACT nasal spray Place 1 spray into both nostrils daily.    montelukast (SINGULAIR) 10 MG tablet Take 1 tablet (10 mg total) by mouth at bedtime.    [DISCONTINUED] NIFEdipine (ADALAT CC) 30 MG 24 hr tablet Take 1 tablet (30 mg total) by mouth daily.    No facility-administered encounter medications on file as of 09/14/2021.    Surgical History: Past Surgical History:  Procedure Laterality Date   FOOT SURGERY  2015   REPAIR VAGINAL CUFF N/A 09/06/2020   Procedure: REPAIR VAGINAL CUFF;  Surgeon: Hildred Laser, MD;  Location: [facility] ORS;  Service: Gynecology;  Laterality: N/A;   TOOTH EXTRACTION  2015    Medical History: Past Medical History:  Diagnosis Date   Anemia    Asthma    Eczema    Environmental allergies    History of blood transfusion    after giving birth 09/06/2020    Family History: Family History  Problem Relation Age of Onset   Rheum arthritis Mother    Hypertension Mother    Lupus Mother    Schizophrenia Father     Social History   Socioeconomic History   Marital status: Single    Spouse name: Not on file   Number of children: Not on file   Years of education: Not on file   Highest education level: Not on file  Occupational  History   Not on file  Tobacco Use   Smoking status: Never   Smokeless tobacco: Never  Vaping Use   Vaping Use: Never used  Substance and Sexual Activity   Alcohol use: Never    Alcohol/week: 0.0 standard drinks   Drug use: Never   Sexual activity: Not Currently    Comment: undecided  Other Topics Concern   Not on file  Social History Narrative   Not on file   Social Determinants of Health   Financial Resource Strain: Not on file  Food Insecurity: Not on file  Transportation Needs: Not on file  Physical Activity: Not on file  Stress: Not on file  Social Connections: Not on file  Intimate Partner Violence: Not on file      Review of Systems  Constitutional:  Negative  for chills, fatigue and unexpected weight change.  HENT:  Positive for congestion, postnasal drip, rhinorrhea, sinus pressure, sinus pain and sore throat. Negative for sneezing.   Eyes:  Negative for redness.  Respiratory:  Positive for cough. Negative for chest tightness, shortness of breath and wheezing.   Cardiovascular:  Negative for chest pain and palpitations.  Gastrointestinal:  Negative for abdominal pain, constipation, diarrhea, nausea and vomiting.  Genitourinary:  Negative for dysuria and frequency.  Musculoskeletal:  Negative for arthralgias, back pain, joint swelling and neck pain.  Skin:  Negative for rash.  Neurological:  Positive for headaches. Negative for tremors and numbness.  Hematological:  Negative for adenopathy. Does not bruise/bleed easily.  Psychiatric/Behavioral:  Negative for behavioral problems (Depression), sleep disturbance and suicidal ideas. The patient is not nervous/anxious.    Vital Signs: BP 129/79   Pulse 97   Temp 98.5 F (36.9 C)   Resp 16   Ht 5\' 7"  (1.702 m)   Wt 281 lb (127.5 kg)   SpO2 97%   BMI 44.01 kg/m    Physical Exam Vitals reviewed.  Constitutional:      General: She is not in acute distress.    Appearance: Normal appearance. She is obese. She is not ill-appearing.  HENT:     Head: Normocephalic and atraumatic.     Right Ear: Tympanic membrane, ear canal and external ear normal.     Left Ear: Tympanic membrane, ear canal and external ear normal.     Nose: Congestion and rhinorrhea present.     Mouth/Throat:     Mouth: Mucous membranes are moist.     Pharynx: Posterior oropharyngeal erythema present. No oropharyngeal exudate.  Eyes:     Pupils: Pupils are equal, round, and reactive to light.  Cardiovascular:     Rate and Rhythm: Normal rate and regular rhythm.     Heart sounds: Normal heart sounds. No murmur heard. Pulmonary:     Effort: Pulmonary effort is normal. No respiratory distress.     Breath sounds: Normal breath  sounds. No wheezing.  Neurological:     Mental Status: She is alert and oriented to person, place, and time.     Cranial Nerves: No cranial nerve deficit.     Coordination: Coordination normal.     Gait: Gait normal.  Psychiatric:        Mood and Affect: Mood normal.        Behavior: Behavior normal.       Assessment/Plan: 1. Acute non-recurrent frontal sinusitis Empiric antibiotic treatment prescribed.  - amoxicillin-clavulanate (AUGMENTIN) 875-125 MG tablet; Take 1 tablet by mouth 2 (two) times daily.  Dispense: 14 tablet; Refill: 0  2. Mild intermittent asthma without complication Stable, refills ordered.  - fluticasone-salmeterol (ADVAIR) 100-50 MCG/ACT AEPB; Inhale 1 puff into the lungs 2 (two) times daily.  Dispense: 60 each; Refill: 5 - montelukast (SINGULAIR) 10 MG tablet; Take 1 tablet (10 mg total) by mouth at bedtime.  Dispense: 90 tablet; Refill: 1 - albuterol (VENTOLIN HFA) 108 (90 Base) MCG/ACT inhaler; Inhale 2 puffs into the lungs every 4 (four) hours as needed for wheezing or shortness of breath.  Dispense: 8 g; Refill: 5  3. Non-seasonal allergic rhinitis due to other allergic trigger Stable, refills ordered.  - cetirizine (ZYRTEC) 5 MG tablet; Take 1 tablet (5 mg total) by mouth daily.  Dispense: 90 tablet; Refill: 1 - fluticasone (FLONASE) 50 MCG/ACT nasal spray; Place 1 spray into both nostrils daily.  Dispense: 16 g; Refill: 5   General Counseling: Marvetta verbalizes understanding of the findings of todays visit and agrees with plan of treatment. I have discussed any further diagnostic evaluation that may be needed or ordered today. We also reviewed her medications today. she has been encouraged to call the office with any questions or concerns that should arise related to todays visit.    No orders of the defined types were placed in this encounter.   Meds ordered this encounter  Medications   cetirizine (ZYRTEC) 5 MG tablet    Sig: Take 1 tablet (5 mg  total) by mouth daily.    Dispense:  90 tablet    Refill:  1   fluticasone-salmeterol (ADVAIR) 100-50 MCG/ACT AEPB    Sig: Inhale 1 puff into the lungs 2 (two) times daily.    Dispense:  60 each    Refill:  5   fluticasone (FLONASE) 50 MCG/ACT nasal spray    Sig: Place 1 spray into both nostrils daily.    Dispense:  16 g    Refill:  5   montelukast (SINGULAIR) 10 MG tablet    Sig: Take 1 tablet (10 mg total) by mouth at bedtime.    Dispense:  90 tablet    Refill:  1   albuterol (VENTOLIN HFA) 108 (90 Base) MCG/ACT inhaler    Sig: Inhale 2 puffs into the lungs every 4 (four) hours as needed for wheezing or shortness of breath.    Dispense:  8 g    Refill:  5   amoxicillin-clavulanate (AUGMENTIN) 875-125 MG tablet    Sig: Take 1 tablet by mouth 2 (two) times daily.    Dispense:  14 tablet    Refill:  0    No follow-ups on file.   Total time spent:30 Minutes Time spent includes review of chart, medications, test results, and follow up plan with the patient.   [place] Controlled Substance Database was reviewed by me.  This patient was seen by Sallyanne Kuster, FNP-C in collaboration with Dr. Beverely Risen as a part of collaborative care agreement.   Aniruddh Ciavarella R. Tedd Sias, MSN, FNP-C Internal medicine

## 2021-09-16 ENCOUNTER — Encounter: Payer: Self-pay | Admitting: Obstetrics and Gynecology

## 2021-09-16 ENCOUNTER — Ambulatory Visit (INDEPENDENT_AMBULATORY_CARE_PROVIDER_SITE_OTHER): Payer: Medicaid Other | Admitting: Obstetrics and Gynecology

## 2021-09-16 ENCOUNTER — Other Ambulatory Visit: Payer: Self-pay

## 2021-09-16 VITALS — BP 130/87 | HR 91 | Resp 16 | Ht 67.0 in | Wt 278.5 lb

## 2021-09-16 DIAGNOSIS — Z30431 Encounter for routine checking of intrauterine contraceptive device: Secondary | ICD-10-CM | POA: Diagnosis not present

## 2021-09-16 NOTE — Progress Notes (Signed)
HPI:      [name] is a 20 y.o. G1P1001 who LMP was Sandy Burns's last menstrual period was 07/13/2021.  Subjective:   She presents today for follow-up of her IUD.  She reports no problems with it.  She says she had a week of bleeding after insertion but is not bleeding now.  She has had intercourse without issue.  She states that she has tried to feel her strings but cannot feel them.    Hx: The following portions of the Sandy Burns's history were reviewed and updated as appropriate:             She  has a past medical history of Anemia, Asthma, Eczema, Environmental allergies, and History of blood transfusion. She does not have any pertinent problems on file. She  has a past surgical history that includes Foot surgery (2015); Tooth extraction (2015); and Repair vaginal cuff (N/A, 09/06/2020). Her family history includes Hypertension in her mother; Lupus in her mother; Rheum arthritis in her mother; Schizophrenia in her father. She  reports that she has never smoked. She has never used smokeless tobacco. She reports that she does not drink alcohol and does not use drugs. She has a current medication list which includes the following prescription(s): albuterol, amoxicillin-clavulanate, cetirizine, epipen 2-pak, fluticasone, fluticasone-salmeterol, montelukast, retin-a, and [DISCONTINUED] nifedipine. She is allergic to other, cats claw (uncaria tomentosa), dust mite extract, and molds & smuts.       Review of Systems:  Review of Systems  Constitutional: Denied constitutional symptoms, night sweats, recent illness, fatigue, fever, insomnia and weight loss.  Eyes: Denied eye symptoms, eye pain, photophobia, vision change and visual disturbance.  Ears/Nose/Throat/Neck: Denied ear, nose, throat or neck symptoms, hearing loss, nasal discharge, sinus congestion and sore throat.  Cardiovascular: Denied cardiovascular symptoms, arrhythmia, chest pain/pressure, edema, exercise intolerance, orthopnea  and palpitations.  Respiratory: Denied pulmonary symptoms, asthma, pleuritic pain, productive sputum, cough, dyspnea and wheezing.  Gastrointestinal: Denied, gastro-esophageal reflux, melena, nausea and vomiting.  Genitourinary: Denied genitourinary symptoms including symptomatic vaginal discharge, pelvic relaxation issues, and urinary complaints.  Musculoskeletal: Denied musculoskeletal symptoms, stiffness, swelling, muscle weakness and myalgia.  Dermatologic: Denied dermatology symptoms, rash and scar.  Neurologic: Denied neurology symptoms, dizziness, headache, neck pain and syncope.  Psychiatric: Denied psychiatric symptoms, anxiety and depression.  Endocrine: Denied endocrine symptoms including hot flashes and night sweats.   Meds:   Current Outpatient Medications on File Prior to Visit  Medication Sig Dispense Refill   albuterol (VENTOLIN HFA) 108 (90 Base) MCG/ACT inhaler Inhale 2 puffs into the lungs every 4 (four) hours as needed for wheezing or shortness of breath. 8 g 5   amoxicillin-clavulanate (AUGMENTIN) 875-125 MG tablet Take 1 tablet by mouth 2 (two) times daily. 14 tablet 0   cetirizine (ZYRTEC) 5 MG tablet Take 1 tablet (5 mg total) by mouth daily. 90 tablet 1   EPIPEN 2-PAK 0.3 MG/0.3ML SOAJ injection use as directed by prescriber if needed  0   fluticasone (FLONASE) 50 MCG/ACT nasal spray Place 1 spray into both nostrils daily. 16 g 5   fluticasone-salmeterol (ADVAIR) 100-50 MCG/ACT AEPB Inhale 1 puff into the lungs 2 (two) times daily. 60 each 5   montelukast (SINGULAIR) 10 MG tablet Take 1 tablet (10 mg total) by mouth at bedtime. 90 tablet 1   RETIN-A 0.1 % cream APPLY TOPICALLY TO THE AFFECTED AREA AT BEDTIME 45 g 1   [DISCONTINUED] NIFEdipine (ADALAT CC) 30 MG 24 hr tablet Take 1 tablet (30 mg  total) by mouth daily. 60 tablet 0   No current facility-administered medications on file prior to visit.      Objective:     Vitals:   09/16/21 0928  BP: 130/87   Pulse: 91  Resp: 16   Filed Weights   09/16/21 0928  Weight: 278 lb 8 oz (126.3 kg)              Physical examination   Pelvic:   Vulva: Normal appearance.  No lesions.  Vagina: No lesions or abnormalities noted.  Support: Normal pelvic support.  Urethra No masses tenderness or scarring.  Meatus Normal size without lesions or prolapse.  Cervix: Normal appearance.  No lesions. IUD strings noted at cervical just inside os.  Anus: Normal exam.  No lesions.  Perineum: Normal exam.  No lesions.        Bimanual   Uterus: Normal size.  Non-tender.  Mobile.  AV.  Adnexae: No masses.  Non-tender to palpation.  Cul-de-sac: Negative for abnormality.             Assessment:    G1P1001 Sandy Burns Active Problem List   Diagnosis Date Noted   Delay 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/07/2020   Vaginal vault hematoma 09/07/2020   Symptomatic anemia 09/07/2020   Maternal blood transfusion 09/07/2020   Obesity in pregnancy 09/07/2020   Mild pre-eclampsia in third trimester 09/05/2020   Pregnancy 08/22/2020   Asthma affecting pregnancy in third trimester 08/18/2020   Indication for care in labor or delivery 08/14/2020   Labor and delivery, indication for care 07/19/2020     1. Surveillance of previously prescribed intrauterine contraceptive device        Plan:            1.  Sandy Burns to return for annual examination in approximately 3 months.  This will be her first Pap at that time. Orders No orders of the defined types were placed in this encounter.   No orders of the defined types were placed in this encounter.     F/U  Return in about 3 months (around 12/15/2021) for Annual Physical. I spent 15 minutes involved in the care of this Sandy Burns preparing to see the Sandy Burns by obtaining and reviewing her medical history (including labs, imaging tests and prior procedures), documenting clinical information in the electronic health record (EHR), counseling and coordinating care plans, writing and  sending prescriptions, ordering tests or procedures and in direct communicating with the Sandy Burns and medical staff discussing pertinent items from her history and physical exam.  Finis Bud, M.D. 09/16/2021 9:52 AM

## 2021-09-27 ENCOUNTER — Encounter: Payer: Self-pay | Admitting: Nurse Practitioner

## 2021-09-27 ENCOUNTER — Telehealth: Payer: Self-pay

## 2021-09-28 ENCOUNTER — Telehealth (INDEPENDENT_AMBULATORY_CARE_PROVIDER_SITE_OTHER): Payer: Medicaid Other | Admitting: Nurse Practitioner

## 2021-09-28 ENCOUNTER — Encounter: Payer: Self-pay | Admitting: Nurse Practitioner

## 2021-09-28 ENCOUNTER — Telehealth: Payer: Self-pay

## 2021-09-28 VITALS — Temp 97.5°F | Resp 16 | Ht 67.0 in | Wt 280.0 lb

## 2021-09-28 DIAGNOSIS — J0110 Acute frontal sinusitis, unspecified: Secondary | ICD-10-CM | POA: Diagnosis not present

## 2021-09-28 DIAGNOSIS — R051 Acute cough: Secondary | ICD-10-CM | POA: Diagnosis not present

## 2021-09-28 MED ORDER — BENZONATATE 100 MG PO CAPS: 100.0000 mg | ORAL_CAPSULE | Freq: Two times a day (BID) | ORAL | 0 refills | Status: DC | PRN

## 2021-09-28 MED ORDER — DOXYCYCLINE HYCLATE 100 MG PO TABS: 100.0000 mg | ORAL_TABLET | Freq: Two times a day (BID) | ORAL | 0 refills | Status: DC

## 2021-09-28 NOTE — Progress Notes (Signed)
[facility] [address] [place], [place] [address]  Internal MEDICINE  Telephone Visit  Patient Name: Sandy Burns  [id number]  [id number]  Date of Service: 09/28/2021  I connected with the patient at 12:30 PM by telephone and verified the patients identity using two identifiers.   I discussed the limitations, risks, security and privacy concerns of performing an evaluation and management service by telephone and the availability of in person appointments. I also discussed with the patient that there may be a patient responsible charge related to the service.  The patient expressed understanding and agrees to proceed.    Chief Complaint  Patient presents with   Telephone Assessment    Covid test negative symptoms start Thursday    Telephone Screen    [phone]   Sinusitis   Sore Throat   Cough    Greenish     HPI Sandy Burns presents for a telehealth virtual visit for symptoms of sinusitis. She was negative for covid today. She has cough, sore throat, fatigue, runny nose, headache, SOB, chest tightness. She denies fever, chills, body aches.    Current Medication: Outpatient Encounter Medications as of 09/28/2021  Medication Sig Note   albuterol (VENTOLIN HFA) 108 (90 Base) MCG/ACT inhaler Inhale 2 puffs into the lungs every 4 (four) hours as needed for wheezing or shortness of breath.    amoxicillin-clavulanate (AUGMENTIN) 875-125 MG tablet Take 1 tablet by mouth 2 (two) times daily.    benzonatate (TESSALON) 100 MG capsule Take 1 capsule (100 mg total) by mouth 2 (two) times daily as needed for cough.    cetirizine (ZYRTEC) 5 MG tablet Take 1 tablet (5 mg total) by mouth daily.    doxycycline (VIBRA-TABS) 100 MG tablet Take 1 tablet (100 mg total) by mouth 2 (two) times daily. With food    EPIPEN 2-PAK 0.3 MG/0.3ML SOAJ injection use as directed by prescriber if needed 05/20/2015: Received from: External Pharmacy   fluticasone (FLONASE) 50 MCG/ACT nasal spray Place 1  spray into both nostrils daily.    fluticasone-salmeterol (ADVAIR) 100-50 MCG/ACT AEPB Inhale 1 puff into the lungs 2 (two) times daily.    montelukast (SINGULAIR) 10 MG tablet Take 1 tablet (10 mg total) by mouth at bedtime.    RETIN-A 0.1 % cream APPLY TOPICALLY TO THE AFFECTED AREA AT BEDTIME    [DISCONTINUED] NIFEdipine (ADALAT CC) 30 MG 24 hr tablet Take 1 tablet (30 mg total) by mouth daily.    No facility-administered encounter medications on file as of 09/28/2021.    Surgical History: Past Surgical History:  Procedure Laterality Date   FOOT SURGERY  2015   REPAIR VAGINAL CUFF N/A 09/06/2020   Procedure: REPAIR VAGINAL CUFF;  Surgeon: Hildred Laser, MD;  Location: [facility] ORS;  Service: Gynecology;  Laterality: N/A;   TOOTH EXTRACTION  2015    Medical History: Past Medical History:  Diagnosis Date   Anemia    Asthma    Eczema    Environmental allergies    History of blood transfusion    after giving birth 09/06/2020    Family History: Family History  Problem Relation Age of Onset   Rheum arthritis Mother    Hypertension Mother    Lupus Mother    Schizophrenia Father     Social History   Socioeconomic History   Marital status: Single    Spouse name: Not on file   Number of children: Not on file   Years of education: Not on file   Highest  education level: Not on file  Occupational History   Not on file  Tobacco Use   Smoking status: Never   Smokeless tobacco: Never  Vaping Use   Vaping Use: Never used  Substance and Sexual Activity   Alcohol use: Never    Alcohol/week: 0.0 standard drinks   Drug use: Never   Sexual activity: Not Currently    Comment: undecided  Other Topics Concern   Not on file  Social History Narrative   Not on file   Social Determinants of Health   Financial Resource Strain: Not on file  Food Insecurity: Not on file  Transportation Needs: Not on file  Physical Activity: Not on file  Stress: Not on file  Social Connections:  Not on file  Intimate Partner Violence: Not on file      Review of Systems  Constitutional:  Positive for fatigue.  HENT:  Positive for congestion, postnasal drip, rhinorrhea, sinus pressure, sinus pain and sore throat.   Respiratory:  Positive for cough, chest tightness and shortness of breath. Negative for wheezing.   Cardiovascular:  Negative for chest pain and palpitations.  Gastrointestinal:  Negative for abdominal pain, constipation, diarrhea, nausea and vomiting.  Musculoskeletal:  Negative for myalgias.  Neurological:  Positive for headaches.   Vital Signs: Temp (!) 97.5 F (36.4 C)    Resp 16    Ht 5\' 7"  (1.702 m)    Wt 280 lb (127 kg)    BMI 43.85 kg/m    Observation/Objective: She is alert and oriented and engages in conversation appropriately.  She does not sound as though she is in any acute distress over telephone call.    Assessment/Plan: 1. Acute non-recurrent frontal sinusitis Empiric antibiotic treatment prescribed.  - doxycycline (VIBRA-TABS) 100 MG tablet; Take 1 tablet (100 mg total) by mouth 2 (two) times daily. With food  Dispense: 14 tablet; Refill: 0  2. Acute cough Medication prescribed for symptomatic relief of cough - benzonatate (TESSALON) 100 MG capsule; Take 1 capsule (100 mg total) by mouth 2 (two) times daily as needed for cough.  Dispense: 30 capsule; Refill: 0   General Counseling: Sandy Burns verbalizes understanding of the findings of today's phone visit and agrees with plan of treatment. I have discussed any further diagnostic evaluation that may be needed or ordered today. We also reviewed her medications today. she has been encouraged to call the office with any questions or concerns that should arise related to todays visit.  Return if symptoms worsen or fail to improve.   No orders of the defined types were placed in this encounter.   Meds ordered this encounter  Medications   doxycycline (VIBRA-TABS) 100 MG tablet    Sig: Take 1  tablet (100 mg total) by mouth 2 (two) times daily. With food    Dispense:  14 tablet    Refill:  0   benzonatate (TESSALON) 100 MG capsule    Sig: Take 1 capsule (100 mg total) by mouth 2 (two) times daily as needed for cough.    Dispense:  30 capsule    Refill:  0    Time spent:10 Minutes Time spent with patient included reviewing progress notes, labs, imaging studies, and discussing plan for follow up.  [place] Controlled Substance Database was reviewed by me for overdose risk score (ORS) if appropriate.  This patient was seen by , FNP-C in collaboration with Dr. Sallyanne Kuster as a part of collaborative care agreement.  Jonni Oelkers R. Beverely Risen, MSN, FNP-C Internal  medicine

## 2021-09-28 NOTE — Telephone Encounter (Signed)
Per Alyssa's request, emailed work note to [employer]

## 2021-09-29 NOTE — Telephone Encounter (Signed)
error 

## 2021-10-09 ENCOUNTER — Encounter: Payer: Self-pay | Admitting: Nurse Practitioner

## 2021-10-17 ENCOUNTER — Encounter: Payer: Self-pay | Admitting: Nurse Practitioner

## 2021-10-23 ENCOUNTER — Encounter: Payer: Self-pay | Admitting: Nurse Practitioner

## 2021-10-25 ENCOUNTER — Encounter: Payer: Self-pay | Admitting: Nurse Practitioner

## 2021-10-25 ENCOUNTER — Telehealth: Payer: Medicaid Other | Admitting: Nurse Practitioner

## 2021-10-25 ENCOUNTER — Telehealth: Payer: Self-pay

## 2021-10-25 VITALS — Temp 99.6°F | Resp 16 | Ht 67.0 in | Wt 280.0 lb

## 2021-10-25 DIAGNOSIS — R051 Acute cough: Secondary | ICD-10-CM | POA: Diagnosis not present

## 2021-10-25 DIAGNOSIS — J0111 Acute recurrent frontal sinusitis: Secondary | ICD-10-CM

## 2021-10-25 MED ORDER — LEVOFLOXACIN 500 MG PO TABS: 500.0000 mg | ORAL_TABLET | Freq: Every day | ORAL | 0 refills | Status: AC

## 2021-10-25 NOTE — Telephone Encounter (Signed)
Referral sent via [facility] to [name] ?

## 2021-10-25 NOTE — Progress Notes (Signed)
[facility] [address] [place], [place] [id number]  Internal MEDICINE  Telephone Visit  Patient Name: Sandy BuccoDaniya Dufner  [id number] 26, 2002  [id number]  Date of Service: 10/25/2021  I connected with the patient at 1:25 PM by telephone and verified the patients identity using two identifiers.   I discussed the limitations, risks, security and privacy concerns of performing an evaluation and management service by telephone and the availability of in person appointments. I also discussed with the patient that there may be a patient responsible charge related to the service.  The patient expressed understanding and agrees to proceed.    Chief Complaint  Patient presents with   Acute Visit    Bump under tongue, noticed 10-17-21 that is also when symptoms started    Telephone Assessment    [phone]   Telephone Screen    video   Sore Throat   Sinusitis    Stuffy and runny nose   Cough   Fatigue    HPI Harley AltoDaniya presents for a telehealth virtual visit for symptoms of sinusitis. She reports having a sore throat, nasal congestion, runny nose, cough and fatigue. She noticed a bump under her tongue on 10/17/21 and that's when her symptoms started.    Current Medication: Outpatient Encounter Medications as of 10/25/2021  Medication Sig Note   albuterol (VENTOLIN HFA) 108 (90 Base) MCG/ACT inhaler Inhale 2 puffs into the lungs every 4 (four) hours as needed for wheezing or shortness of breath.    benzonatate (TESSALON) 100 MG capsule Take 1 capsule (100 mg total) by mouth 2 (two) times daily as needed for cough.    cetirizine (ZYRTEC) 5 MG tablet Take 1 tablet (5 mg total) by mouth daily.    doxycycline (VIBRA-TABS) 100 MG tablet Take 1 tablet (100 mg total) by mouth 2 (two) times daily. With food    EPIPEN 2-PAK 0.3 MG/0.3ML SOAJ injection use as directed by prescriber if needed 05/20/2015: Received from: External Pharmacy   fluticasone (FLONASE) 50 MCG/ACT nasal spray Place 1 spray into both  nostrils daily.    fluticasone-salmeterol (ADVAIR) 100-50 MCG/ACT AEPB Inhale 1 puff into the lungs 2 (two) times daily.    levofloxacin (LEVAQUIN) 500 MG tablet Take 1 tablet (500 mg total) by mouth daily for 7 days.    montelukast (SINGULAIR) 10 MG tablet Take 1 tablet (10 mg total) by mouth at bedtime.    RETIN-A 0.1 % cream APPLY TOPICALLY TO THE AFFECTED AREA AT BEDTIME    [DISCONTINUED] amoxicillin-clavulanate (AUGMENTIN) 875-125 MG tablet Take 1 tablet by mouth 2 (two) times daily. (Patient not taking: Reported on 10/25/2021)    [DISCONTINUED] NIFEdipine (ADALAT CC) 30 MG 24 hr tablet Take 1 tablet (30 mg total) by mouth daily.    No facility-administered encounter medications on file as of 10/25/2021.    Surgical History: Past Surgical History:  Procedure Laterality Date   FOOT SURGERY  2015   REPAIR VAGINAL CUFF N/A 09/06/2020   Procedure: REPAIR VAGINAL CUFF;  Surgeon: Hildred Laserherry, Anika, MD;  Location: [facility] ORS;  Service: Gynecology;  Laterality: N/A;   TOOTH EXTRACTION  2015    Medical History: Past Medical History:  Diagnosis Date   Anemia    Asthma    Eczema    Environmental allergies    History of blood transfusion    after giving birth 09/06/2020    Family History: Family History  Problem Relation Age of Onset   Rheum arthritis Mother    Hypertension Mother  Lupus Mother    Schizophrenia Father     Social History   Socioeconomic History   Marital status: Single    Spouse name: Not on file   Number of children: Not on file   Years of education: Not on file   Highest education level: Not on file  Occupational History   Not on file  Tobacco Use   Smoking status: Never   Smokeless tobacco: Never  Vaping Use   Vaping Use: Never used  Substance and Sexual Activity   Alcohol use: Never    Alcohol/week: 0.0 standard drinks   Drug use: Never   Sexual activity: Not Currently    Comment: undecided  Other Topics Concern   Not on file  Social History  Narrative   Not on file   Social Determinants of Health   Financial Resource Strain: Not on file  Food Insecurity: Not on file  Transportation Needs: Not on file  Physical Activity: Not on file  Stress: Not on file  Social Connections: Not on file  Intimate Partner Violence: Not on file      Review of Systems  Constitutional:  Positive for fatigue. Negative for chills and fever.  HENT:  Positive for congestion, postnasal drip, rhinorrhea, sinus pressure, sinus pain and sore throat. Negative for ear pain and trouble swallowing.   Respiratory:  Positive for cough. Negative for chest tightness, shortness of breath and wheezing.   Cardiovascular: Negative.  Negative for chest pain and palpitations.  Gastrointestinal: Negative.  Negative for abdominal pain, constipation, diarrhea, nausea and vomiting.  Skin:  Negative for rash.  Neurological:  Negative for dizziness, light-headedness and headaches.   Vital Signs: Temp 99.6 F (37.6 C)    Resp 16    Ht 5\' 7"  (1.702 m)    Wt 280 lb (127 kg)    BMI 43.85 kg/m    Observation/Objective: She is alert and oriented and engages in conversation appropriately. She does not appear to be in any acute distress via video call.     Assessment/Plan: 1. Acute recurrent frontal sinusitis Recurrent sinus infection or relapsing. Empiric antibiotic treatment prescribed. Referral to ENT for further evaluation.  - levofloxacin (LEVAQUIN) 500 MG tablet; Take 1 tablet (500 mg total) by mouth daily for 7 days.  Dispense: 7 tablet; Refill: 0 - Ambulatory referral to ENT  2. Acute cough Manageable with current medications, still has some of the benzonatate at home from last time.    General Counseling: Helem verbalizes understanding of the findings of today's phone visit and agrees with plan of treatment. I have discussed any further diagnostic evaluation that may be needed or ordered today. We also reviewed her medications today. she has been  encouraged to call the office with any questions or concerns that should arise related to todays visit.  Return if symptoms worsen or fail to improve.   Orders Placed This Encounter  Procedures   Ambulatory referral to ENT    Meds ordered this encounter  Medications   levofloxacin (LEVAQUIN) 500 MG tablet    Sig: Take 1 tablet (500 mg total) by mouth daily for 7 days.    Dispense:  7 tablet    Refill:  0    Time spent:10 Minutes Time spent with patient included reviewing progress notes, labs, imaging studies, and discussing plan for follow up.  [place] Controlled Substance Database was reviewed by me for overdose risk score (ORS) if appropriate.  This patient was seen by , FNP-C in collaboration  with Dr. Beverely Risen as a part of collaborative care agreement.  Julitza Rickles R. Tedd Sias, MSN, FNP-C Internal medicine

## 2021-10-27 NOTE — Telephone Encounter (Signed)
ENT appointment> 11/30/21 @ 9:20-Sandy Burns

## 2021-11-02 ENCOUNTER — Encounter: Payer: Medicaid Other | Admitting: Obstetrics and Gynecology

## 2021-11-11 ENCOUNTER — Other Ambulatory Visit: Payer: Self-pay

## 2021-11-11 ENCOUNTER — Encounter: Payer: Self-pay | Admitting: Nurse Practitioner

## 2021-11-11 DIAGNOSIS — R051 Acute cough: Secondary | ICD-10-CM

## 2021-11-11 MED ORDER — BENZONATATE 100 MG PO CAPS: 100.0000 mg | ORAL_CAPSULE | Freq: Two times a day (BID) | ORAL | 0 refills | Status: DC | PRN

## 2021-11-11 MED ORDER — AZITHROMYCIN 250 MG PO TABS: ORAL_TABLET | ORAL | 0 refills | Status: AC

## 2021-11-11 NOTE — Telephone Encounter (Signed)
Pt called C/O her throat is hurting when coughing and when she coughs it hurts she states its not covid due to her taking a covid test on 11/09/21. She is having drainage and coughing up brownish green mucus, no fever.  Advised pt that we sent a z-pak and benzonatate to her pharmacy and she can use mucinex and also gargle warm salt water and take her allergy meds.  Informed pt that if she doesn't get better to call and be seen

## 2021-11-11 NOTE — Telephone Encounter (Signed)
Please see this

## 2021-11-16 ENCOUNTER — Other Ambulatory Visit: Payer: Self-pay

## 2021-11-16 ENCOUNTER — Ambulatory Visit (INDEPENDENT_AMBULATORY_CARE_PROVIDER_SITE_OTHER): Payer: Medicaid Other | Admitting: Dermatology

## 2021-11-16 DIAGNOSIS — L700 Acne vulgaris: Secondary | ICD-10-CM | POA: Diagnosis not present

## 2021-11-16 DIAGNOSIS — L309 Dermatitis, unspecified: Secondary | ICD-10-CM | POA: Diagnosis not present

## 2021-11-16 DIAGNOSIS — L906 Striae atrophicae: Secondary | ICD-10-CM | POA: Diagnosis not present

## 2021-11-16 MED ORDER — EUCRISA 2 % EX OINT
1.0000 | TOPICAL_OINTMENT | CUTANEOUS | 1 refills | Status: DC
Start: 2021-11-16 — End: 2024-02-13

## 2021-11-16 MED ORDER — CLINDAMYCIN PHOS-BENZOYL PEROX 1.2-5 % EX GEL: 1.0000 "application " | Freq: Every morning | CUTANEOUS | 1 refills | Status: AC

## 2021-11-16 MED ORDER — TRETINOIN 0.1 % EX CREA: TOPICAL_CREAM | CUTANEOUS | 1 refills | Status: DC

## 2021-11-16 NOTE — Progress Notes (Signed)
Follow-Up Visit   Subjective  Sandy Burns is a 21 y.o. female who presents for the following: Acne (Face, 52m f/u, Benzaclin gel qam, Retin A 0.1% cr qhs, benzaclin not covered so she is using her sisters, would like an alternative) and Eczema (Bil hands/fingers, tried mometasone but didn't help).  The following portions of the chart were reviewed this encounter and updated as appropriate:   Tobacco   Allergies   Meds   Problems   Med Hx   Surg Hx   Fam Hx      Review of Systems:  No other skin or systemic complaints except as noted in HPI or Assessment and Plan.  Objective  Well appearing patient in no apparent distress; mood and affect are within normal limits.  A focused examination was performed including face, hands. Relevant physical exam findings are noted in the Assessment and Plan.  face Face clear  arms, hands Striae anticubital area Patchy roughness scale to hands   Assessment & Plan  Acne vulgaris face  Chronic and persistent condition with duration or expected duration over one year. Condition is symptomatic / bothersome to patient. Not to goal.   Start Duac gel qam Cont Retin A 1% cr qhs  Topical retinoid medications like tretinoin/Retin-A, adapalene/Differin, tazarotene/Fabior, and Epiduo/Epiduo Forte can cause dryness and irritation when first started. Only apply a pea-sized amount to the entire affected area. Avoid applying it around the eyes, edges of mouth and creases at the nose. If you experience irritation, use a good moisturizer first and/or apply the medicine less often. If you are doing well with the medicine, you can increase how often you use it until you are applying every night. Be careful with sun protection while using this medication as it can make you sensitive to the sun. This medicine should not be used by pregnant women.    Clindamycin-Benzoyl Per, Refr, (DUAC) gel - face Apply 1 application topically every morning. Qam to face for  acne  Related Medications tretinoin (RETIN-A) 0.1 % cream Pea size amount to face nightly  Eczema arms, hands Atopic dermatitis (eczema) is a chronic, relapsing, pruritic condition that can significantly affect quality of life. It is often associated with allergic rhinitis and/or asthma and can require treatment with topical medications, phototherapy, or in severe cases biologic injectable medication (Dupixent; Adbry) or Oral JAK inhibitors.   Avoid topical steroid to arms due to striae of antecubital areas Start Eucrisa oint qd/bid to hands, arms May use mometasone qd to hands until improved then d/c  Topical steroids (such as triamcinolone, fluocinolone, fluocinonide, mometasone, clobetasol, halobetasol, betamethasone, hydrocortisone) can cause thinning and lightening of the skin if they are used for too long in the same area. Your physician has selected the right strength medicine for your problem and area affected on the body. Please use your medication only as directed by your physician to prevent side effects.    Crisaborole (EUCRISA) 2 % OINT - arms, hands Apply 1 application topically as directed. Qd to bid to eczema on hands, arms until clear  Striae of arms - antecubital areas Unsure of etiology - possibly from growth or from past topical steroid use? Advised in past and again today to NOT use topical steroids to any areas of arms.  Pt understands.  Return in about 6 months (around 05/16/2022) for Acne f/u, Atopic Derm.  I, Ardis Rowan, RMA, am acting as scribe for Armida Sans, MD . Documentation: I have reviewed the above documentation for accuracy  and completeness, and I agree with the above.  Sarina Ser, MD

## 2021-11-16 NOTE — Patient Instructions (Signed)

## 2021-11-17 ENCOUNTER — Encounter: Payer: Self-pay | Admitting: Dermatology

## 2021-11-30 DIAGNOSIS — J329 Chronic sinusitis, unspecified: Secondary | ICD-10-CM | POA: Diagnosis not present

## 2021-11-30 DIAGNOSIS — R0981 Nasal congestion: Secondary | ICD-10-CM | POA: Diagnosis not present

## 2021-11-30 DIAGNOSIS — J309 Allergic rhinitis, unspecified: Secondary | ICD-10-CM | POA: Diagnosis not present

## 2021-12-06 DIAGNOSIS — J301 Allergic rhinitis due to pollen: Secondary | ICD-10-CM | POA: Diagnosis not present

## 2021-12-15 ENCOUNTER — Encounter: Payer: Medicaid Other | Admitting: Obstetrics and Gynecology

## 2021-12-16 ENCOUNTER — Ambulatory Visit (INDEPENDENT_AMBULATORY_CARE_PROVIDER_SITE_OTHER): Payer: Medicaid Other | Admitting: Obstetrics and Gynecology

## 2021-12-16 ENCOUNTER — Other Ambulatory Visit: Payer: Self-pay

## 2021-12-16 ENCOUNTER — Other Ambulatory Visit (HOSPITAL_COMMUNITY)
Admission: RE | Admit: 2021-12-16 | Discharge: 2021-12-16 | Disposition: A | Discharge status: Home / Self Care | Payer: Medicaid Other | Source: Ambulatory Visit | Attending: Obstetrics and Gynecology | Admitting: Obstetrics and Gynecology

## 2021-12-16 ENCOUNTER — Encounter: Payer: Self-pay | Admitting: Obstetrics and Gynecology

## 2021-12-16 VITALS — BP 122/85 | HR 85 | Ht 67.0 in | Wt 271.4 lb

## 2021-12-16 DIAGNOSIS — Z124 Encounter for screening for malignant neoplasm of cervix: Secondary | ICD-10-CM | POA: Insufficient documentation

## 2021-12-16 DIAGNOSIS — Z01419 Encounter for gynecological examination (general) (routine) without abnormal findings: Secondary | ICD-10-CM

## 2021-12-16 NOTE — Progress Notes (Signed)
Patients presents for annual exam today. Patient states doing well at this time. She states she occasionally has little bumps in the crease of her thigh that she believes is due to irritation that she would like to discuss. Patient is due for pap smear, ordered. Patient states no other questions or concerns at this time.   ?

## 2021-12-16 NOTE — Progress Notes (Signed)
HPI: ?     Sandy Burns is a 21 y.o. G1P1001 who LMP was No LMP recorded (lmp unknown). (Menstrual status: IUD). ? ?Subjective:  ? ?She presents today for her annual examination.  She has an IUD in place and occasionally has some spotting but no real periods.  She presents today for for her first Pap smear.  She also states that she occasionally gets "bumps" in her labial/groin area  ? ?  Hx: ?The following portions of the patient's history were reviewed and updated as appropriate: ?            She  has a past medical history of Anemia, Asthma, Eczema, Environmental allergies, and History of blood transfusion. ?She does not have any pertinent problems on file. ?She  has a past surgical history that includes Foot surgery (2015); Tooth extraction (2015); and Repair vaginal cuff (N/A, 09/06/2020). ?Her family history includes Hypertension in her mother; Lupus in her mother; Rheum arthritis in her mother; Schizophrenia in her father. ?She  reports that she has never smoked. She has never used smokeless tobacco. She reports that she does not drink alcohol and does not use drugs. ?She has a current medication list which includes the following prescription(s): albuterol, cetirizine, clindamycin-benzoyl per (refr), eucrisa, epipen 2-pak, fluticasone, fluticasone-salmeterol, montelukast, multiple vitamin, tretinoin, and [DISCONTINUED] nifedipine. ?She is allergic to other, cats claw (uncaria tomentosa), dust mite extract, molds & smuts, and tree extract. ?      ?Review of Systems:  ?Review of Systems ? ?Constitutional: Denied constitutional symptoms, night sweats, recent illness, fatigue, fever, insomnia and weight loss.  ?Eyes: Denied eye symptoms, eye pain, photophobia, vision change and visual disturbance.  ?Ears/Nose/Throat/Neck: Denied ear, nose, throat or neck symptoms, hearing loss, nasal discharge, sinus congestion and sore throat.  ?Cardiovascular: Denied cardiovascular symptoms, arrhythmia, chest  pain/pressure, edema, exercise intolerance, orthopnea and palpitations.  ?Respiratory: Denied pulmonary symptoms, asthma, pleuritic pain, productive sputum, cough, dyspnea and wheezing.  ?Gastrointestinal: Denied, gastro-esophageal reflux, melena, nausea and vomiting.  ?Genitourinary: See HPI for additional information.  ?Musculoskeletal: Denied musculoskeletal symptoms, stiffness, swelling, muscle weakness and myalgia.  ?Dermatologic: Denied dermatology symptoms, rash and scar.  ?Neurologic: Denied neurology symptoms, dizziness, headache, neck pain and syncope.  ?Psychiatric: Denied psychiatric symptoms, anxiety and depression.  ?Endocrine: Denied endocrine symptoms including hot flashes and night sweats.  ? ?Meds: ?  ?Current Outpatient Medications on File Prior to Visit  ?Medication Sig Dispense Refill  ? albuterol (VENTOLIN HFA) 108 (90 Base) MCG/ACT inhaler Inhale 2 puffs into the lungs every 4 (four) hours as needed for wheezing or shortness of breath. 8 g 5  ? cetirizine (ZYRTEC) 5 MG tablet Take 1 tablet (5 mg total) by mouth daily. 90 tablet 1  ? Clindamycin-Benzoyl Per, Refr, (DUAC) gel Apply 1 application topically every morning. Qam to face for acne 135 g 1  ? Crisaborole (EUCRISA) 2 % OINT Apply 1 application topically as directed. Qd to bid to eczema on hands, arms until clear 180 g 1  ? EPIPEN 2-PAK 0.3 MG/0.3ML SOAJ injection use as directed by prescriber if needed  0  ? fluticasone (FLONASE) 50 MCG/ACT nasal spray Place 1 spray into both nostrils daily. 16 g 5  ? fluticasone-salmeterol (ADVAIR) 100-50 MCG/ACT AEPB Inhale 1 puff into the lungs 2 (two) times daily. 60 each 5  ? montelukast (SINGULAIR) 10 MG tablet Take 1 tablet (10 mg total) by mouth at bedtime. 90 tablet 1  ? Multiple Vitamin (MULTIVITAMIN PO) Take by mouth.    ?  tretinoin (RETIN-A) 0.1 % cream Pea size amount to face nightly 135 g 1  ? [DISCONTINUED] NIFEdipine (ADALAT CC) 30 MG 24 hr tablet Take 1 tablet (30 mg total) by mouth  daily. 60 tablet 0  ? ?No current facility-administered medications on file prior to visit.  ? ? ? ? ?Objective:  ?  ? ?Vitals:  ? 12/16/21 0803  ?BP: 122/85  ?Pulse: 85  ?  ?Filed Weights  ? 12/16/21 0803  ?Weight: 271 lb 6.4 oz (123.1 kg)  ? ?  ?         Physical examination ?General NAD, Conversant  ?HEENT Atraumatic; Op clear with mmm.  Normo-cephalic. Pupils reactive. Anicteric sclerae  ?Thyroid/Neck Smooth without nodularity or enlargement. Normal ROM.  Neck Supple.  ?Skin No rashes, lesions or ulceration. Normal palpated skin turgor. No nodularity.  ?Breasts: No masses or discharge.  Symmetric.  No axillary adenopathy.  ?Lungs: Clear to auscultation.No rales or wheezes. Normal Respiratory effort, no retractions.  ?Heart: NSR.  No murmurs or rubs appreciated. No periferal edema  ?Abdomen: Soft.  Non-tender.  No masses.  No HSM. No hernia  ?Extremities: Moves all appropriately.  Normal ROM for age. No lymphadenopathy.  ?Neuro: Oriented to PPT.  Normal mood. Normal affect.  ? ?  Pelvic:   ?Vulva: Normal appearance.  No lesions.  ?Vagina: No lesions or abnormalities noted.  ?Support: Normal pelvic support.  ?Urethra No masses tenderness or scarring.  ?Meatus Normal size without lesions or prolapse.  ?Cervix: Normal appearance.  No lesions.  IUD strings inside cervical os  ?Anus: Normal exam.  No lesions.  ?Perineum: Normal exam.  No lesions.  ?      Bimanual   ?Uterus: Normal size.  Non-tender.  Mobile.  AV.  ?Adnexae: No masses.  Non-tender to palpation.  ?Cul-de-sac: Negative for abnormality.  ? ? ? ?Assessment:  ?  ?G1P1001 ?Patient Active Problem List  ? Diagnosis Date Noted  ? Delay 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/07/2020  ? Vaginal vault hematoma 09/07/2020  ? Symptomatic anemia 09/07/2020  ? Maternal blood transfusion 09/07/2020  ? Obesity in pregnancy 09/07/2020  ? Mild pre-eclampsia in third trimester 09/05/2020  ? Pregnancy 08/22/2020  ? Asthma affecting pregnancy in third trimester 08/18/2020  ? Indication  for care in labor or delivery 08/14/2020  ? Labor and delivery, indication for care 07/19/2020  ? ?  ?1. Well woman exam with routine gynecological exam   ?2. Cervical cancer screening   ? ? The bumps patient is describing are most likely infected hair follicles.  We have discussed management. ? ? ?Plan:  ?  ?       ? 1.  Basic Screening Recommendations ?The basic screening recommendations for asymptomatic women were discussed with the patient during her visit.  The age-appropriate recommendations were discussed with her and the rational for the tests reviewed.  When I am informed by the patient that another primary care physician has previously obtained the age-appropriate tests and they are up-to-date, only outstanding tests are ordered and referrals given as necessary.  Abnormal results of tests will be discussed with her when all of her results are completed.  Routine preventative health maintenance measures emphasized: Exercise/Diet/Weight control, Tobacco Warnings, Alcohol/Substance use risks and Stress Management ?First Pap performed GC/CT. ? ?Orders ?No orders of the defined types were placed in this encounter. ? ? No orders of the defined types were placed in this encounter. ?    ?  ?  F/U ? Return in about 1  year (around 12/17/2022) for Annual Physical. ? ?Elonda Husky, M.D. ?12/16/2021 ?8:31 AM ? ? ? ?

## 2021-12-17 LAB — CYTOLOGY - PAP
Chlamydia: NEGATIVE
Comment: NEGATIVE
Comment: NORMAL
Diagnosis: NEGATIVE
Neisseria Gonorrhea: NEGATIVE

## 2021-12-31 ENCOUNTER — Encounter: Payer: Self-pay | Admitting: Nurse Practitioner

## 2022-01-01 DIAGNOSIS — R070 Pain in throat: Secondary | ICD-10-CM | POA: Diagnosis not present

## 2022-01-01 DIAGNOSIS — J020 Streptococcal pharyngitis: Secondary | ICD-10-CM | POA: Diagnosis not present

## 2022-01-01 DIAGNOSIS — J101 Influenza due to other identified influenza virus with other respiratory manifestations: Secondary | ICD-10-CM | POA: Diagnosis not present

## 2022-01-07 DIAGNOSIS — S46912A Strain of unspecified muscle, fascia and tendon at shoulder and upper arm level, left arm, initial encounter: Secondary | ICD-10-CM | POA: Diagnosis not present

## 2022-01-21 DIAGNOSIS — J069 Acute upper respiratory infection, unspecified: Secondary | ICD-10-CM | POA: Diagnosis not present

## 2022-01-21 DIAGNOSIS — Z20822 Contact with and (suspected) exposure to covid-19: Secondary | ICD-10-CM | POA: Diagnosis not present

## 2022-01-21 DIAGNOSIS — R070 Pain in throat: Secondary | ICD-10-CM | POA: Diagnosis not present

## 2022-01-27 DIAGNOSIS — R0981 Nasal congestion: Secondary | ICD-10-CM | POA: Diagnosis not present

## 2022-01-27 DIAGNOSIS — J329 Chronic sinusitis, unspecified: Secondary | ICD-10-CM | POA: Diagnosis not present

## 2022-01-27 DIAGNOSIS — J309 Allergic rhinitis, unspecified: Secondary | ICD-10-CM | POA: Diagnosis not present

## 2022-02-02 DIAGNOSIS — J301 Allergic rhinitis due to pollen: Secondary | ICD-10-CM | POA: Diagnosis not present

## 2022-02-10 DIAGNOSIS — J301 Allergic rhinitis due to pollen: Secondary | ICD-10-CM | POA: Diagnosis not present

## 2022-02-13 DIAGNOSIS — R059 Cough, unspecified: Secondary | ICD-10-CM | POA: Diagnosis not present

## 2022-02-13 DIAGNOSIS — Z20822 Contact with and (suspected) exposure to covid-19: Secondary | ICD-10-CM | POA: Diagnosis not present

## 2022-02-13 DIAGNOSIS — J029 Acute pharyngitis, unspecified: Secondary | ICD-10-CM | POA: Diagnosis not present

## 2022-02-13 DIAGNOSIS — R070 Pain in throat: Secondary | ICD-10-CM | POA: Diagnosis not present

## 2022-02-14 IMAGING — CR DG CHEST 2V
2 series · 2 of 2 positions shown · non-contrast
Comparison: Chest radiograph dated 01/31/2021.

CLINICAL DATA: Chest pain cough.

EXAM:
CHEST - 2 VIEW

[chest pa]
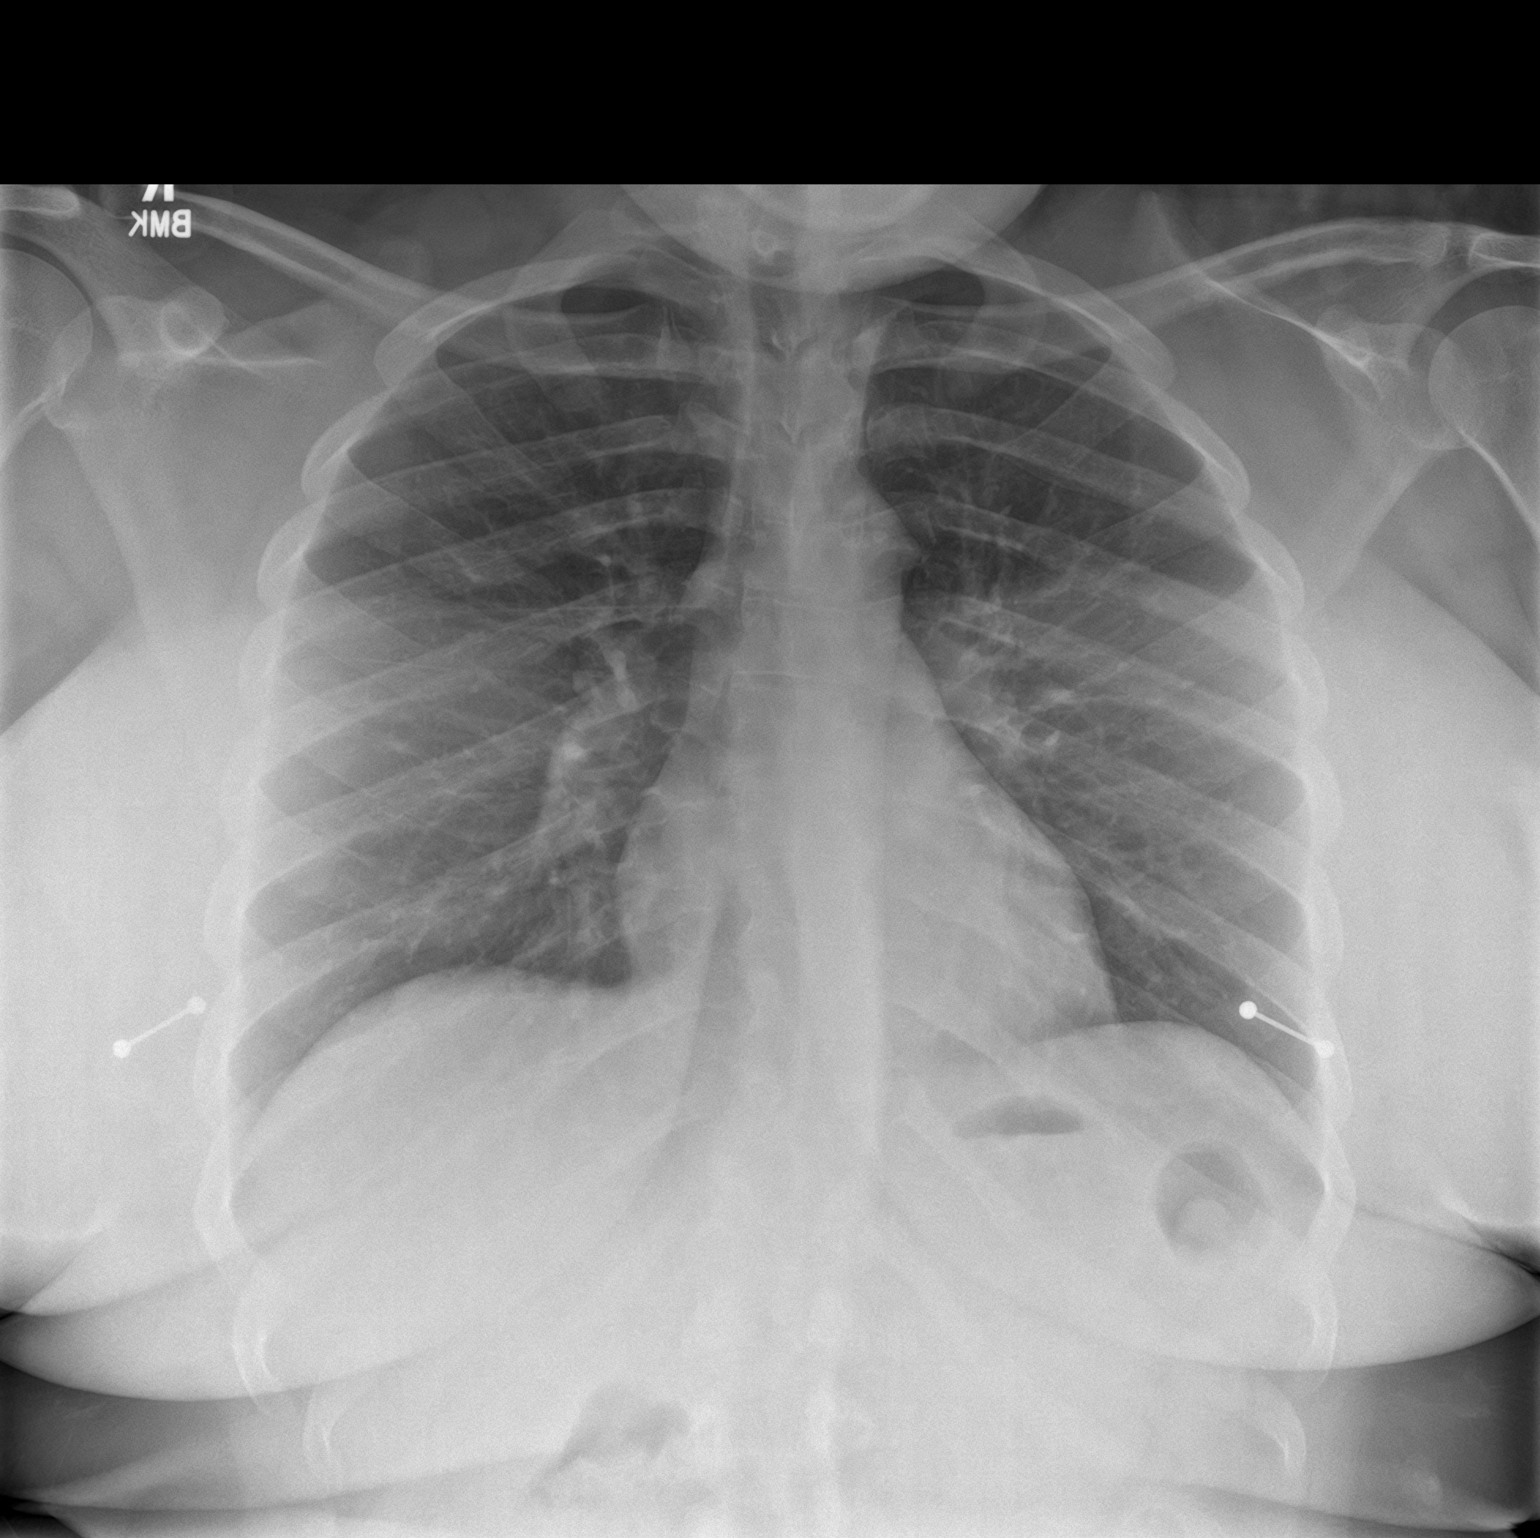

[chest lat]
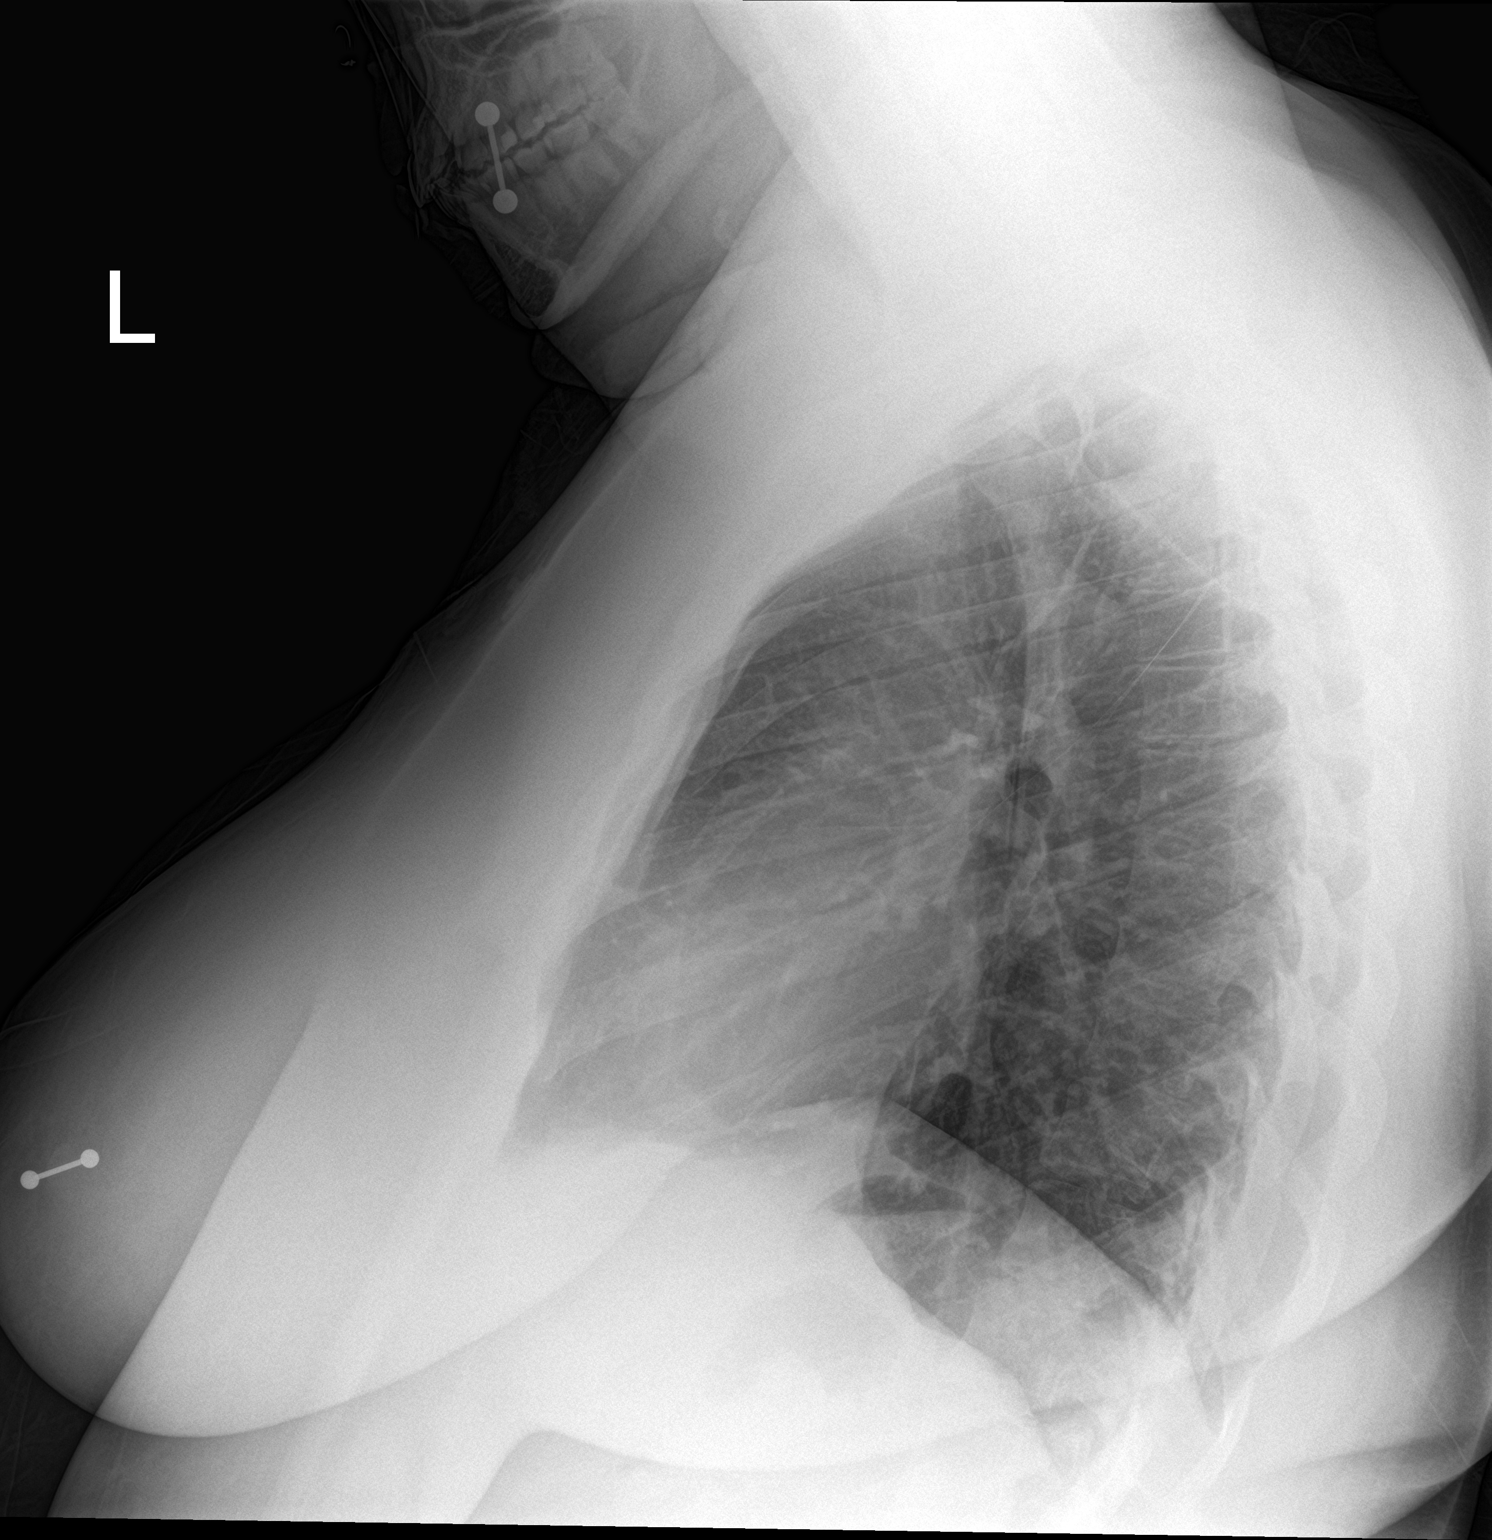

[2 of 2 positions shown; findings below may reference images not displayed]

FINDINGS: The heart size and mediastinal contours are within normal limits.
Both lungs are clear. The visualized skeletal structures are
unremarkable.
IMPRESSION: No active cardiopulmonary disease.

## 2022-02-14 IMAGING — CT CT ANGIO CHEST
2 of 6 series · 18 of 46 positions shown · IV contrast (APPLIED)
Comparison: 06/20/2021

CLINICAL DATA: Nonproductive cough, intermittent shortness of
breath, left-sided chest pain for 1 week

EXAM:
CT ANGIOGRAPHY CHEST WITH CONTRAST
TECHNIQUE: Multidetector CT imaging of the chest was performed using the
standard protocol during bolus administration of intravenous
contrast. Multiplanar CT image reconstructions and MIPs were
obtained to evaluate the vascular anatomy.
CONTRAST:  75mL OMNIPAQUE IOHEXOL 350 MG/ML SOLN

[Series 5: thins · axial · 0.69mm/px · z∈[-491,-244]mm · 15 of 338 slices shown]
[im 15/338  lung]
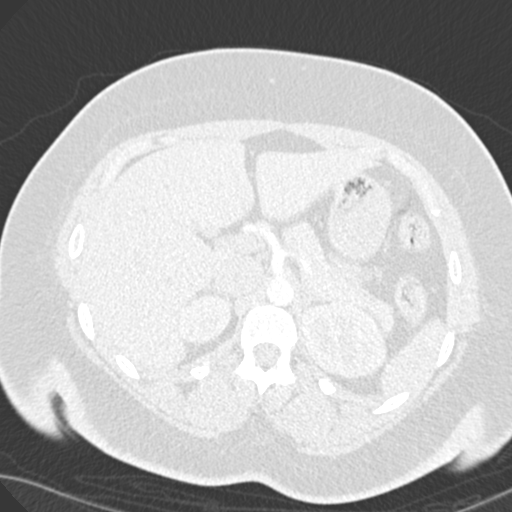
[im 43/338  soft-tissue]
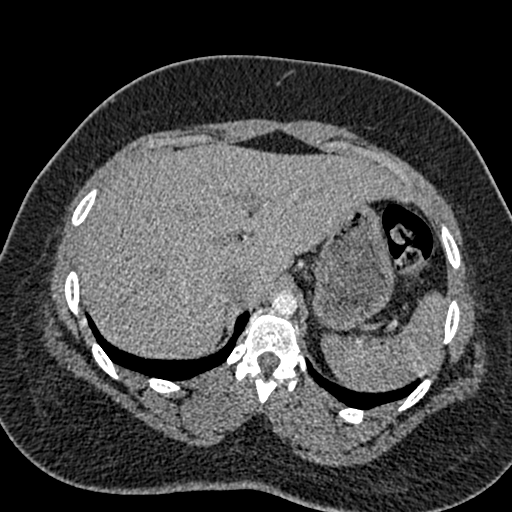
[im 57/338  lung]
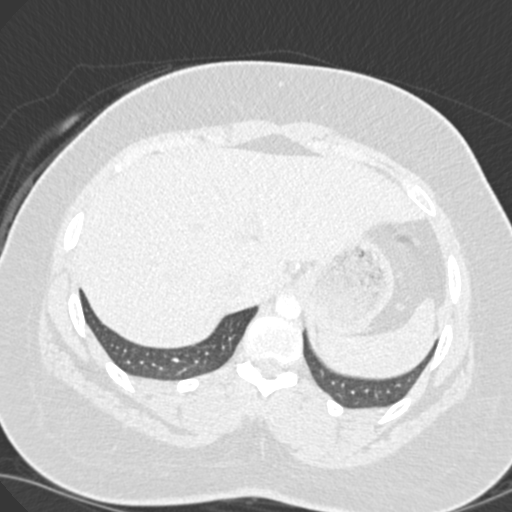
[im 85/338  soft-tissue]
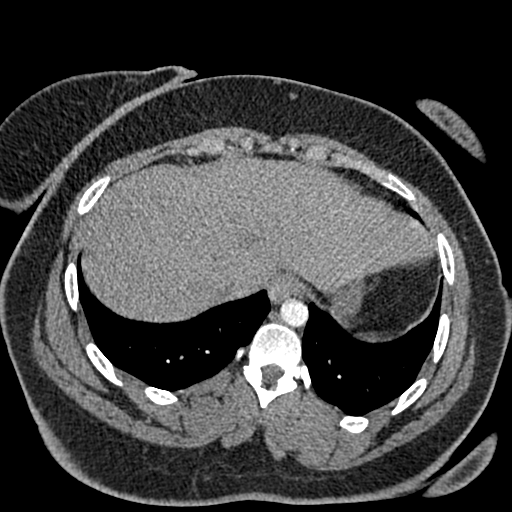
[im 99/338  lung]
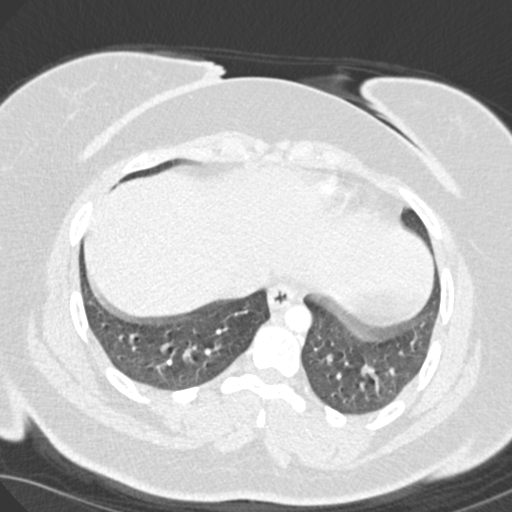
[im 127/338  soft-tissue]
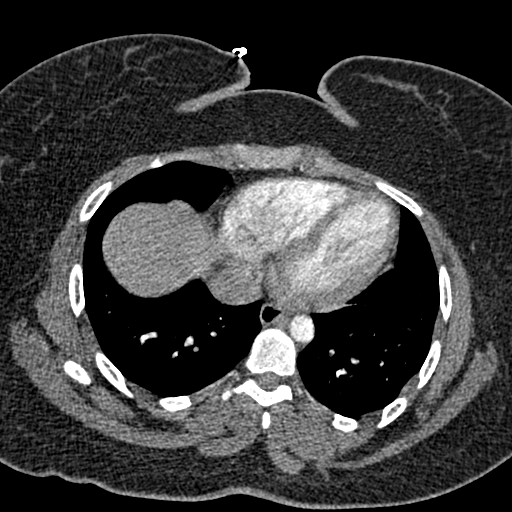
[im 141/338  lung]
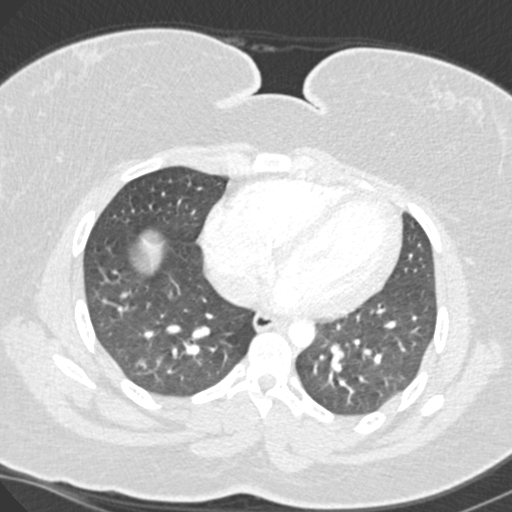
[im 169/338  soft-tissue]
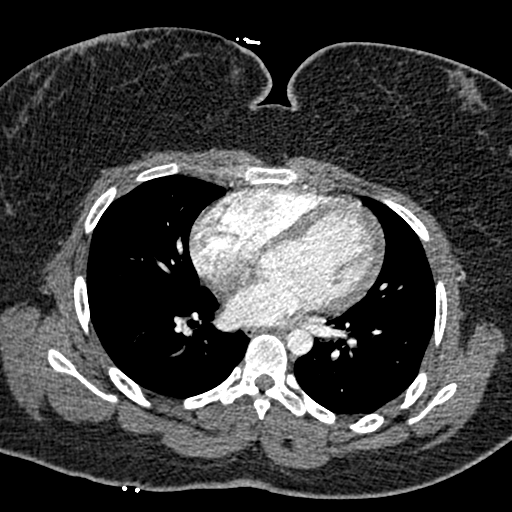
[im 197/338  lung]
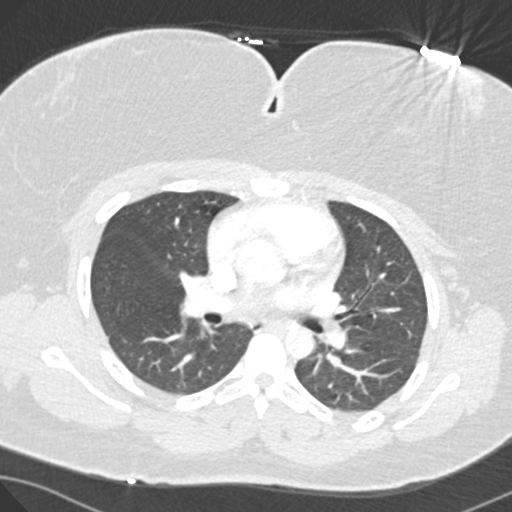
[im 211/338  soft-tissue]
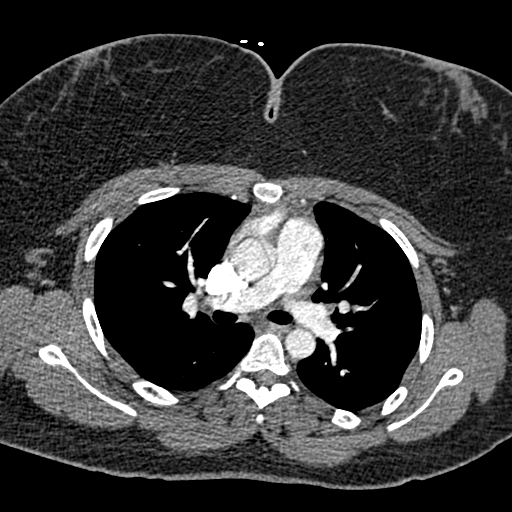
[im 239/338  lung]
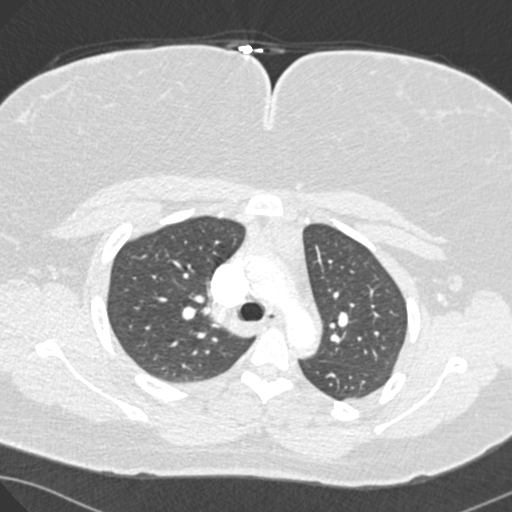
[im 253/338  soft-tissue]
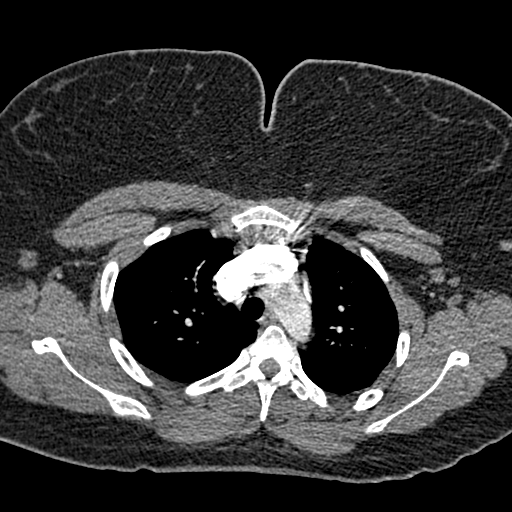
[im 281/338  lung]
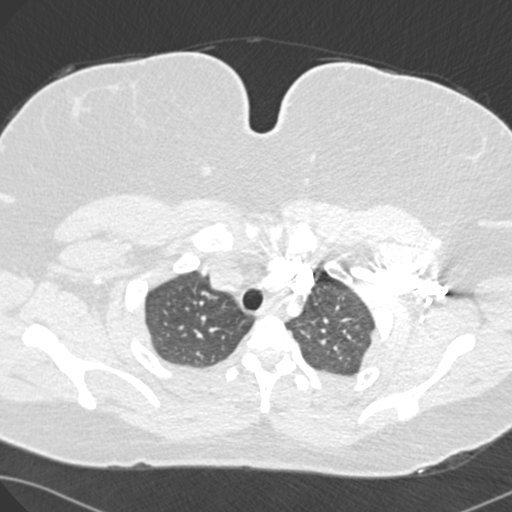
[im 295/338  soft-tissue]
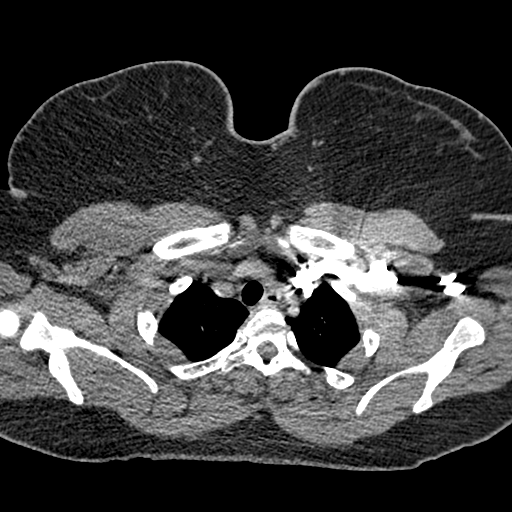
[im 323/338  lung]
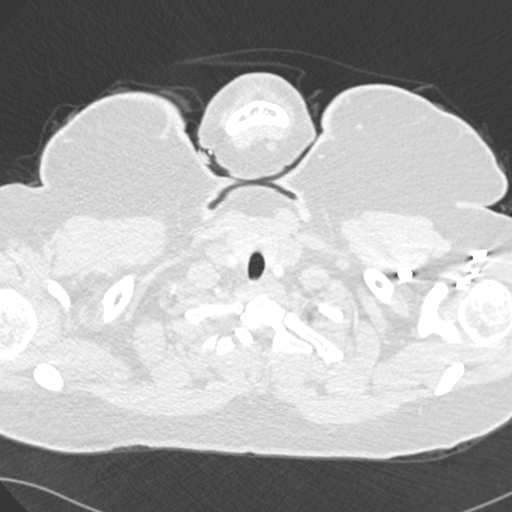

[Series 7: coronal mpr · coronal · 0.53mm/px · 3 of 85 slices shown]
[im 22/85  soft-tissue]
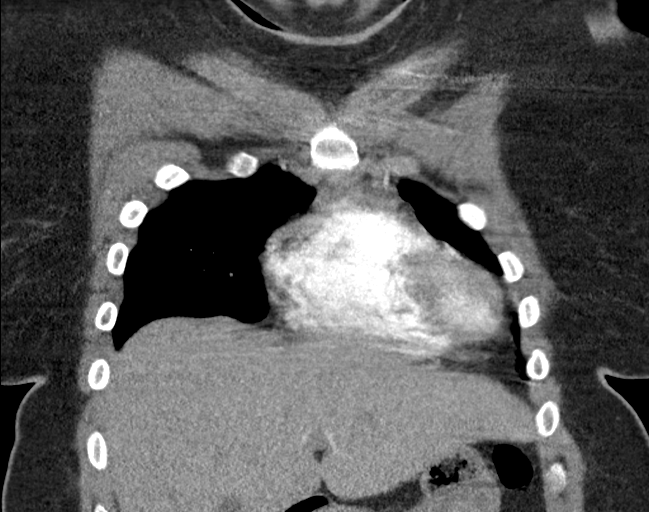
[im 43/85  soft-tissue]
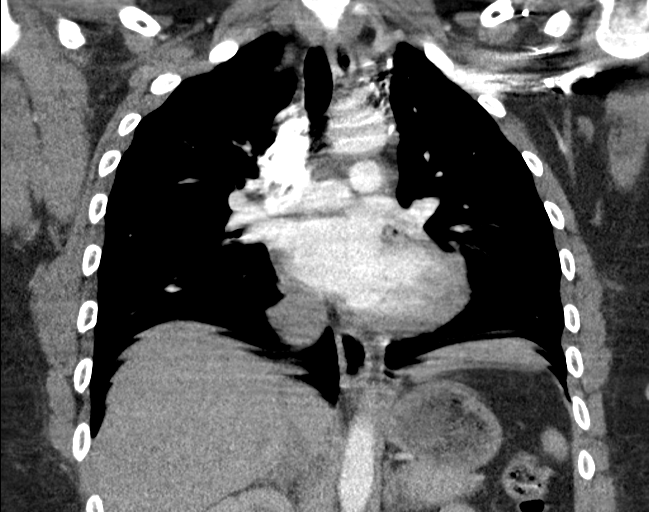
[im 64/85  soft-tissue]
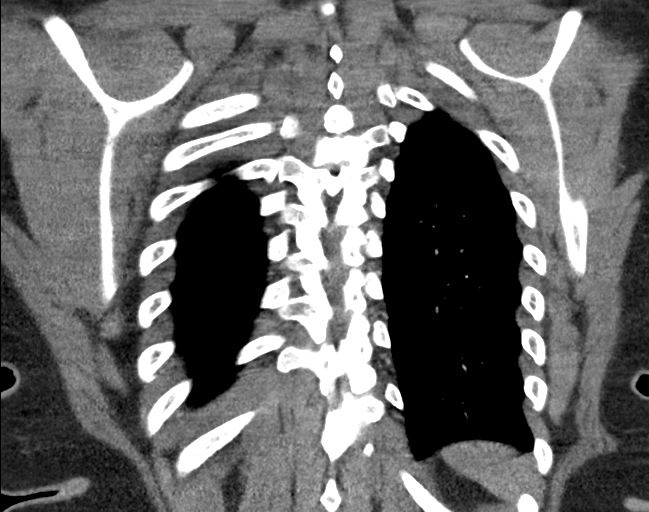

[18 of 46 positions shown; findings below may reference images not displayed]

FINDINGS: Cardiovascular: This is a technically adequate evaluation of the
pulmonary vasculature. No filling defects or pulmonary emboli.

The heart is unremarkable without pericardial effusion. No evidence
of thoracic aortic aneurysm or dissection.

Mediastinum/Nodes: No enlarged mediastinal, hilar, or axillary lymph
nodes. Thyroid gland, trachea, and esophagus demonstrate no
significant findings.

Lungs/Pleura: No airspace disease, effusion, or pneumothorax.
Central airways are widely patent.

Upper Abdomen: No acute abnormality.

Musculoskeletal: No acute or destructive bony lesions. Reconstructed
images demonstrate no additional findings.

Review of the MIP images confirms the above findings.
IMPRESSION: 1. No evidence of pulmonary embolus.
2. No acute intrathoracic process.

## 2022-02-21 DIAGNOSIS — J301 Allergic rhinitis due to pollen: Secondary | ICD-10-CM | POA: Diagnosis not present

## 2022-02-24 DIAGNOSIS — J301 Allergic rhinitis due to pollen: Secondary | ICD-10-CM | POA: Diagnosis not present

## 2022-02-28 DIAGNOSIS — J301 Allergic rhinitis due to pollen: Secondary | ICD-10-CM | POA: Diagnosis not present

## 2022-03-03 ENCOUNTER — Telehealth: Payer: Self-pay

## 2022-03-03 DIAGNOSIS — J301 Allergic rhinitis due to pollen: Secondary | ICD-10-CM | POA: Diagnosis not present

## 2022-03-03 NOTE — Telephone Encounter (Signed)
Left vm and sent mychart message to confirm 03/10/22 appointment-[name] 

## 2022-03-09 DIAGNOSIS — J301 Allergic rhinitis due to pollen: Secondary | ICD-10-CM | POA: Diagnosis not present

## 2022-03-10 ENCOUNTER — Encounter: Payer: Medicaid Other | Admitting: Nurse Practitioner

## 2022-03-10 DIAGNOSIS — J301 Allergic rhinitis due to pollen: Secondary | ICD-10-CM | POA: Diagnosis not present

## 2022-03-14 DIAGNOSIS — J301 Allergic rhinitis due to pollen: Secondary | ICD-10-CM | POA: Diagnosis not present

## 2022-03-16 ENCOUNTER — Other Ambulatory Visit: Payer: Self-pay | Admitting: Nurse Practitioner

## 2022-03-16 DIAGNOSIS — J4520 Mild intermittent asthma, uncomplicated: Secondary | ICD-10-CM

## 2022-03-17 DIAGNOSIS — J301 Allergic rhinitis due to pollen: Secondary | ICD-10-CM | POA: Diagnosis not present

## 2022-03-19 ENCOUNTER — Encounter: Payer: Self-pay | Admitting: Nurse Practitioner

## 2022-03-22 ENCOUNTER — Ambulatory Visit (INDEPENDENT_AMBULATORY_CARE_PROVIDER_SITE_OTHER): Payer: Medicaid Other | Admitting: Nurse Practitioner

## 2022-03-22 ENCOUNTER — Encounter: Payer: Self-pay | Admitting: Nurse Practitioner

## 2022-03-22 VITALS — BP 123/84 | HR 88 | Temp 98.6°F | Resp 16 | Ht 67.0 in | Wt 262.0 lb

## 2022-03-22 DIAGNOSIS — L089 Local infection of the skin and subcutaneous tissue, unspecified: Secondary | ICD-10-CM | POA: Diagnosis not present

## 2022-03-22 DIAGNOSIS — S91331A Puncture wound without foreign body, right foot, initial encounter: Secondary | ICD-10-CM | POA: Diagnosis not present

## 2022-03-22 DIAGNOSIS — B9689 Other specified bacterial agents as the cause of diseases classified elsewhere: Secondary | ICD-10-CM | POA: Diagnosis not present

## 2022-03-22 MED ORDER — TETANUS-DIPHTH-ACELL PERTUSSIS 5-2.5-18.5 LF-MCG/0.5 IM SUSP: 0.5000 mL | Freq: Once | INTRAMUSCULAR | 0 refills | Status: AC

## 2022-03-22 MED ORDER — MUPIROCIN 2 % EX OINT: 1.0000 "application " | TOPICAL_OINTMENT | Freq: Every day | CUTANEOUS | 0 refills | Status: DC

## 2022-03-22 NOTE — Progress Notes (Signed)
[facility] [place], [place] [address]  Internal MEDICINE  Office Visit Note  Patient Name: Sandy Burns  [id number]  [id number]  Date of Service: 03/22/2022  Chief Complaint  Patient presents with   Acute Visit    Stepped on rusty nail with right foot      HPI Sandy Burns presents for an acute sick visit for injury from nail to right foot.  She slipped and fell on a wooden deck and scraped the plantar aspect of her right foot on a rusted old nail.      Current Medication:  Outpatient Encounter Medications as of 03/22/2022  Medication Sig Note   ADVAIR DISKUS 100-50 MCG/ACT AEPB INHALE 1 PUFF INTO THE LUNGS TWICE DAILY    albuterol (VENTOLIN HFA) 108 (90 Base) MCG/ACT inhaler Inhale 2 puffs into the lungs every 4 (four) hours as needed for wheezing or shortness of breath.    cetirizine (ZYRTEC) 5 MG tablet Take 1 tablet (5 mg total) by mouth daily.    EPIPEN 2-PAK 0.3 MG/0.3ML SOAJ injection use as directed by prescriber if needed 05/20/2015: Received from: External Pharmacy   fluticasone (FLONASE) 50 MCG/ACT nasal spray Place 1 spray into both nostrils daily.    montelukast (SINGULAIR) 10 MG tablet TAKE 1 TABLET(10 MG) BY MOUTH AT BEDTIME    Multiple Vitamin (MULTIVITAMIN PO) Take by mouth.    mupirocin ointment (BACTROBAN) 2 % Apply 1 application  topically daily. To affected area of right foot until healed.    Tdap (BOOSTRIX) 5-2.5-18.5 LF-MCG/0.5 injection Inject 0.5 mLs into the muscle once for 1 dose.    tretinoin (RETIN-A) 0.1 % cream Pea size amount to face nightly    [DISCONTINUED] NIFEdipine (ADALAT CC) 30 MG 24 hr tablet Take 1 tablet (30 mg total) by mouth daily.    No facility-administered encounter medications on file as of 03/22/2022.      Medical History: Past Medical History:  Diagnosis Date   Anemia    Asthma    Eczema    Environmental allergies    History of blood transfusion    after giving birth 09/06/2020     Vital  Signs: BP 123/84   Pulse 88   Temp 98.6 F (37 C)   Resp 16   Ht 5\' 7"  (1.702 m)   Wt 262 lb (118.8 kg)   SpO2 98%   BMI 41.04 kg/m    Review of Systems  Constitutional: Negative.   Respiratory: Negative.  Negative for cough, chest tightness, shortness of breath and wheezing.   Cardiovascular: Negative.  Negative for chest pain and palpitations.  Gastrointestinal: Negative.   Musculoskeletal: Negative.   Skin:  Positive for wound (Superficial laceration of the plantar aspect of the right foot.).  Neurological: Negative.   Psychiatric/Behavioral: Negative.      Physical Exam Vitals reviewed.  Constitutional:      General: She is not in acute distress.    Appearance: Normal appearance. She is obese. She is not ill-appearing.  HENT:     Head: Normocephalic and atraumatic.  Eyes:     Pupils: Pupils are equal, round, and reactive to light.  Cardiovascular:     Rate and Rhythm: Normal rate and regular rhythm.  Pulmonary:     Effort: Pulmonary effort is normal. No respiratory distress.  Neurological:     Mental Status: She is alert and oriented to person, place, and time.  Psychiatric:        Mood and Affect: Mood normal.  Behavior: Behavior normal.       Assessment/Plan: 1. Localized bacterial infection of skin Mupirocin ointment prescribed to apply once daily until healed to treat any developing infection and prevent any further skin infection. A piece of loose skin was removed, antibiotic ointment applied followed by an adhesive bandage - mupirocin ointment (BACTROBAN) 2 %; Apply 1 application  topically daily. To affected area of right foot until healed.  Dispense: 30 g; Refill: 0  2. Nail wound of foot, right, initial encounter Patient has not had a tetanus vaccine in more than 10 years or the booster so Tdap vaccine was prescribed and sent to the pharmacy - Tdap ([place]) 5-2.5-18.5 LF-MCG/0.5 injection; Inject 0.5 mLs into the muscle once for 1 dose.   Dispense: 0.5 mL; Refill: 0   General Counseling: Sandy Burns verbalizes understanding of the findings of todays visit and agrees with plan of treatment. I have discussed any further diagnostic evaluation that may be needed or ordered today. We also reviewed her medications today. she has been encouraged to call the office with any questions or concerns that should arise related to todays visit.    Counseling:    No orders of the defined types were placed in this encounter.   Meds ordered this encounter  Medications   Tdap (BOOSTRIX) 5-2.5-18.5 LF-MCG/0.5 injection    Sig: Inject 0.5 mLs into the muscle once for 1 dose.    Dispense:  0.5 mL    Refill:  0   mupirocin ointment (BACTROBAN) 2 %    Sig: Apply 1 application  topically daily. To affected area of right foot until healed.    Dispense:  30 g    Refill:  0    Return if symptoms worsen or fail to improve.  [place] Controlled Substance Database was reviewed by me for overdose risk score (ORS)  Time spent:20 Minutes Time spent with patient included reviewing progress notes, labs, imaging studies, and discussing plan for follow up.   This patient was seen by Jonetta Osgood, FNP-C in collaboration with Dr. Clayborn Bigness as a part of collaborative care agreement.  Sandy Saidi R. Valetta Fuller, MSN, FNP-C Internal Medicine

## 2022-03-24 DIAGNOSIS — J301 Allergic rhinitis due to pollen: Secondary | ICD-10-CM | POA: Diagnosis not present

## 2022-03-31 DIAGNOSIS — J301 Allergic rhinitis due to pollen: Secondary | ICD-10-CM | POA: Diagnosis not present

## 2022-04-04 DIAGNOSIS — J301 Allergic rhinitis due to pollen: Secondary | ICD-10-CM | POA: Diagnosis not present

## 2022-04-06 DIAGNOSIS — J301 Allergic rhinitis due to pollen: Secondary | ICD-10-CM | POA: Diagnosis not present

## 2022-04-07 DIAGNOSIS — J301 Allergic rhinitis due to pollen: Secondary | ICD-10-CM | POA: Diagnosis not present

## 2022-04-09 DIAGNOSIS — R070 Pain in throat: Secondary | ICD-10-CM | POA: Diagnosis not present

## 2022-04-09 DIAGNOSIS — J029 Acute pharyngitis, unspecified: Secondary | ICD-10-CM | POA: Diagnosis not present

## 2022-04-16 DIAGNOSIS — J020 Streptococcal pharyngitis: Secondary | ICD-10-CM | POA: Diagnosis not present

## 2022-04-18 DIAGNOSIS — J301 Allergic rhinitis due to pollen: Secondary | ICD-10-CM | POA: Diagnosis not present

## 2022-04-19 ENCOUNTER — Other Ambulatory Visit: Payer: Self-pay | Admitting: Nurse Practitioner

## 2022-04-19 DIAGNOSIS — J3089 Other allergic rhinitis: Secondary | ICD-10-CM

## 2022-04-25 DIAGNOSIS — J301 Allergic rhinitis due to pollen: Secondary | ICD-10-CM | POA: Diagnosis not present

## 2022-04-26 ENCOUNTER — Encounter: Payer: Self-pay | Admitting: Nurse Practitioner

## 2022-04-26 NOTE — Telephone Encounter (Signed)
Patient needs to go to ED

## 2022-04-27 ENCOUNTER — Other Ambulatory Visit: Payer: Self-pay

## 2022-04-27 ENCOUNTER — Telehealth: Payer: Self-pay

## 2022-04-27 ENCOUNTER — Emergency Department
Admission: EM | Admit: 2022-04-27 | Discharge: 2022-04-28 | Disposition: A | Discharge status: Home / Self Care | Payer: Medicaid Other | Attending: Emergency Medicine | Admitting: Emergency Medicine

## 2022-04-27 ENCOUNTER — Emergency Department: Payer: Medicaid Other

## 2022-04-27 DIAGNOSIS — R0602 Shortness of breath: Secondary | ICD-10-CM | POA: Diagnosis not present

## 2022-04-27 DIAGNOSIS — J45901 Unspecified asthma with (acute) exacerbation: Secondary | ICD-10-CM | POA: Diagnosis not present

## 2022-04-27 DIAGNOSIS — R0600 Dyspnea, unspecified: Secondary | ICD-10-CM | POA: Diagnosis not present

## 2022-04-27 DIAGNOSIS — R519 Headache, unspecified: Secondary | ICD-10-CM | POA: Insufficient documentation

## 2022-04-27 DIAGNOSIS — H538 Other visual disturbances: Secondary | ICD-10-CM | POA: Diagnosis not present

## 2022-04-27 LAB — CBC
HCT: 36.1 % (ref 36.0–46.0)
Hemoglobin: 11.5 g/dL — ABNORMAL LOW (ref 12.0–15.0)
MCH: 28.8 pg (ref 26.0–34.0)
MCHC: 31.9 g/dL (ref 30.0–36.0)
MCV: 90.5 fL (ref 80.0–100.0)
Platelets: 307 10*3/uL (ref 150–400)
RBC: 3.99 MIL/uL (ref 3.87–5.11)
RDW: 13.2 % (ref 11.5–15.5)
WBC: 6.5 10*3/uL (ref 4.0–10.5)
nRBC: 0 % (ref 0.0–0.2)

## 2022-04-27 LAB — BASIC METABOLIC PANEL
Anion gap: 6 (ref 5–15)
BUN: 13 mg/dL (ref 6–20)
CO2: 27 mmol/L (ref 22–32)
Calcium: 9 mg/dL (ref 8.9–10.3)
Chloride: 104 mmol/L (ref 98–111)
Creatinine, Ser: 0.72 mg/dL (ref 0.44–1.00)
GFR, Estimated: >60 mL/min (ref >60)
Glucose, Bld: 118 mg/dL — ABNORMAL HIGH (ref 70–99)
Potassium: 3.5 mmol/L (ref 3.5–5.1)
Sodium: 137 mmol/L (ref 135–145)

## 2022-04-27 LAB — POC URINE PREG, ED: Preg Test, Ur: NEGATIVE

## 2022-04-27 NOTE — Telephone Encounter (Signed)
Try to call pt regarding she send mychart message last night as per dr Welton Flakes send message back she need go to ED Asap

## 2022-04-27 NOTE — Telephone Encounter (Signed)
Spoke with pt that her symptoms gets worse for blurry vision and breathing getting worse go to ED she said breathing is not that bad I gave her appt tomorrow advised her as per dfk twice if her symptoms gets need go to ED

## 2022-04-27 NOTE — ED Triage Notes (Signed)
Pt presents to ER c/o a few days of increased breathing difficulty.  Pt also endorses some increased blurry vision and HA.  Pt states she has recently got over strep throat and has recently finished a dose of Augmentin.  Pt endorses hx of asthma and seasonal allergies and has been using her inhaler with some relief.  Pt is A&O x4 at this time in NAD in triage.

## 2022-04-27 NOTE — ED Notes (Signed)
Rainbow sent to the lab.  

## 2022-04-28 ENCOUNTER — Ambulatory Visit: Payer: Medicaid Other | Admitting: Physician Assistant

## 2022-04-28 NOTE — ED Provider Notes (Signed)
[facility] Provider Note    Event Date/Time   First MD Initiated Contact with Patient 04/27/22 2348     (approximate)   History   Shortness of Breath   HPI  Sandy Burns is a 21 y.o. female whose past medical history includes asthma as well as obesity.  She presents for evaluation of shortness of breath.  She said that she feels like she is had increased work of breathing over the last couple of days.  Sometimes she has had a little bit of blurry vision and headache as well but she just recently was treated for strep throat with Augmentin and is feeling much better from that.  She has eczema and seasonal allergies as well as the asthma and she said that her inhaler helps a little bit.  She called her regular doctor and the person on the phone told her she should come to the emergency department.  She says she currently feels well and is not feeling short of breath.  No chest pain.  No history of blood clots in the legs of the lungs.  No recent travel or immobilizations.     Physical Exam   Triage Vital Signs: ED Triage Vitals  Enc Vitals Group     BP 04/27/22 2102 122/86     Pulse Rate 04/27/22 2102 (!) 102     Resp 04/27/22 2102 20     Temp 04/27/22 2102 98.3 F (36.8 C)     Temp Source 04/27/22 2102 Oral     SpO2 04/27/22 2102 99 %     Weight 04/27/22 2102 124.7 kg (275 lb)     Height 04/27/22 2102 1.702 m (5\' 7" )     Head Circumference --      Peak Flow --      Pain Score 04/27/22 2118 7     Pain Loc --      Pain Edu? --      Excl. in GC? --     Most recent vital signs: Vitals:   04/27/22 2102  BP: 122/86  Pulse: (!) 102  Resp: 20  Temp: 98.3 F (36.8 C)  SpO2: 99%     General: Awake, no distress.  CV:  Good peripheral perfusion.  Initially was slightly tachycardic at triage but heart rate is now in the 80s. Resp:  Normal effort.  Lungs are clear to auscultation bilaterally.  No wheezing at this time. Abd:  No distention.   Other:  No neurological deficits.  No difficulty speaking.  No distress currently.   ED Results / Procedures / Treatments   Labs (all labs ordered are listed, but only abnormal results are displayed) Labs Reviewed  CBC - Abnormal; Notable for the following components:      Result Value   Hemoglobin 11.5 (*)    All other components within normal limits  BASIC METABOLIC PANEL - Abnormal; Notable for the following components:   Glucose, Bld 118 (*)    All other components within normal limits  POC URINE PREG, ED  POC URINE PREG, ED     EKG  ED ECG REPORT I, 04/29/22, the attending physician, personally viewed and interpreted this ECG.  Date: 04/27/2022 EKG Time: 21: 06 Rate: 100 Rhythm: Borderline tachycardia QRS Axis: normal Intervals: normal ST/T Wave abnormalities: Non-specific ST segment / T-wave changes, but no clear evidence of acute ischemia. Narrative Interpretation: no definitive evidence of acute ischemia; does not meet STEMI criteria.    RADIOLOGY I  viewed and interpreted the patient's two-view chest x-ray and I see no evidence of pneumonia, pneumothorax, or other acute abnormality.  I also read the radiologist's report, which confirmed no acute findings.    PROCEDURES:  Critical Care performed: No  Procedures   MEDICATIONS ORDERED IN ED: Medications - No data to display   IMPRESSION / MDM / ASSESSMENT AND PLAN / ED COURSE  I reviewed the triage vital signs and the nursing notes.                              Differential diagnosis includes, but is not limited to, asthma exacerbation, pneumonia, pneumothorax, PE.  Patient's presentation is most consistent with acute presentation with potential threat to life or bodily function.  However her examination has been reassuring.  She is not currently short of breath.  Her vital signs are essentially normal; when she first checked in in triage she was borderline tachycardic but that has since  resolved and her heart rate is in the 80s during my exam.  She is in no distress and feels no dyspnea currently.  Lungs are clear.  She said that the symptoms feel mostly like when she is having a little bit of asthma and she was not going to come in here except that the nurse at her primary care provider's office told her she should when she does describe being short of breath.  As documented above, her chest x-ray is normal.  No sign of ischemia on EKG.  Labs ordered include BMP, urine pregnancy test, and CBC.  All of these tests are within normal limits (negative pregnancy).  Overall reassuring exam, PERC negative now the tachycardia resolved without intervention.  Suspect mild asthma exacerbation.  She is due for allergy shots later today, and given that she is not having any wheezing or evidence of airway obstruction at this time, I will not give her Decadron or other steroids for fear of interfering with her allergy injections.  We talked about increasing the use of her inhaler and she will follow-up with her regular provider.  No indication for advanced imaging.  No indication for admission.  I gave my usual and customary return precautions.       FINAL CLINICAL IMPRESSION(S) / ED DIAGNOSES   Final diagnoses:  Mild asthma exacerbation     Rx / DC Orders   ED Discharge Orders     None        Note:  This document was prepared using Dragon voice recognition software and may include unintentional dictation errors.   Loleta Rose, MD 04/28/22 [phone]

## 2022-04-28 NOTE — Discharge Instructions (Addendum)
As we discussed, your evaluation was reassuring tonight, and you may be having a mild asthma exacerbation along with allergies and your recent strep throat treatment.  Please follow-up with your regular doctor.  We encourage you to try using 4 puffs on your inhaler if you find the 2 is not enough.  You can discuss additional testing with your primary care provider.    Return to the emergency department if you develop new or worsening symptoms that concern you.

## 2022-04-29 ENCOUNTER — Telehealth: Payer: Self-pay

## 2022-04-29 NOTE — Telephone Encounter (Signed)
Left vm and sent mychart message to confirm 05/05/22 appointment-[name]

## 2022-05-02 DIAGNOSIS — J301 Allergic rhinitis due to pollen: Secondary | ICD-10-CM | POA: Diagnosis not present

## 2022-05-05 ENCOUNTER — Encounter: Payer: Medicaid Other | Admitting: Nurse Practitioner

## 2022-05-12 ENCOUNTER — Ambulatory Visit (INDEPENDENT_AMBULATORY_CARE_PROVIDER_SITE_OTHER): Payer: Medicaid Other | Admitting: Nurse Practitioner

## 2022-05-12 ENCOUNTER — Encounter: Payer: Self-pay | Admitting: Nurse Practitioner

## 2022-05-12 VITALS — BP 109/77 | HR 100 | Temp 98.7°F | Resp 16 | Ht 67.0 in | Wt 262.0 lb

## 2022-05-12 DIAGNOSIS — J3089 Other allergic rhinitis: Secondary | ICD-10-CM | POA: Diagnosis not present

## 2022-05-12 DIAGNOSIS — R7301 Impaired fasting glucose: Secondary | ICD-10-CM

## 2022-05-12 DIAGNOSIS — R300 Dysuria: Secondary | ICD-10-CM

## 2022-05-12 DIAGNOSIS — E782 Mixed hyperlipidemia: Secondary | ICD-10-CM | POA: Diagnosis not present

## 2022-05-12 DIAGNOSIS — Z0001 Encounter for general adult medical examination with abnormal findings: Secondary | ICD-10-CM

## 2022-05-12 DIAGNOSIS — J4520 Mild intermittent asthma, uncomplicated: Secondary | ICD-10-CM

## 2022-05-12 DIAGNOSIS — D649 Anemia, unspecified: Secondary | ICD-10-CM | POA: Diagnosis not present

## 2022-05-12 DIAGNOSIS — E559 Vitamin D deficiency, unspecified: Secondary | ICD-10-CM | POA: Diagnosis not present

## 2022-05-12 MED ORDER — EPIPEN 2-PAK 0.3 MG/0.3ML IJ SOAJ: 0.3000 mg | INTRAMUSCULAR | 0 refills | Status: AC | PRN

## 2022-05-12 MED ORDER — MONTELUKAST SODIUM 10 MG PO TABS: ORAL_TABLET | ORAL | 3 refills | Status: DC

## 2022-05-12 MED ORDER — ALBUTEROL SULFATE HFA 108 (90 BASE) MCG/ACT IN AERS
2.0000 | INHALATION_SPRAY | RESPIRATORY_TRACT | 5 refills | Status: DC | PRN
Start: 2022-05-12 — End: 2022-09-15

## 2022-05-12 MED ORDER — CETIRIZINE HCL 5 MG PO TABS: 5.0000 mg | ORAL_TABLET | Freq: Every day | ORAL | 1 refills | Status: DC

## 2022-05-12 MED ORDER — FLUTICASONE-SALMETEROL 100-50 MCG/ACT IN AEPB: INHALATION_SPRAY | RESPIRATORY_TRACT | 11 refills | Status: DC

## 2022-05-12 MED ORDER — FLUTICASONE PROPIONATE 50 MCG/ACT NA SUSP: NASAL | 5 refills | Status: AC

## 2022-05-12 NOTE — Progress Notes (Signed)
[facility] [address] [place], [place] [address]  Internal MEDICINE  Office Visit Note  Patient Name: Sandy Burns  [id number]  [id number]  Date of Service: 05/12/2022  Chief Complaint  Patient presents with   Annual Exam   Anemia   Asthma   Weight Loss    HPI Sandy Burns presents for an annual well visit and physical exam.  --well appearing 21 yo female with history of anemia and mild intermittent asthma.  BP and other vital signs are normal.  Due for lab Pap smear was done in march this year.  Needs med refills.      Current Medication: Outpatient Encounter Medications as of 05/12/2022  Medication Sig Note   Multiple Vitamin (MULTIVITAMIN PO) Take by mouth.    tretinoin (RETIN-A) 0.1 % cream Pea size amount to face nightly    [DISCONTINUED] ADVAIR DISKUS 100-50 MCG/ACT AEPB INHALE 1 PUFF INTO THE LUNGS TWICE DAILY    [DISCONTINUED] albuterol (VENTOLIN HFA) 108 (90 Base) MCG/ACT inhaler Inhale 2 puffs into the lungs every 4 (four) hours as needed for wheezing or shortness of breath.    [DISCONTINUED] cetirizine (ZYRTEC) 5 MG tablet Take 1 tablet (5 mg total) by mouth daily.    [DISCONTINUED] EPIPEN 2-PAK 0.3 MG/0.3ML SOAJ injection use as directed by prescriber if needed 05/20/2015: Received from: External Pharmacy   [DISCONTINUED] fluticasone (FLONASE) 50 MCG/ACT nasal spray SHAKE LIQUID AND USE 1 SPRAY IN EACH NOSTRIL DAILY    [DISCONTINUED] montelukast (SINGULAIR) 10 MG tablet TAKE 1 TABLET(10 MG) BY MOUTH AT BEDTIME    [DISCONTINUED] mupirocin ointment (BACTROBAN) 2 % Apply 1 application  topically daily. To affected area of right foot until healed.    albuterol (VENTOLIN HFA) 108 (90 Base) MCG/ACT inhaler Inhale 2 puffs into the lungs every 4 (four) hours as needed for wheezing or shortness of breath.    cetirizine (ZYRTEC) 5 MG tablet Take 1 tablet (5 mg total) by mouth daily.    EPIPEN 2-PAK 0.3 MG/0.3ML SOAJ injection Inject 0.3 mg into the muscle as needed  for anaphylaxis.    fluticasone (FLONASE) 50 MCG/ACT nasal spray SHAKE LIQUID AND USE 1 SPRAY IN EACH NOSTRIL DAILY    fluticasone-salmeterol (ADVAIR DISKUS) 100-50 MCG/ACT AEPB INHALE 1 PUFF INTO THE LUNGS TWICE DAILY    montelukast (SINGULAIR) 10 MG tablet TAKE 1 TABLET(10 MG) BY MOUTH AT BEDTIME    [DISCONTINUED] NIFEdipine (ADALAT CC) 30 MG 24 hr tablet Take 1 tablet (30 mg total) by mouth daily.    No facility-administered encounter medications on file as of 05/12/2022.    Surgical History: Past Surgical History:  Procedure Laterality Date   FOOT SURGERY  2015   REPAIR VAGINAL CUFF N/A 09/06/2020   Procedure: REPAIR VAGINAL CUFF;  Surgeon: Hildred Laser, MD;  Location: [facility] ORS;  Service: Gynecology;  Laterality: N/A;   TOOTH EXTRACTION  2015    Medical History: Past Medical History:  Diagnosis Date   Anemia    Asthma    Eczema    Environmental allergies    History of blood transfusion    after giving birth 09/06/2020    Family History: Family History  Problem Relation Age of Onset   Rheum arthritis Mother    Hypertension Mother    Lupus Mother    Schizophrenia Father     Social History   Socioeconomic History   Marital status: Single    Spouse name: Not on file   Number of children: Not on file  Years of education: Not on file   Highest education level: Not on file  Occupational History   Not on file  Tobacco Use   Smoking status: Never   Smokeless tobacco: Never  Vaping Use   Vaping Use: Never used  Substance and Sexual Activity   Alcohol use: Never    Alcohol/week: 0.0 standard drinks of alcohol   Drug use: Never   Sexual activity: Not Currently    Birth control/protection: I.U.D.  Other Topics Concern   Not on file  Social History Narrative   Not on file   Social Determinants of Health   Financial Resource Strain: Not on file  Food Insecurity: Not on file  Transportation Needs: Not on file  Physical Activity: Not on file  Stress: Not on  file  Social Connections: Not on file  Intimate Partner Violence: Not on file      Review of Systems  Constitutional:  Negative for chills, diaphoresis and fatigue.  HENT:  Positive for congestion. Negative for ear pain, postnasal drip and sinus pressure.   Eyes:  Negative for photophobia, discharge, redness, itching and visual disturbance.  Respiratory:  Negative for cough, shortness of breath and wheezing.   Cardiovascular:  Negative for chest pain, palpitations and leg swelling.  Gastrointestinal:  Negative for abdominal pain, constipation, diarrhea, nausea and vomiting.  Genitourinary:  Negative for dysuria and flank pain.  Musculoskeletal:  Negative for arthralgias, back pain, gait problem and neck pain.  Skin:  Negative for color change.  Allergic/Immunologic: Negative for environmental allergies and food allergies.  Neurological:  Negative for dizziness and headaches.  Hematological:  Does not bruise/bleed easily.  Psychiatric/Behavioral:  Negative for agitation, behavioral problems (depression) and hallucinations.     Vital Signs: BP 109/77   Pulse 100   Temp 98.7 F (37.1 C)   Resp 16   Ht $R'5\' 7"'ky$  (1.702 m)   Wt 262 lb (118.8 kg)   SpO2 98%   BMI 41.04 kg/m    Physical Exam Vitals reviewed.  Constitutional:      General: She is not in acute distress.    Appearance: Normal appearance. She is well-developed. She is obese. She is not ill-appearing or diaphoretic.  HENT:     Head: Normocephalic and atraumatic.     Right Ear: Tympanic membrane, ear canal and external ear normal.     Left Ear: Tympanic membrane, ear canal and external ear normal.     Nose: Nose normal. No congestion or rhinorrhea.     Mouth/Throat:     Mouth: Mucous membranes are moist.     Pharynx: Oropharynx is clear. No oropharyngeal exudate or posterior oropharyngeal erythema.  Eyes:     Extraocular Movements: Extraocular movements intact.     Conjunctiva/sclera: Conjunctivae normal.      Pupils: Pupils are equal, round, and reactive to light.  Neck:     Thyroid: No thyromegaly.     Vascular: No JVD.     Trachea: No tracheal deviation.  Cardiovascular:     Rate and Rhythm: Normal rate and regular rhythm.     Pulses: Normal pulses.     Heart sounds: Normal heart sounds. No murmur [facility]) heard.    No friction rub. No gallop.  Pulmonary:     Effort: Pulmonary effort is normal. No respiratory distress.     Breath sounds: Normal breath sounds. No wheezing or rales.  Chest:     Chest wall: No tenderness.  Abdominal:     General: Bowel sounds  are normal.     Palpations: Abdomen is soft.  Musculoskeletal:        General: Normal range of motion.     Cervical back: Normal range of motion and neck supple.  Lymphadenopathy:     Cervical: No cervical adenopathy.  Skin:    General: Skin is warm and dry.     Capillary Refill: Capillary refill takes less than 2 seconds.  Neurological:     Mental Status: She is alert and oriented to person, place, and time.     Cranial Nerves: No cranial nerve deficit.  Psychiatric:        Mood and Affect: Mood normal.        Behavior: Behavior normal.        Thought Content: Thought content normal.        Judgment: Judgment normal.        Assessment/Plan: 1. Encounter for general adult medical examination with abnormal findings Age-appropriate preventive screenings and vaccinations discussed, annual physical exam completed. Routine labs for health maintenance ordered, see below. PHM updated. Pap smear done in march this year.  - EPIPEN 2-PAK 0.3 MG/0.3ML SOAJ injection; Inject 0.3 mg into the muscle as needed for anaphylaxis.  Dispense: 1 each; Refill: 0  2. Non-seasonal allergic rhinitis due to other allergic trigger Controlled with current medications - fluticasone (FLONASE) 50 MCG/ACT nasal spray; SHAKE LIQUID AND USE 1 SPRAY IN EACH NOSTRIL DAILY  Dispense: 16 g; Refill: 5 - cetirizine (ZYRTEC) 5 MG tablet; Take 1 tablet (5 mg  total) by mouth daily.  Dispense: 90 tablet; Refill: 1  3. Mild intermittent asthma without complication Stable, no significant events, no issues  - montelukast (SINGULAIR) 10 MG tablet; TAKE 1 TABLET(10 MG) BY MOUTH AT BEDTIME  Dispense: 90 tablet; Refill: 3 - fluticasone-salmeterol (ADVAIR DISKUS) 100-50 MCG/ACT AEPB; INHALE 1 PUFF INTO THE LUNGS TWICE DAILY  Dispense: 60 each; Refill: 11 - albuterol (VENTOLIN HFA) 108 (90 Base) MCG/ACT inhaler; Inhale 2 puffs into the lungs every 4 (four) hours as needed for wheezing or shortness of breath.  Dispense: 8 g; Refill: 5  4. Impaired fasting glucose Screen for diabetes - CMP14+EGFR - Hgb A1C w/o eAG  5. Mixed hyperlipidemia Repeat lipid panel to reassess cholesterol levels. - Lipid Profile  6. Anemia, unspecified type Hx of anemia, labs ordered - CBC with Differential/Platelet - B12 and Folate Panel - Iron, TIBC and Ferritin Panel  7. Vitamin D deficiency - Vitamin D (25 hydroxy)  8. Dysuria Routine urinalysis done - UA/M w/rflx Culture, Routine - Microscopic Examination     General Counseling: Desirae verbalizes understanding of the findings of todays visit and agrees with plan of treatment. I have discussed any further diagnostic evaluation that may be needed or ordered today. We also reviewed her medications today. she has been encouraged to call the office with any questions or concerns that should arise related to todays visit.    Orders Placed This Encounter  Procedures   UA/M w/rflx Culture, Routine   CBC with Differential/Platelet   Lipid Profile   CMP14+EGFR   B12 and Folate Panel   Iron, TIBC and Ferritin Panel   Vitamin D (25 hydroxy)   Hgb A1C w/o eAG    Meds ordered this encounter  Medications   montelukast (SINGULAIR) 10 MG tablet    Sig: TAKE 1 TABLET(10 MG) BY MOUTH AT BEDTIME    Dispense:  90 tablet    Refill:  3   fluticasone (FLONASE) 50 MCG/ACT nasal  spray    Sig: SHAKE LIQUID AND USE 1 SPRAY  IN EACH NOSTRIL DAILY    Dispense:  16 g    Refill:  5   EPIPEN 2-PAK 0.3 MG/0.3ML SOAJ injection    Sig: Inject 0.3 mg into the muscle as needed for anaphylaxis.    Dispense:  1 each    Refill:  0   cetirizine (ZYRTEC) 5 MG tablet    Sig: Take 1 tablet (5 mg total) by mouth daily.    Dispense:  90 tablet    Refill:  1   fluticasone-salmeterol (ADVAIR DISKUS) 100-50 MCG/ACT AEPB    Sig: INHALE 1 PUFF INTO THE LUNGS TWICE DAILY    Dispense:  60 each    Refill:  11   albuterol (VENTOLIN HFA) 108 (90 Base) MCG/ACT inhaler    Sig: Inhale 2 puffs into the lungs every 4 (four) hours as needed for wheezing or shortness of breath.    Dispense:  8 g    Refill:  5    Return in about 3 weeks (around 06/02/2022) for F/U, Labs, Breeanna Galgano PCP and metabolic test. .   Total time spent:30 Minutes Time spent includes review of chart, medications, test results, and follow up plan with the patient.   [place] Controlled Substance Database was reviewed by me.  This patient was seen by Jonetta Osgood, FNP-C in collaboration with Dr. Clayborn Bigness as a part of collaborative care agreement.  Haile Bosler R. Valetta Fuller, MSN, FNP-C Internal medicine

## 2022-05-13 LAB — UA/M W/RFLX CULTURE, ROUTINE
Bilirubin, UA: NEGATIVE
Glucose, UA: NEGATIVE
Leukocytes,UA: NEGATIVE
Nitrite, UA: NEGATIVE
Protein,UA: NEGATIVE
Specific Gravity, UA: 1.025 (ref 1.005–1.030)
Urobilinogen, Ur: 0.2 mg/dL (ref 0.2–1.0)
pH, UA: 6 (ref 5.0–7.5)

## 2022-05-13 LAB — MICROSCOPIC EXAMINATION
Bacteria, UA: NONE SEEN
Casts: NONE SEEN /lpf
RBC, Urine: NONE SEEN /hpf (ref 0–2)

## 2022-05-17 ENCOUNTER — Ambulatory Visit (INDEPENDENT_AMBULATORY_CARE_PROVIDER_SITE_OTHER): Payer: Medicaid Other | Admitting: Dermatology

## 2022-05-17 ENCOUNTER — Encounter: Payer: Self-pay | Admitting: Dermatology

## 2022-05-17 DIAGNOSIS — L2081 Atopic neurodermatitis: Secondary | ICD-10-CM

## 2022-05-17 DIAGNOSIS — L906 Striae atrophicae: Secondary | ICD-10-CM

## 2022-05-17 DIAGNOSIS — L700 Acne vulgaris: Secondary | ICD-10-CM

## 2022-05-17 DIAGNOSIS — D649 Anemia, unspecified: Secondary | ICD-10-CM | POA: Diagnosis not present

## 2022-05-17 DIAGNOSIS — E782 Mixed hyperlipidemia: Secondary | ICD-10-CM | POA: Diagnosis not present

## 2022-05-17 DIAGNOSIS — L918 Other hypertrophic disorders of the skin: Secondary | ICD-10-CM

## 2022-05-17 DIAGNOSIS — E559 Vitamin D deficiency, unspecified: Secondary | ICD-10-CM | POA: Diagnosis not present

## 2022-05-17 DIAGNOSIS — R7301 Impaired fasting glucose: Secondary | ICD-10-CM | POA: Diagnosis not present

## 2022-05-17 MED ORDER — OPZELURA 1.5 % EX CREA: TOPICAL_CREAM | CUTANEOUS | 2 refills | Status: DC

## 2022-05-17 NOTE — Progress Notes (Signed)
   Follow-Up Visit   Subjective  Sandy Burns is a 21 y.o. female who presents for the following: Eczema (6 months f/u on eczema on the body, treating with Eucrisa ointment with a fair response). Acne on face treating with Retin A 0.1% cream and Benzaclin gel with a good response. The patient has spots, moles and lesions to be evaluated, some may be new or changing and the patient has concerns that these could be cancer.  The following portions of the chart were reviewed this encounter and updated as appropriate:   Tobacco  Allergies  Meds  Problems  Med Hx  Surg Hx  Fam Hx     Review of Systems:  No other skin or systemic complaints except as noted in HPI or Assessment and Plan.  Objective  Well appearing patient in no apparent distress; mood and affect are within normal limits.  A focused examination was performed including arms,legs,face. Relevant physical exam findings are noted in the Assessment and Plan.  antecubital areas, legs, face Patchy roughness scale to at legs   antecubial area Stretch marks   face Few closed comedones on the face    Assessment & Plan  Atopic neurodermatitis antecubital areas, legs, face Chronic and persistent condition with duration or expected duration over one year. Condition is symptomatic / bothersome to patient. Not to goal. Arms doing OK.  Legs persistent and resistant.  Atopic dermatitis (eczema) is a chronic, relapsing, pruritic condition that can significantly affect quality of life. It is often associated with allergic rhinitis and/or asthma and can require treatment with topical medications, phototherapy, or in severe cases biologic injectable medication (Dupixent; Adbry) or Oral JAK inhibitors.   Continue Eucrisa ointment daily  Start Opzelura cream apply to legs once a day  Related Medications Ruxolitinib Phosphate (OPZELURA) 1.5 % CREA Apply to legs once a day  Striae antecubial area avoid steroid cream   Acne  vulgaris Face Improved and doing well ; stable  Continue Benzaclin gel apply to face in the am Continue Retin A 0.1% cream apply to face at bedtime  Related Medications tretinoin (RETIN-A) 0.1 % cream Pea size amount to face nightly  Acrochordons (Skin Tags) - Fleshy, skin-colored pedunculated papules - Benign appearing.  - Observe. - If desired, they can be removed with an in office procedure that is not covered by insurance. - Please call the clinic if you notice any new or changing lesions.   Return in about 6 months (around 11/17/2022) for Eczema, acne .  IAngelique Holm, CMA, am acting as scribe for Armida Sans, MD .  Documentation: I have reviewed the above documentation for accuracy and completeness, and I agree with the above.  Armida Sans, MD

## 2022-05-17 NOTE — Patient Instructions (Signed)
Due to recent changes in healthcare laws, you may see results of your pathology and/or laboratory studies on MyChart before the doctors have had a chance to review them. We understand that in some cases there may be results that are confusing or concerning to you. Please understand that not all results are received at the same time and often the doctors may need to interpret multiple results in order to provide you with the best plan of care or course of treatment. Therefore, we ask that you please give us 2 business days to thoroughly review all your results before contacting the office for clarification. Should we see a critical lab result, you will be contacted sooner.   If You Need Anything After Your Visit  If you have any questions or concerns for your doctor, please call our main line at [phone] and press option 4 to reach your doctor's medical assistant. If no one answers, please leave a voicemail as directed and we will return your call as soon as possible. Messages left after 4 pm will be answered the following business day.   You may also send us a message via MyChart. We typically respond to MyChart messages within 1-2 business days.  For prescription refills, please ask your pharmacy to contact our office. Our fax number is [phone].  If you have an urgent issue when the clinic is closed that cannot wait until the next business day, you can page your doctor at the number below.    Please note that while we do our best to be available for urgent issues outside of office hours, we are not available 24/7.   If you have an urgent issue and are unable to reach us, you may choose to seek medical care at your doctor's office, retail clinic, urgent care center, or emergency room.  If you have a medical emergency, please immediately call 911 or go to the emergency department.  Pager Numbers  - Dr. Kowalski: [phone]  - Dr. Moye: [phone]  - Dr. Stewart:  [phone]  In the event of inclement weather, please call our main line at [phone] for an update on the status of any delays or closures.  Dermatology Medication Tips: Please keep the boxes that topical medications come in in order to help keep track of the instructions about where and how to use these. Pharmacies typically print the medication instructions only on the boxes and not directly on the medication tubes.   If your medication is too expensive, please contact our office at [phone] option 4 or send us a message through MyChart.   We are unable to tell what your co-pay for medications will be in advance as this is different depending on your insurance coverage. However, we may be able to find a substitute medication at lower cost or fill out paperwork to get insurance to cover a needed medication.   If a prior authorization is required to get your medication covered by your insurance company, please allow us 1-2 business days to complete this process.  Drug prices often vary depending on where the prescription is filled and some pharmacies may offer cheaper prices.  The website www.goodrx.com contains coupons for medications through different pharmacies. The prices here do not account for what the cost may be with help from insurance (it may be cheaper with your insurance), but the website can give you the price if you did not use any insurance.  - You can print the associated coupon and take it with   your prescription to the pharmacy.  - You may also stop by our office during regular business hours and pick up a GoodRx coupon card.  - If you need your prescription sent electronically to a different pharmacy, notify our office through [facility] MyChart or by phone at [phone] option 4.     Si Usted Necesita Algo Despus de Su Visita  Tambin puede enviarnos un mensaje a travs de MyChart. Por lo general respondemos a los mensajes de MyChart en el transcurso de 1 a 2  das hbiles.  Para renovar recetas, por favor pida a su farmacia que se ponga en contacto con nuestra oficina. Nuestro nmero de fax es el [phone].  Si tiene un asunto urgente cuando la clnica est cerrada y que no puede esperar hasta el siguiente da hbil, puede llamar/localizar a su doctor(a) al nmero que aparece a continuacin.   Por favor, tenga en cuenta que aunque hacemos todo lo posible para estar disponibles para asuntos urgentes fuera del horario de oficina, no estamos disponibles las 24 horas del da, los 7 das de la semana.   Si tiene un problema urgente y no puede comunicarse con nosotros, puede optar por buscar atencin mdica  en el consultorio de su doctor(a), en una clnica privada, en un centro de atencin urgente o en una sala de emergencias.  Si tiene una emergencia mdica, por favor llame inmediatamente al 911 o vaya a la sala de emergencias.  Nmeros de bper  - Dr. Kowalski: [phone]  - Dra. Moye: [phone]  - Dra. Stewart: [phone]  En caso de inclemencias del tiempo, por favor llame a nuestra lnea principal al [phone] para una actualizacin sobre el estado de cualquier retraso o cierre.  Consejos para la medicacin en dermatologa: Por favor, guarde las cajas en las que vienen los medicamentos de uso tpico para ayudarle a seguir las instrucciones sobre dnde y cmo usarlos. Las farmacias generalmente imprimen las instrucciones del medicamento slo en las cajas y no directamente en los tubos del medicamento.   Si su medicamento es muy caro, por favor, pngase en contacto con nuestra oficina llamando al [phone] y presione la opcin 4 o envenos un mensaje a travs de MyChart.   No podemos decirle cul ser su copago por los medicamentos por adelantado ya que esto es diferente dependiendo de la cobertura de su seguro. Sin embargo, es posible que podamos encontrar un medicamento sustituto a menor costo o llenar un formulario para que el  seguro cubra el medicamento que se considera necesario.   Si se requiere una autorizacin previa para que su compaa de seguros cubra su medicamento, por favor permtanos de 1 a 2 das hbiles para completar este proceso.  Los precios de los medicamentos varan con frecuencia dependiendo del lugar de dnde se surte la receta y alguna farmacias pueden ofrecer precios ms baratos.  El sitio web www.goodrx.com tiene cupones para medicamentos de diferentes farmacias. Los precios aqu no tienen en cuenta lo que podra costar con la ayuda del seguro (puede ser ms barato con su seguro), pero el sitio web puede darle el precio si no utiliz ningn seguro.  - Puede imprimir el cupn correspondiente y llevarlo con su receta a la farmacia.  - Tambin puede pasar por nuestra oficina durante el horario de atencin regular y recoger una tarjeta de cupones de GoodRx.  - Si necesita que su receta se enve electrnicamente a una farmacia diferente, informe a nuestra oficina a travs de MyChart de [place]   o por telfono llamando al [phone] y presione la opcin 4.  

## 2022-05-18 LAB — IRON,TIBC AND FERRITIN PANEL
Ferritin: 93 ng/mL (ref 15–150)
Iron Saturation: 28 % (ref 15–55)
Iron: 90 ug/dL (ref 27–159)
Total Iron Binding Capacity: 321 ug/dL (ref 250–450)
UIBC: 231 ug/dL (ref 131–425)

## 2022-05-18 LAB — LIPID PANEL
Chol/HDL Ratio: 3.7 ratio (ref 0.0–4.4)
Cholesterol, Total: 202 mg/dL — ABNORMAL HIGH (ref 100–199)
HDL: 54 mg/dL (ref >39)
LDL Chol Calc (NIH): 140 mg/dL — ABNORMAL HIGH (ref 0–99)
Triglycerides: 43 mg/dL (ref 0–149)
VLDL Cholesterol Cal: 8 mg/dL (ref 5–40)

## 2022-05-18 LAB — CMP14+EGFR
ALT: 11 IU/L (ref 0–32)
AST: 15 IU/L (ref 0–40)
Albumin/Globulin Ratio: 1.3 (ref 1.2–2.2)
Albumin: 4.2 g/dL (ref 4.0–5.0)
Alkaline Phosphatase: 94 IU/L (ref 44–121)
BUN/Creatinine Ratio: 16 (ref 9–23)
BUN: 12 mg/dL (ref 6–20)
Bilirubin Total: 0.3 mg/dL (ref 0.0–1.2)
CO2: 23 mmol/L (ref 20–29)
Calcium: 9.5 mg/dL (ref 8.7–10.2)
Chloride: 100 mmol/L (ref 96–106)
Creatinine, Ser: 0.74 mg/dL (ref 0.57–1.00)
Globulin, Total: 3.2 g/dL (ref 1.5–4.5)
Glucose: 77 mg/dL (ref 70–99)
Potassium: 4.6 mmol/L (ref 3.5–5.2)
Sodium: 138 mmol/L (ref 134–144)
Total Protein: 7.4 g/dL (ref 6.0–8.5)
eGFR: 118 mL/min/{1.73_m2} (ref >59)

## 2022-05-18 LAB — CBC WITH DIFFERENTIAL/PLATELET
Basophils Absolute: 0 10*3/uL (ref 0.0–0.2)
Basos: 1 %
EOS (ABSOLUTE): 0.1 10*3/uL (ref 0.0–0.4)
Eos: 4 %
Hematocrit: 39.2 % (ref 34.0–46.6)
Hemoglobin: 12.6 g/dL (ref 11.1–15.9)
Immature Grans (Abs): 0 10*3/uL (ref 0.0–0.1)
Immature Granulocytes: 0 %
Lymphocytes Absolute: 1.8 10*3/uL (ref 0.7–3.1)
Lymphs: 46 %
MCH: 28.6 pg (ref 26.6–33.0)
MCHC: 32.1 g/dL (ref 31.5–35.7)
MCV: 89 fL (ref 79–97)
Monocytes Absolute: 0.4 10*3/uL (ref 0.1–0.9)
Monocytes: 9 %
Neutrophils Absolute: 1.6 10*3/uL (ref 1.4–7.0)
Neutrophils: 40 %
Platelets: 303 10*3/uL (ref 150–450)
RBC: 4.41 x10E6/uL (ref 3.77–5.28)
RDW: 12.9 % (ref 11.7–15.4)
WBC: 3.9 10*3/uL (ref 3.4–10.8)

## 2022-05-18 LAB — HGB A1C W/O EAG: Hgb A1c MFr Bld: 5.8 % — ABNORMAL HIGH (ref 4.8–5.6)

## 2022-05-18 LAB — VITAMIN D 25 HYDROXY (VIT D DEFICIENCY, FRACTURES): Vit D, 25-Hydroxy: 21 ng/mL — ABNORMAL LOW (ref 30.0–100.0)

## 2022-05-18 LAB — B12 AND FOLATE PANEL
Folate: 12.6 ng/mL (ref >3.0)
Vitamin B-12: 722 pg/mL (ref 232–1245)

## 2022-05-19 DIAGNOSIS — J301 Allergic rhinitis due to pollen: Secondary | ICD-10-CM | POA: Diagnosis not present

## 2022-05-31 DIAGNOSIS — J069 Acute upper respiratory infection, unspecified: Secondary | ICD-10-CM | POA: Diagnosis not present

## 2022-06-07 ENCOUNTER — Encounter: Payer: Self-pay | Admitting: Nurse Practitioner

## 2022-06-07 ENCOUNTER — Ambulatory Visit: Payer: Medicaid Other | Admitting: Nurse Practitioner

## 2022-06-07 VITALS — BP 114/72 | HR 81 | Temp 97.8°F | Resp 16 | Ht 67.0 in | Wt 261.2 lb

## 2022-06-07 DIAGNOSIS — E559 Vitamin D deficiency, unspecified: Secondary | ICD-10-CM

## 2022-06-07 DIAGNOSIS — E782 Mixed hyperlipidemia: Secondary | ICD-10-CM

## 2022-06-07 DIAGNOSIS — R7303 Prediabetes: Secondary | ICD-10-CM

## 2022-06-07 DIAGNOSIS — Z6841 Body Mass Index (BMI) 40.0 and over, adult: Secondary | ICD-10-CM

## 2022-06-07 MED ORDER — METFORMIN HCL ER 500 MG PO TB24: 500.0000 mg | ORAL_TABLET | Freq: Every day | ORAL | 2 refills | Status: DC

## 2022-06-07 MED ORDER — VITAMIN D (ERGOCALCIFEROL) 1.25 MG (50000 UNIT) PO CAPS: 50000.0000 [IU] | ORAL_CAPSULE | ORAL | 1 refills | Status: DC

## 2022-06-07 NOTE — Progress Notes (Signed)
[facility] [address] [place], [place] [address]  Internal MEDICINE  Office Visit Note  Patient Name: Sandy Burns  [id number]  [id number]  Date of Service: 06/07/2022  Chief Complaint  Patient presents with  . Weight Loss    Metabolic test and review labs    HPI Sandy Burns presents for a follow up visit for weight loss management, metabolic test and to review lab results: --low vitamin D at 21.0 --A1c 5.8--prediabetes --cholesterol still elevated but has improved some, LDL 140 --all other labs are normal --metabolic test done today, results discussed  Metabolic Test Ordered [id number]) Resting Energy Expenditure: 2146,  Estimated Total Energy Output: 3011,  Target Caloric Intake Per Day for Weight Loss: 1718-2146,  When Compared to Normals Metabolism is considered slightly faster than normal.   Current Medication: Outpatient Encounter Medications as of 06/07/2022  Medication Sig  . albuterol (VENTOLIN HFA) 108 (90 Base) MCG/ACT inhaler Inhale 2 puffs into the lungs every 4 (four) hours as needed for wheezing or shortness of breath.  . cetirizine (ZYRTEC) 5 MG tablet Take 1 tablet (5 mg total) by mouth daily.  Marland Kitchen EPIPEN 2-PAK 0.3 MG/0.3ML SOAJ injection Inject 0.3 mg into the muscle as needed for anaphylaxis.  . fluticasone (FLONASE) 50 MCG/ACT nasal spray SHAKE LIQUID AND USE 1 SPRAY IN EACH NOSTRIL DAILY  . fluticasone-salmeterol (ADVAIR DISKUS) 100-50 MCG/ACT AEPB INHALE 1 PUFF INTO THE LUNGS TWICE DAILY  . metFORMIN (GLUCOPHAGE-XR) 500 MG 24 hr tablet Take 1 tablet (500 mg total) by mouth daily with breakfast.  . montelukast (SINGULAIR) 10 MG tablet TAKE 1 TABLET(10 MG) BY MOUTH AT BEDTIME  . Multiple Vitamin (MULTIVITAMIN PO) Take by mouth.  . Ruxolitinib Phosphate (OPZELURA) 1.5 % CREA Apply to legs once a day  . tretinoin (RETIN-A) 0.1 % cream Pea size amount to face nightly  . Vitamin D, Ergocalciferol, (DRISDOL) 1.25 MG (50000 UNIT) CAPS capsule Take 1 capsule  (50,000 Units total) by mouth every 7 (seven) days.  . [DISCONTINUED] NIFEdipine (ADALAT CC) 30 MG 24 hr tablet Take 1 tablet (30 mg total) by mouth daily.   No facility-administered encounter medications on file as of 06/07/2022.    Surgical History: Past Surgical History:  Procedure Laterality Date  . FOOT SURGERY  2015  . REPAIR VAGINAL CUFF N/A 09/06/2020   Procedure: REPAIR VAGINAL CUFF;  Surgeon: Hildred Laser, MD;  Location: [facility] ORS;  Service: Gynecology;  Laterality: N/A;  . TOOTH EXTRACTION  2015    Medical History: Past Medical History:  Diagnosis Date  . Anemia   . Asthma   . Eczema   . Environmental allergies   . History of blood transfusion    after giving birth 09/06/2020    Family History: Family History  Problem Relation Age of Onset  . Rheum arthritis Mother   . Hypertension Mother   . Lupus Mother   . Schizophrenia Father     Social History   Socioeconomic History  . Marital status: Single    Spouse name: Not on file  . Number of children: Not on file  . Years of education: Not on file  . Highest education level: Not on file  Occupational History  . Not on file  Tobacco Use  . Smoking status: Never  . Smokeless tobacco: Never  Vaping Use  . Vaping Use: Never used  Substance and Sexual Activity  . Alcohol use: Never    Alcohol/week: 0.0 standard drinks of alcohol  . Drug use: Never  .  Sexual activity: Not Currently    Birth control/protection: I.U.D.  Other Topics Concern  . Not on file  Social History Narrative  . Not on file   Social Determinants of Health   Financial Resource Strain: Not on file  Food Insecurity: Not on file  Transportation Needs: Not on file  Physical Activity: Not on file  Stress: Not on file  Social Connections: Not on file  Intimate Partner Violence: Not on file      Review of Systems  Constitutional:  Negative for chills, fatigue and unexpected weight change.  HENT:  Negative for congestion,  rhinorrhea, sneezing and sore throat.   Eyes:  Negative for redness.  Respiratory: Negative.  Negative for cough, chest tightness, shortness of breath and wheezing.   Cardiovascular: Negative.  Negative for chest pain and palpitations.  Gastrointestinal:  Negative for abdominal pain, constipation, diarrhea, nausea and vomiting.  Genitourinary:  Negative for dysuria and frequency.  Musculoskeletal:  Negative for arthralgias, back pain, joint swelling and neck pain.  Skin:  Negative for rash.  Neurological: Negative.  Negative for tremors and numbness.  Hematological:  Negative for adenopathy. Does not bruise/bleed easily.  Psychiatric/Behavioral:  Negative for behavioral problems (Depression), sleep disturbance and suicidal ideas. The patient is not nervous/anxious.     Vital Signs: BP 114/72   Pulse 81   Temp 97.8 F (36.6 C)   Resp 16   Ht 5\' 7"  (1.702 m)   Wt 261 lb 3.2 oz (118.5 kg)   SpO2 98%   BMI 40.91 kg/m    Physical Exam Vitals reviewed.  Constitutional:      General: She is not in acute distress.    Appearance: Normal appearance. She is obese. She is not ill-appearing.  HENT:     Head: Normocephalic and atraumatic.  Eyes:     Pupils: Pupils are equal, round, and reactive to light.  Cardiovascular:     Rate and Rhythm: Normal rate and regular rhythm.  Pulmonary:     Effort: Pulmonary effort is normal. No respiratory distress.  Neurological:     Mental Status: She is alert and oriented to person, place, and time.  Psychiatric:        Mood and Affect: Mood normal.        Behavior: Behavior normal.       Assessment/Plan: 1. Prediabetes Start metformin once daily with largest meal of the day, may eat with supper since patient does not eat breakfast.  - metFORMIN (GLUCOPHAGE-XR) 500 MG 24 hr tablet; Take 1 tablet (500 mg total) by mouth daily with breakfast.  Dispense: 30 tablet; Refill: 2  2. Vitamin D deficiency Prescription vitamin D weekly supplement  prescribed, repeat level in 6 months.  - Vitamin D, Ergocalciferol, (DRISDOL) 1.25 MG (50000 UNIT) CAPS capsule; Take 1 capsule (50,000 Units total) by mouth every 7 (seven) days.  Dispense: 12 capsule; Refill: 1  3. Mixed hyperlipidemia Abnormal cholesterol but improved from previous labs. Cholesterol levels will continue to improve with weight loss, increased physical activity and keeping the prediabetes under control and preventing progression into type 2 diabetes.   4. BMI 40.0-44.9, adult [facility]) Metabolic test done, sample weekly meal plan for 1800 and 2000 calorie diet provided to patient as well as information about signs of hyperglycemia and hypoglycemia as well as additional information about diet and lifestyle modifications.  - Metabolic Test - metFORMIN (GLUCOPHAGE-XR) 500 MG 24 hr tablet; Take 1 tablet (500 mg total) by mouth daily with breakfast.  Dispense:  30 tablet; Refill: 2   General Counseling: Violia verbalizes understanding of the findings of todays visit and agrees with plan of treatment. I have discussed any further diagnostic evaluation that may be needed or ordered today. We also reviewed her medications today. she has been encouraged to call the office with any questions or concerns that should arise related to todays visit.    Orders Placed This Encounter  Procedures  . Metabolic Test    Meds ordered this encounter  Medications  . metFORMIN (GLUCOPHAGE-XR) 500 MG 24 hr tablet    Sig: Take 1 tablet (500 mg total) by mouth daily with breakfast.    Dispense:  30 tablet    Refill:  2    Please fill today  . Vitamin D, Ergocalciferol, (DRISDOL) 1.25 MG (50000 UNIT) CAPS capsule    Sig: Take 1 capsule (50,000 Units total) by mouth every 7 (seven) days.    Dispense:  12 capsule    Refill:  1    Return in about 1 month (around 07/08/2022) for F/U, Weight loss, Tametria Aho PCP.   Total time spent:30 Minutes Time spent includes review of chart, medications, test  results, and follow up plan with the patient.   [place] Controlled Substance Database was reviewed by me.  This patient was seen by Sallyanne Kuster, FNP-C in collaboration with Dr. Beverely Risen as a part of collaborative care agreement.   Skyrah Krupp R. Tedd Sias, MSN, FNP-C Internal medicine

## 2022-06-20 ENCOUNTER — Encounter: Payer: Medicaid Other | Admitting: Nurse Practitioner

## 2022-06-25 DIAGNOSIS — J0100 Acute maxillary sinusitis, unspecified: Secondary | ICD-10-CM | POA: Diagnosis not present

## 2022-06-25 DIAGNOSIS — Z9109 Other allergy status, other than to drugs and biological substances: Secondary | ICD-10-CM | POA: Diagnosis not present

## 2022-07-12 ENCOUNTER — Ambulatory Visit: Payer: Medicaid Other | Admitting: Nurse Practitioner

## 2022-07-12 ENCOUNTER — Encounter: Payer: Self-pay | Admitting: Nurse Practitioner

## 2022-07-12 VITALS — BP 131/89 | HR 83 | Temp 96.6°F | Resp 16 | Ht 67.0 in | Wt 257.0 lb

## 2022-07-12 DIAGNOSIS — J3089 Other allergic rhinitis: Secondary | ICD-10-CM | POA: Diagnosis not present

## 2022-07-12 DIAGNOSIS — R7303 Prediabetes: Secondary | ICD-10-CM | POA: Diagnosis not present

## 2022-07-12 DIAGNOSIS — Z23 Encounter for immunization: Secondary | ICD-10-CM

## 2022-07-12 DIAGNOSIS — Z6841 Body Mass Index (BMI) 40.0 and over, adult: Secondary | ICD-10-CM | POA: Diagnosis not present

## 2022-07-12 MED ORDER — PHENTERMINE HCL 37.5 MG PO TABS: 37.5000 mg | ORAL_TABLET | Freq: Every day | ORAL | 0 refills | Status: DC

## 2022-07-12 MED ORDER — FEXOFENADINE HCL 180 MG PO TABS: 180.0000 mg | ORAL_TABLET | Freq: Every day | ORAL | 2 refills | Status: DC

## 2022-07-12 NOTE — Progress Notes (Signed)
[facility] [address] [place], [place] [address]  Internal MEDICINE  Office Visit Note  Patient Name: Sandy Burns  [id number]  [id number]  Date of Service: 07/12/2022  Chief Complaint  Patient presents with   Follow-up    Follow up weight loss    HPI Sandy Burns presents for a follow up visit for weight loss management and prediabetes.  Prediabetes --unable to get semaglutide approved, d/t no DX for T2DM, only prediabetes.  Has been making healthy diet and lifestyle modifications to improve her A1c.  Stopped taking metformin after 10 days due to feeling fatigued, no energy and depressed. Weight loss management --started phentermine at her last visit, has lost 4 pounds since then, approx 1lb/wk Allergic rhinitis --sees ENT for allergic rhinitis, taking Claritin and montelukast which is not helping, also takes Flonase nasal spray, supposed to be getting weekly allergy shots but her schedule makes it difficult Requests flu shot    Current Medication: Outpatient Encounter Medications as of 07/12/2022  Medication Sig   albuterol (VENTOLIN HFA) 108 (90 Base) MCG/ACT inhaler Inhale 2 puffs into the lungs every 4 (four) hours as needed for wheezing or shortness of breath.   EPIPEN 2-PAK 0.3 MG/0.3ML SOAJ injection Inject 0.3 mg into the muscle as needed for anaphylaxis.   fexofenadine (ALLEGRA) 180 MG tablet Take 1 tablet (180 mg total) by mouth daily.   fluticasone (FLONASE) 50 MCG/ACT nasal spray SHAKE LIQUID AND USE 1 SPRAY IN EACH NOSTRIL DAILY   fluticasone-salmeterol (ADVAIR DISKUS) 100-50 MCG/ACT AEPB INHALE 1 PUFF INTO THE LUNGS TWICE DAILY   montelukast (SINGULAIR) 10 MG tablet TAKE 1 TABLET(10 MG) BY MOUTH AT BEDTIME   Multiple Vitamin (MULTIVITAMIN PO) Take by mouth.   phentermine (ADIPEX-P) 37.5 MG tablet Take 1 tablet (37.5 mg total) by mouth daily before breakfast. May start with 1/2 tablet daily and increase to a whole tablet when ready.   Ruxolitinib Phosphate  (OPZELURA) 1.5 % CREA Apply to legs once a day   tretinoin (RETIN-A) 0.1 % cream Pea size amount to face nightly   Vitamin D, Ergocalciferol, (DRISDOL) 1.25 MG (50000 UNIT) CAPS capsule Take 1 capsule (50,000 Units total) by mouth every 7 (seven) days.   [DISCONTINUED] cetirizine (ZYRTEC) 5 MG tablet Take 1 tablet (5 mg total) by mouth daily.   [DISCONTINUED] metFORMIN (GLUCOPHAGE-XR) 500 MG 24 hr tablet Take 1 tablet (500 mg total) by mouth daily with breakfast.   [DISCONTINUED] NIFEdipine (ADALAT CC) 30 MG 24 hr tablet Take 1 tablet (30 mg total) by mouth daily.   No facility-administered encounter medications on file as of 07/12/2022.    Surgical History: Past Surgical History:  Procedure Laterality Date   FOOT SURGERY  2015   REPAIR VAGINAL CUFF N/A 09/06/2020   Procedure: REPAIR VAGINAL CUFF;  Surgeon: Hildred Laser, MD;  Location: [facility] ORS;  Service: Gynecology;  Laterality: N/A;   TOOTH EXTRACTION  2015    Medical History: Past Medical History:  Diagnosis Date   Anemia    Asthma    Eczema    Environmental allergies    History of blood transfusion    after giving birth 09/06/2020    Family History: Family History  Problem Relation Age of Onset   Rheum arthritis Mother    Hypertension Mother    Lupus Mother    Schizophrenia Father     Social History   Socioeconomic History   Marital status: Single    Spouse name: Not on file   Number of  children: Not on file   Years of education: Not on file   Highest education level: Not on file  Occupational History   Not on file  Tobacco Use   Smoking status: Never   Smokeless tobacco: Never  Vaping Use   Vaping Use: Never used  Substance and Sexual Activity   Alcohol use: Never    Alcohol/week: 0.0 standard drinks of alcohol   Drug use: Never   Sexual activity: Not Currently    Birth control/protection: I.U.D.  Other Topics Concern   Not on file  Social History Narrative   Not on file   Social Determinants of  Health   Financial Resource Strain: Not on file  Food Insecurity: Not on file  Transportation Needs: Not on file  Physical Activity: Not on file  Stress: Not on file  Social Connections: Not on file  Intimate Partner Violence: Not on file      Review of Systems  Constitutional:  Negative for chills, fatigue and unexpected weight change.  HENT:  Negative for congestion, rhinorrhea, sneezing and sore throat.   Eyes:  Negative for redness.  Respiratory: Negative.  Negative for cough, chest tightness, shortness of breath and wheezing.   Cardiovascular: Negative.  Negative for chest pain and palpitations.  Gastrointestinal:  Negative for abdominal pain, constipation, diarrhea, nausea and vomiting.  Genitourinary:  Negative for dysuria and frequency.  Musculoskeletal:  Negative for arthralgias, back pain, joint swelling and neck pain.  Skin:  Negative for rash.  Neurological: Negative.  Negative for tremors and numbness.  Hematological:  Negative for adenopathy. Does not bruise/bleed easily.  Psychiatric/Behavioral:  Negative for behavioral problems (Depression), sleep disturbance and suicidal ideas. The patient is not nervous/anxious.     Vital Signs: BP 131/89   Pulse 83   Temp (!) 96.6 F (35.9 C)   Resp 16   Ht 5\' 7"  (1.702 m)   Wt 257 lb (116.6 kg)   SpO2 97%   BMI 40.25 kg/m    Physical Exam Vitals reviewed.  Constitutional:      General: She is not in acute distress.    Appearance: Normal appearance. She is obese. She is not ill-appearing.  HENT:     Head: Normocephalic and atraumatic.  Eyes:     Pupils: Pupils are equal, round, and reactive to light.  Cardiovascular:     Rate and Rhythm: Normal rate and regular rhythm.  Pulmonary:     Effort: Pulmonary effort is normal. No respiratory distress.  Neurological:     Mental Status: She is alert and oriented to person, place, and time.  Psychiatric:        Mood and Affect: Mood normal.        Behavior: Behavior  normal.        Assessment/Plan: 1. Prediabetes Phentermine is helping the patient lose weight, weight loss will help improve her glucose levels and A1c. Discontinue metformin, not able to tolerate side effects. - phentermine (ADIPEX-P) 37.5 MG tablet; Take 1 tablet (37.5 mg total) by mouth daily before breakfast. May start with 1/2 tablet daily and increase to a whole tablet when ready.  Dispense: 30 tablet; Refill: 0  2. Non-seasonal allergic rhinitis due to other allergic trigger Stop claritin, start fexofenadine 1 tablet daily for allergic rhinitis - fexofenadine (ALLEGRA) 180 MG tablet; Take 1 tablet (180 mg total) by mouth daily.  Dispense: 30 tablet; Refill: 2  3. BMI 40.0-44.9, adult (HCC) Continue phentermine as prescribed, refill x1 month, follow up in 4  weeks - phentermine (ADIPEX-P) 37.5 MG tablet; Take 1 tablet (37.5 mg total) by mouth daily before breakfast. May start with 1/2 tablet daily and increase to a whole tablet when ready.  Dispense: 30 tablet; Refill: 0  4. Needs flu shot Flu shot administered today - Flu Vaccine MDCK QUAD PF   General Counseling: Sandy Burns verbalizes understanding of the findings of todays visit and agrees with plan of treatment. I have discussed any further diagnostic evaluation that may be needed or ordered today. We also reviewed her medications today. she has been encouraged to call the office with any questions or concerns that should arise related to todays visit.    Orders Placed This Encounter  Procedures   Flu Vaccine MDCK QUAD PF    Meds ordered this encounter  Medications   phentermine (ADIPEX-P) 37.5 MG tablet    Sig: Take 1 tablet (37.5 mg total) by mouth daily before breakfast. May start with 1/2 tablet daily and increase to a whole tablet when ready.    Dispense:  30 tablet    Refill:  0    Dx R73.03 and E66.01. do not run through insurance, pt will bring goodrx coupon   fexofenadine (ALLEGRA) 180 MG tablet    Sig: Take 1  tablet (180 mg total) by mouth daily.    Dispense:  30 tablet    Refill:  2    Return in about 4 weeks (around 08/09/2022) for F/U, Weight loss, Kataryna Mcquilkin PCP.   Total time spent:30 Minutes Time spent includes review of chart, medications, test results, and follow up plan with the patient.   [place] Controlled Substance Database was reviewed by me.  This patient was seen by Jonetta Osgood, FNP-C in collaboration with Dr. Clayborn Bigness as a part of collaborative care agreement.   Ilka Lovick R. Valetta Fuller, MSN, FNP-C Internal medicine

## 2022-07-14 ENCOUNTER — Encounter: Payer: Self-pay | Admitting: Nurse Practitioner

## 2022-07-14 MED ORDER — FLUCONAZOLE 150 MG PO TABS: 150.0000 mg | ORAL_TABLET | Freq: Once | ORAL | 0 refills | Status: DC

## 2022-07-14 MED ORDER — AMOXICILLIN-POT CLAVULANATE 875-125 MG PO TABS: 1.0000 | ORAL_TABLET | Freq: Two times a day (BID) | ORAL | 0 refills | Status: DC

## 2022-07-14 NOTE — Telephone Encounter (Signed)
See this

## 2022-07-15 DIAGNOSIS — H5213 Myopia, bilateral: Secondary | ICD-10-CM | POA: Diagnosis not present

## 2022-07-21 DIAGNOSIS — J069 Acute upper respiratory infection, unspecified: Secondary | ICD-10-CM | POA: Diagnosis not present

## 2022-07-21 MED ORDER — FLUCONAZOLE 150 MG PO TABS: 150.0000 mg | ORAL_TABLET | Freq: Once | ORAL | 0 refills | Status: AC

## 2022-07-21 MED ORDER — AMOXICILLIN-POT CLAVULANATE 875-125 MG PO TABS
1.0000 | ORAL_TABLET | Freq: Two times a day (BID) | ORAL | 0 refills | Status: AC
Start: 2022-07-21 — End: 2022-07-31

## 2022-07-21 NOTE — Addendum Note (Signed)
Addended by: Jonetta Osgood on: 07/21/2022 08:15 AM   Modules accepted: Orders

## 2022-07-28 DIAGNOSIS — J309 Allergic rhinitis, unspecified: Secondary | ICD-10-CM | POA: Diagnosis not present

## 2022-07-29 DIAGNOSIS — J301 Allergic rhinitis due to pollen: Secondary | ICD-10-CM | POA: Diagnosis not present

## 2022-08-01 DIAGNOSIS — J301 Allergic rhinitis due to pollen: Secondary | ICD-10-CM | POA: Diagnosis not present

## 2022-08-04 DIAGNOSIS — J301 Allergic rhinitis due to pollen: Secondary | ICD-10-CM | POA: Diagnosis not present

## 2022-08-05 DIAGNOSIS — K529 Noninfective gastroenteritis and colitis, unspecified: Secondary | ICD-10-CM | POA: Diagnosis not present

## 2022-08-11 DIAGNOSIS — J301 Allergic rhinitis due to pollen: Secondary | ICD-10-CM | POA: Diagnosis not present

## 2022-08-13 DIAGNOSIS — R059 Cough, unspecified: Secondary | ICD-10-CM | POA: Diagnosis not present

## 2022-08-13 DIAGNOSIS — Z20822 Contact with and (suspected) exposure to covid-19: Secondary | ICD-10-CM | POA: Diagnosis not present

## 2022-08-13 DIAGNOSIS — J101 Influenza due to other identified influenza virus with other respiratory manifestations: Secondary | ICD-10-CM | POA: Diagnosis not present

## 2022-08-15 ENCOUNTER — Encounter: Payer: Self-pay | Admitting: Nurse Practitioner

## 2022-08-15 ENCOUNTER — Ambulatory Visit: Payer: Medicaid Other | Admitting: Nurse Practitioner

## 2022-08-15 VITALS — BP 128/89 | HR 85 | Temp 97.4°F | Resp 16 | Ht 67.0 in | Wt 251.6 lb

## 2022-08-15 DIAGNOSIS — J4540 Moderate persistent asthma, uncomplicated: Secondary | ICD-10-CM | POA: Diagnosis not present

## 2022-08-15 DIAGNOSIS — J301 Allergic rhinitis due to pollen: Secondary | ICD-10-CM | POA: Diagnosis not present

## 2022-08-15 DIAGNOSIS — Z6841 Body Mass Index (BMI) 40.0 and over, adult: Secondary | ICD-10-CM

## 2022-08-15 DIAGNOSIS — R7303 Prediabetes: Secondary | ICD-10-CM | POA: Diagnosis not present

## 2022-08-15 MED ORDER — PHENTERMINE HCL 37.5 MG PO TABS: 37.5000 mg | ORAL_TABLET | Freq: Every day | ORAL | 0 refills | Status: DC

## 2022-08-15 NOTE — Progress Notes (Signed)
[facility] [address] [place], [place] [address]  Internal MEDICINE  Office Visit Note  Patient Name: Sandy Burns  [id number]  [id number]  Date of Service: 08/15/2022  Chief Complaint  Patient presents with   Follow-up    Follow up weight loss     HPI Molina presents for a follow up visit for weight loss management.  Weight loss -- down 6 lbs since prior office visit on phentermine. Felt heart racing some initially but not anymore. Heart rate and BP are normal.  Asthma -- has been getting worse, has an albuterol inhaler that she uses sometimes but has not had her respiratory function assessed in a while, wants to be established with pulmonary specialist.  Needs PFT   Current Medication: Outpatient Encounter Medications as of 08/15/2022  Medication Sig   albuterol (VENTOLIN HFA) 108 (90 Base) MCG/ACT inhaler Inhale 2 puffs into the lungs every 4 (four) hours as needed for wheezing or shortness of breath.   EPIPEN 2-PAK 0.3 MG/0.3ML SOAJ injection Inject 0.3 mg into the muscle as needed for anaphylaxis.   fexofenadine (ALLEGRA) 180 MG tablet Take 1 tablet (180 mg total) by mouth daily.   fluticasone (FLONASE) 50 MCG/ACT nasal spray SHAKE LIQUID AND USE 1 SPRAY IN EACH NOSTRIL DAILY   fluticasone-salmeterol (ADVAIR DISKUS) 100-50 MCG/ACT AEPB INHALE 1 PUFF INTO THE LUNGS TWICE DAILY   montelukast (SINGULAIR) 10 MG tablet TAKE 1 TABLET(10 MG) BY MOUTH AT BEDTIME   Multiple Vitamin (MULTIVITAMIN PO) Take by mouth.   Ruxolitinib Phosphate (OPZELURA) 1.5 % CREA Apply to legs once a day   tretinoin (RETIN-A) 0.1 % cream Pea size amount to face nightly   Vitamin D, Ergocalciferol, (DRISDOL) 1.25 MG (50000 UNIT) CAPS capsule Take 1 capsule (50,000 Units total) by mouth every 7 (seven) days.   [DISCONTINUED] phentermine (ADIPEX-P) 37.5 MG tablet Take 1 tablet (37.5 mg total) by mouth daily before breakfast. May start with 1/2 tablet daily and increase to a whole tablet when  ready.   phentermine (ADIPEX-P) 37.5 MG tablet Take 1 tablet (37.5 mg total) by mouth daily before breakfast.   [DISCONTINUED] NIFEdipine (ADALAT CC) 30 MG 24 hr tablet Take 1 tablet (30 mg total) by mouth daily.   No facility-administered encounter medications on file as of 08/15/2022.    Surgical History: Past Surgical History:  Procedure Laterality Date   FOOT SURGERY  2015   REPAIR VAGINAL CUFF N/A 09/06/2020   Procedure: REPAIR VAGINAL CUFF;  Surgeon: Hildred Laser, MD;  Location: [facility] ORS;  Service: Gynecology;  Laterality: N/A;   TOOTH EXTRACTION  2015    Medical History: Past Medical History:  Diagnosis Date   Anemia    Asthma    Eczema    Environmental allergies    History of blood transfusion    after giving birth 09/06/2020    Family History: Family History  Problem Relation Age of Onset   Rheum arthritis Mother    Hypertension Mother    Lupus Mother    Schizophrenia Father     Social History   Socioeconomic History   Marital status: Single    Spouse name: Not on file   Number of children: Not on file   Years of education: Not on file   Highest education level: Not on file  Occupational History   Not on file  Tobacco Use   Smoking status: Never   Smokeless tobacco: Never  Vaping Use   Vaping Use: Never used  Substance and  Sexual Activity   Alcohol use: Never    Alcohol/week: 0.0 standard drinks of alcohol   Drug use: Never   Sexual activity: Not Currently    Birth control/protection: I.U.D.  Other Topics Concern   Not on file  Social History Narrative   Not on file   Social Determinants of Health   Financial Resource Strain: Not on file  Food Insecurity: Not on file  Transportation Needs: Not on file  Physical Activity: Not on file  Stress: Not on file  Social Connections: Not on file  Intimate Partner Violence: Not on file      Review of Systems  Constitutional:  Positive for fatigue (getting over flu B). Negative for chills and  fever.  HENT:  Positive for congestion, postnasal drip and rhinorrhea. Negative for sinus pressure, sinus pain, sneezing and sore throat.   Respiratory:  Positive for shortness of breath (intermittent). Negative for cough, chest tightness and wheezing.   Cardiovascular: Negative.  Negative for chest pain and palpitations.  Gastrointestinal: Negative.  Negative for abdominal pain, constipation, diarrhea, nausea and vomiting.  Musculoskeletal:  Negative for arthralgias.    Vital Signs: BP 128/89   Pulse 85   Temp (!) 97.4 F (36.3 C)   Resp 16   Ht 5\' 7"  (1.702 m)   Wt 251 lb 9.6 oz (114.1 kg)   SpO2 96%   BMI 39.41 kg/m    Physical Exam Vitals reviewed.  Constitutional:      General: She is not in acute distress.    Appearance: Normal appearance. She is obese. She is not ill-appearing.  HENT:     Head: Normocephalic and atraumatic.  Eyes:     Pupils: Pupils are equal, round, and reactive to light.  Cardiovascular:     Rate and Rhythm: Normal rate and regular rhythm.     Heart sounds: Normal heart sounds. No murmur heard. Pulmonary:     Effort: Pulmonary effort is normal. No respiratory distress.     Breath sounds: Normal breath sounds. No wheezing.  Neurological:     Mental Status: She is alert and oriented to person, place, and time.  Psychiatric:        Mood and Affect: Mood normal.        Behavior: Behavior normal.        Assessment/Plan: 1. Moderate persistent asthma without complication PFT ordered, have PFT done prior to new patient visit with Dr. Devona Konig for pulmonary - Pulmonary function test; Future  2. Prediabetes Phentermine is helping with weight loss, which will hep decrease her A1c and improve her diabetes.  - phentermine (ADIPEX-P) 37.5 MG tablet; Take 1 tablet (37.5 mg total) by mouth daily before breakfast.  Dispense: 30 tablet; Refill: 0  3. BMI 40.0-44.9, adult (HCC) Lost 6 lbs, will follow up in 4 weeks to reassess.  - phentermine  (ADIPEX-P) 37.5 MG tablet; Take 1 tablet (37.5 mg total) by mouth daily before breakfast.  Dispense: 30 tablet; Refill: 0   General Counseling: Jewelianna verbalizes understanding of the findings of todays visit and agrees with plan of treatment. I have discussed any further diagnostic evaluation that may be needed or ordered today. We also reviewed her medications today. she has been encouraged to call the office with any questions or concerns that should arise related to todays visit.    Orders Placed This Encounter  Procedures   Pulmonary function test    Meds ordered this encounter  Medications   phentermine (ADIPEX-P) 37.5 MG tablet  Sig: Take 1 tablet (37.5 mg total) by mouth daily before breakfast.    Dispense:  30 tablet    Refill:  0    Dx R73.03 and E66.01. do not run through insurance, pt will bring goodrx coupon    Return in about 4 weeks (around 09/12/2022) for F/U, Weight loss, Lateya Dauria PCP and also need PFT visit followed by new pt with [facility] for pulm.   Total time spent:30 Minutes Time spent includes review of chart, medications, test results, and follow up plan with the patient.   [place] Controlled Substance Database was reviewed by me.  This patient was seen by Sallyanne Kuster, FNP-C in collaboration with Dr. Beverely Risen as a part of collaborative care agreement.   Allie Gerhold R. Tedd Sias, MSN, FNP-C Internal medicine

## 2022-08-22 DIAGNOSIS — J301 Allergic rhinitis due to pollen: Secondary | ICD-10-CM | POA: Diagnosis not present

## 2022-08-24 DIAGNOSIS — J301 Allergic rhinitis due to pollen: Secondary | ICD-10-CM | POA: Diagnosis not present

## 2022-08-25 DIAGNOSIS — J301 Allergic rhinitis due to pollen: Secondary | ICD-10-CM | POA: Diagnosis not present

## 2022-08-29 DIAGNOSIS — J301 Allergic rhinitis due to pollen: Secondary | ICD-10-CM | POA: Diagnosis not present

## 2022-09-08 DIAGNOSIS — J301 Allergic rhinitis due to pollen: Secondary | ICD-10-CM | POA: Diagnosis not present

## 2022-09-11 ENCOUNTER — Other Ambulatory Visit: Payer: Self-pay

## 2022-09-11 ENCOUNTER — Encounter: Payer: Self-pay | Admitting: Emergency Medicine

## 2022-09-11 ENCOUNTER — Emergency Department
Admission: EM | Admit: 2022-09-11 | Discharge: 2022-09-12 | Disposition: A | Discharge status: Home / Self Care | Payer: Medicaid Other | Attending: Emergency Medicine | Admitting: Emergency Medicine

## 2022-09-11 DIAGNOSIS — Z20822 Contact with and (suspected) exposure to covid-19: Secondary | ICD-10-CM | POA: Insufficient documentation

## 2022-09-11 DIAGNOSIS — J069 Acute upper respiratory infection, unspecified: Secondary | ICD-10-CM | POA: Insufficient documentation

## 2022-09-11 DIAGNOSIS — B9789 Other viral agents as the cause of diseases classified elsewhere: Secondary | ICD-10-CM | POA: Diagnosis not present

## 2022-09-11 DIAGNOSIS — J45909 Unspecified asthma, uncomplicated: Secondary | ICD-10-CM | POA: Insufficient documentation

## 2022-09-11 DIAGNOSIS — R059 Cough, unspecified: Secondary | ICD-10-CM | POA: Diagnosis not present

## 2022-09-11 LAB — RESP PANEL BY RT-PCR (FLU A&B, COVID) ARPGX2
Influenza A by PCR: NEGATIVE
Influenza B by PCR: NEGATIVE
SARS Coronavirus 2 by RT PCR: NEGATIVE

## 2022-09-11 NOTE — ED Provider Triage Note (Signed)
Emergency Medicine Provider Triage Evaluation Note  Margaux Engen, a 21 y.o. female  was evaluated in triage.  Pt complains of cough productive yellow cough.  Patient also reports intermittent dizziness.  She reports symptoms since Thursday, with an associated sore throat.  Review of Systems  Positive: Cough, sore throat Negative: FCS  Physical Exam  BP 135/85   Pulse 87   Temp 98.3 F (36.8 C) (Oral)   Resp 18   SpO2 96%  Gen:   Awake, no distress  NAD Resp:  Normal effort CTA MSK:   Moves extremities without difficulty  Other:    Medical Decision Making  Medically screening exam initiated at 6:43 PM.  Appropriate orders placed.  Jaclynne Baldo was informed that the remainder of the evaluation will be completed by another provider, this initial triage assessment does not replace that evaluation, and the importance of remaining in the ED until their evaluation is complete.  Patient to the ED for evaluation of several days of cough and congestion.  She also reports an intermittent sore throat.   Lissa Hoard, PA-C 09/11/22 1847

## 2022-09-11 NOTE — Discharge Instructions (Signed)
Take acetaminophen 650 mg and ibuprofen 400 mg every 6 hours for fever and pain.  Take with food.  Thank you for choosing [place] for your health care today!  Please see your primary doctor this week for a follow up appointment.   If you do not have a primary doctor call the following clinics to establish care:  If you have insurance:  [facility] [phone] [address] [place] [place] [address]   [facility]  [phone] [address] [place] [place] [address]   If you do not have insurance:  [facility]  [phone] [address], [place] [address]  Sometimes, in the early stages of certain disease courses it is difficult to detect in the emergency department evaluation -- so, it is important that you continue to monitor your symptoms and call your doctor right away or return to the emergency department if you develop any new or worsening symptoms.  If you have shortness of breath or wheezing, take your albuterol 2 puffs every 4-6 hours as needed.  It was my pleasure to care for you today.   Daneil Dan Modesto Charon, MD

## 2022-09-11 NOTE — ED Provider Notes (Signed)
[facility] Provider Note    Event Date/Time   First MD Initiated Contact with Patient 09/11/22 1855     (approximate)   History   Cough   HPI  Sandy Burns is a 21 y.o. female   Past medical history of asthma who presents to the emergency department with cough, myalgias, nasal congestion for the last several days.  Her son has same symptoms.  She has no significant shortness of breath or wheezing.  Denies chest pain.  No abdominal pain, nausea, vomiting, diarrhea or urinary symptoms.  History was obtained via the patient.  Reviewed external medical notes including emergency department visit dated 04/28/2022 when she was seen and discharged for asthma exacerbation given asthma treatment.      Physical Exam   Triage Vital Signs: ED Triage Vitals  Enc Vitals Group     BP 09/11/22 1743 135/85     Pulse Rate 09/11/22 1743 87     Resp 09/11/22 1743 18     Temp 09/11/22 1743 98.3 F (36.8 C)     Temp Source 09/11/22 1743 Oral     SpO2 09/11/22 1743 96 %     Weight --      Height --      Head Circumference --      Peak Flow --      Pain Score 09/11/22 1744 0     Pain Loc --      Pain Edu? --      Excl. in GC? --     Most recent vital signs: Vitals:   09/11/22 1743  BP: 135/85  Pulse: 87  Resp: 18  Temp: 98.3 F (36.8 C)  SpO2: 96%    General: Awake, no distress.  CV:  Good peripheral perfusion.  Resp:  Normal effort.  Abd:  No distention.  Other:  Awake alert and pleasant.  No respiratory distress.  Lungs are clear to auscultation without focalities or wheezing.  Posterior oropharynx appears normal without exudates lesions or masses.  Abdomen is soft nontender, overall well-appearing nontoxic.  Neck is supple with full range of motion afebrile normal hemodynamics.   ED Results / Procedures / Treatments   Labs (all labs ordered are listed, but only abnormal results are displayed) Labs Reviewed  RESP PANEL BY RT-PCR (FLU A&B, COVID)  ARPGX2     I reviewed labs and they are notable for negative flu and COVID test.   PROCEDURES:  Critical Care performed: No  Procedures   MEDICATIONS ORDERED IN ED: Medications - No data to display  IMPRESSION / MDM / ASSESSMENT AND PLAN / ED COURSE  I reviewed the triage vital signs and the nursing notes.                              Differential diagnosis includes, but is not limited to, viral URI, asthma, bacterial pneumonia considered but less likely sepsis or other emergent bacterial infections.  MDM: Overall well-appearing patient with viral URI symptoms and history of asthma but no wheezing on auscultation no respiratory distress.  No focalities on lung exam doubt bacterial pneumonia.  Son with similar viral URI symptoms.  Anticipatory guidance given to patient, follow-up with PMD and return precautions given  Patient's presentation is most consistent with acute presentation with potential threat to life or bodily function.       FINAL CLINICAL IMPRESSION(S) / ED DIAGNOSES   Final diagnoses:  Viral  URI with cough     Rx / DC Orders   ED Discharge Orders     None        Note:  This document was prepared using Dragon voice recognition software and may include unintentional dictation errors.    Pilar Jarvis, MD 09/11/22 [phone]

## 2022-09-11 NOTE — ED Triage Notes (Signed)
Patient to ED for cough- productive yellow colored mucous. Dizziness reported at times. Symptoms since Thursday and sore throat.

## 2022-09-12 ENCOUNTER — Encounter: Payer: Self-pay | Admitting: Nurse Practitioner

## 2022-09-12 ENCOUNTER — Ambulatory Visit: Payer: Medicaid Other | Admitting: Nurse Practitioner

## 2022-09-12 VITALS — BP 122/79 | HR 90 | Temp 98.1°F | Resp 16 | Ht 67.0 in | Wt 247.0 lb

## 2022-09-12 DIAGNOSIS — R7303 Prediabetes: Secondary | ICD-10-CM | POA: Diagnosis not present

## 2022-09-12 DIAGNOSIS — J4540 Moderate persistent asthma, uncomplicated: Secondary | ICD-10-CM

## 2022-09-12 DIAGNOSIS — J301 Allergic rhinitis due to pollen: Secondary | ICD-10-CM | POA: Diagnosis not present

## 2022-09-12 DIAGNOSIS — Z6841 Body Mass Index (BMI) 40.0 and over, adult: Secondary | ICD-10-CM | POA: Diagnosis not present

## 2022-09-12 NOTE — Progress Notes (Signed)
[facility] [address] [place], [place] [address]  Internal MEDICINE  Office Visit Note  Patient Name: Sandy Burns  [id number]  [id number]  Date of Service: 09/12/2022  Chief Complaint  Patient presents with   Follow-up    HPI Genelle presents for a follow up visit for weight loss management --taking phentermine --lost 4lbs since last office visit.  --denies any heart palpitations or other side effects    Current Medication: Outpatient Encounter Medications as of 09/12/2022  Medication Sig   albuterol (VENTOLIN HFA) 108 (90 Base) MCG/ACT inhaler Inhale 2 puffs into the lungs every 4 (four) hours as needed for wheezing or shortness of breath.   EPIPEN 2-PAK 0.3 MG/0.3ML SOAJ injection Inject 0.3 mg into the muscle as needed for anaphylaxis.   fexofenadine (ALLEGRA) 180 MG tablet Take 1 tablet (180 mg total) by mouth daily.   fluticasone (FLONASE) 50 MCG/ACT nasal spray SHAKE LIQUID AND USE 1 SPRAY IN EACH NOSTRIL DAILY   fluticasone-salmeterol (ADVAIR DISKUS) 100-50 MCG/ACT AEPB INHALE 1 PUFF INTO THE LUNGS TWICE DAILY   montelukast (SINGULAIR) 10 MG tablet TAKE 1 TABLET(10 MG) BY MOUTH AT BEDTIME   Multiple Vitamin (MULTIVITAMIN PO) Take by mouth.   phentermine (ADIPEX-P) 37.5 MG tablet Take 1 tablet (37.5 mg total) by mouth daily before breakfast.   Ruxolitinib Phosphate (OPZELURA) 1.5 % CREA Apply to legs once a day   tretinoin (RETIN-A) 0.1 % cream Pea size amount to face nightly   Vitamin D, Ergocalciferol, (DRISDOL) 1.25 MG (50000 UNIT) CAPS capsule Take 1 capsule (50,000 Units total) by mouth every 7 (seven) days.   [DISCONTINUED] NIFEdipine (ADALAT CC) 30 MG 24 hr tablet Take 1 tablet (30 mg total) by mouth daily.   No facility-administered encounter medications on file as of 09/12/2022.    Surgical History: Past Surgical History:  Procedure Laterality Date   FOOT SURGERY  2015   REPAIR VAGINAL CUFF N/A 09/06/2020   Procedure: REPAIR VAGINAL CUFF;   Surgeon: Hildred Laser, MD;  Location: [facility] ORS;  Service: Gynecology;  Laterality: N/A;   TOOTH EXTRACTION  2015    Medical History: Past Medical History:  Diagnosis Date   Anemia    Asthma    Eczema    Environmental allergies    History of blood transfusion    after giving birth 09/06/2020    Family History: Family History  Problem Relation Age of Onset   Rheum arthritis Mother    Hypertension Mother    Lupus Mother    Schizophrenia Father     Social History   Socioeconomic History   Marital status: Single    Spouse name: Not on file   Number of children: Not on file   Years of education: Not on file   Highest education level: Not on file  Occupational History   Not on file  Tobacco Use   Smoking status: Never   Smokeless tobacco: Never  Vaping Use   Vaping Use: Never used  Substance and Sexual Activity   Alcohol use: Never    Alcohol/week: 0.0 standard drinks of alcohol   Drug use: Never   Sexual activity: Not Currently    Birth control/protection: I.U.D.  Other Topics Concern   Not on file  Social History Narrative   Not on file   Social Determinants of Health   Financial Resource Strain: Not on file  Food Insecurity: Not on file  Transportation Needs: Not on file  Physical Activity: Not on file  Stress: Not  on file  Social Connections: Not on file  Intimate Partner Violence: Not on file      Review of Systems  Constitutional:  Positive for fatigue (getting over flu B). Negative for chills and fever.  HENT:  Positive for congestion, postnasal drip and rhinorrhea. Negative for sinus pressure, sinus pain, sneezing and sore throat.   Respiratory:  Positive for shortness of breath (intermittent). Negative for cough, chest tightness and wheezing.   Cardiovascular: Negative.  Negative for chest pain and palpitations.  Gastrointestinal: Negative.  Negative for abdominal pain, constipation, diarrhea, nausea and vomiting.  Musculoskeletal:  Negative for  arthralgias.    Vital Signs: BP 122/79   Pulse 90   Temp 98.1 F (36.7 C)   Resp 16   Ht 5\' 7"  (1.702 m)   Wt 247 lb (112 kg)   SpO2 99%   BMI 38.69 kg/m    Physical Exam Vitals reviewed.  Constitutional:      General: She is not in acute distress.    Appearance: Normal appearance. She is obese. She is not ill-appearing.  HENT:     Head: Normocephalic and atraumatic.  Eyes:     Pupils: Pupils are equal, round, and reactive to light.  Cardiovascular:     Rate and Rhythm: Normal rate and regular rhythm.     Heart sounds: Normal heart sounds. No murmur heard. Pulmonary:     Effort: Pulmonary effort is normal. No respiratory distress.     Breath sounds: Normal breath sounds. No wheezing.  Neurological:     Mental Status: She is alert and oriented to person, place, and time.  Psychiatric:        Mood and Affect: Mood normal.        Behavior: Behavior normal.        Assessment/Plan: 1. Prediabetes Will improve as she continues to lose weight   2. Moderate persistent asthma without complication Waiting for patient to have PFT done was ordered on prior office visit,   3. BMI 40.0-44.9, adult (HCC) Lost 4 lb since last visit. On phentermine, still need to pick up last month prescription. Wants to continue for another month.    General Counseling: Jimia verbalizes understanding of the findings of todays visit and agrees with plan of treatment. I have discussed any further diagnostic evaluation that may be needed or ordered today. We also reviewed her medications today. she has been encouraged to call the office with any questions or concerns that should arise related to todays visit.    No orders of the defined types were placed in this encounter.   No orders of the defined types were placed in this encounter.   Return in about 4 weeks (around 10/10/2022) for F/U, Weight loss, Solan Vosler PCP.   Total time spent:20 Minutes Time spent includes review of chart,  medications, test results, and follow up plan with the patient.   [place] Controlled Substance Database was reviewed by me.  This patient was seen by 12/09/2022, FNP-C in collaboration with Dr. Sallyanne Kuster as a part of collaborative care agreement.   Shereta Crothers R. Beverely Risen, MSN, FNP-C Internal medicine

## 2022-09-14 ENCOUNTER — Telehealth: Payer: Self-pay | Admitting: Nurse Practitioner

## 2022-09-14 DIAGNOSIS — J301 Allergic rhinitis due to pollen: Secondary | ICD-10-CM | POA: Diagnosis not present

## 2022-09-14 NOTE — Telephone Encounter (Signed)
Left vm and sent mychart message to confirm 09/21/22 appointment-[name] 

## 2022-09-15 ENCOUNTER — Other Ambulatory Visit: Payer: Self-pay | Admitting: Nurse Practitioner

## 2022-09-15 DIAGNOSIS — J4520 Mild intermittent asthma, uncomplicated: Secondary | ICD-10-CM

## 2022-09-15 DIAGNOSIS — J301 Allergic rhinitis due to pollen: Secondary | ICD-10-CM | POA: Diagnosis not present

## 2022-09-19 DIAGNOSIS — J301 Allergic rhinitis due to pollen: Secondary | ICD-10-CM | POA: Diagnosis not present

## 2022-09-21 ENCOUNTER — Ambulatory Visit: Payer: Medicaid Other | Admitting: Internal Medicine

## 2022-09-21 DIAGNOSIS — J4540 Moderate persistent asthma, uncomplicated: Secondary | ICD-10-CM

## 2022-09-22 ENCOUNTER — Other Ambulatory Visit: Payer: Self-pay

## 2022-09-22 ENCOUNTER — Emergency Department
Admission: EM | Admit: 2022-09-22 | Discharge: 2022-09-22 | Disposition: A | Discharge status: Home / Self Care | Payer: Medicaid Other | Attending: Emergency Medicine | Admitting: Emergency Medicine

## 2022-09-22 DIAGNOSIS — J45909 Unspecified asthma, uncomplicated: Secondary | ICD-10-CM | POA: Diagnosis not present

## 2022-09-22 DIAGNOSIS — J020 Streptococcal pharyngitis: Secondary | ICD-10-CM | POA: Insufficient documentation

## 2022-09-22 DIAGNOSIS — J029 Acute pharyngitis, unspecified: Secondary | ICD-10-CM | POA: Diagnosis present

## 2022-09-22 DIAGNOSIS — Z1152 Encounter for screening for COVID-19: Secondary | ICD-10-CM | POA: Insufficient documentation

## 2022-09-22 LAB — RESP PANEL BY RT-PCR (RSV, FLU A&B, COVID)  RVPGX2
Influenza A by PCR: NEGATIVE
Influenza B by PCR: NEGATIVE
Resp Syncytial Virus by PCR: NEGATIVE
SARS Coronavirus 2 by RT PCR: NEGATIVE

## 2022-09-22 LAB — GROUP A STREP BY PCR: Group A Strep by PCR: DETECTED — AB

## 2022-09-22 MED ORDER — PENICILLIN V POTASSIUM 500 MG PO TABS: 500.0000 mg | ORAL_TABLET | Freq: Three times a day (TID) | ORAL | 0 refills | Status: AC

## 2022-09-22 MED ORDER — ACETAMINOPHEN 325 MG PO TABS
650.0000 mg | ORAL_TABLET | Freq: Once | ORAL | Status: AC | PRN
  Administered 2022-09-22: 650 mg via ORAL
  Filled 2022-09-22: qty 2

## 2022-09-22 NOTE — ED Triage Notes (Signed)
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 Pt alert, NAD at this time, no SOB noted.

## 2022-09-22 NOTE — Discharge Instructions (Signed)
You were diagnosed with strep throat today.  Please take Tylenol/Motrin per package instructions in addition to the antibiotics was prescribed.  Please take the entire course of antibiotics even if begin to feel better.  Please return for any new, worsening, or changing symptoms or other concerns.

## 2022-09-22 NOTE — ED Provider Triage Note (Signed)
Emergency Medicine Provider Triage Evaluation Note  Sandy Burns , a 21 y.o. female  was evaluated in triage.  Pt complains of fever, sore throat, cough and congestion.  Patient works at a daycare.  Review of Systems  Positive:  Negative:   Physical Exam  BP (!) 115/95 (BP Location: Left Arm)   Pulse (!) 130   Temp (!) 100.5 F (38.1 C) (Oral)   Resp 20   Ht 5\' 7"  (1.702 m)   Wt 112 kg   SpO2 100%   BMI 38.69 kg/m  Gen:   Awake, no distress   Resp:  Normal effort  MSK:   Moves extremities without difficulty  Other:    Medical Decision Making  Medically screening exam initiated at 11:13 AM.  Appropriate orders placed.  Sandy Burns was informed that the remainder of the evaluation will be completed by another provider, this initial triage assessment does not replace that evaluation, and the importance of remaining in the ED until their evaluation is complete.     Sandy Bucco, PA-C 09/22/22 1113

## 2022-09-22 NOTE — ED Provider Notes (Signed)
[facility] Provider Note    Event Date/Time   First MD Initiated Contact with Patient 09/22/22 1122     (approximate)   History   Nasal Congestion and Sore Throat   HPI  Sandy Burns is a 21 y.o. female with a past medical history of asthma who presents today for evaluation of sore throat for the past 2 to 3 days.  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  She denies cough, trouble breathing, or swallowing.  No chest pain or shortness of breath.  She is unsure if she has had a fever.  She has not taken anything for her symptoms.  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   Vaginal vault hematoma 09/07/2020   Symptomatic anemia 09/07/2020   Maternal blood transfusion 09/07/2020   Obesity in pregnancy 09/07/2020   Mild pre-eclampsia in third trimester 09/05/2020   Pregnancy 08/22/2020   Asthma affecting pregnancy in third trimester 08/18/2020   Indication for care in labor or delivery 08/14/2020   Labor and delivery, indication for care 07/19/2020          Physical Exam   Triage Vital Signs: ED Triage Vitals  Enc Vitals Group     BP 09/22/22 1059 (!) 115/95     Pulse Rate 09/22/22 1059 (!) 130     Resp 09/22/22 1059 20     Temp 09/22/22 1059 (!) 100.5 F (38.1 C)     Temp Source 09/22/22 1059 Oral     SpO2 09/22/22 1059 100 %     Weight 09/22/22 1102 247 lb (112 kg)     Height 09/22/22 1102 5\' 7"  (1.702 m)     Head Circumference --      Peak Flow --      Pain Score 09/22/22 1102 8     Pain Loc --      Pain Edu? --      Excl. in GC? --     Most recent vital signs: Vitals:   09/22/22 1227 09/22/22 1229  BP:    Pulse: (!) 135   Resp:  18  Temp:    SpO2:  97%    Physical Exam Vitals and nursing note reviewed.  Constitutional:      General: Awake and alert. No acute distress.    Appearance: Normal appearance. The patient is normal  weight.  HENT:     Head: Normocephalic and atraumatic.     Mouth: Mucous membranes are moist.  Uvula midline.  Bilateral tonsillar exudate.  No soft palate fluctuance.  No trismus.  No voice change.  No sublingual swelling.  No tender cervical lymphadenopathy.  No nuchal rigidity.  No drooling Eyes:     General: PERRL. Normal EOMs        Right eye: No discharge.        Left eye: No discharge.     Conjunctiva/sclera: Conjunctivae normal.  Cardiovascular:     Rate and Rhythm: Tachycardic rate and regular rhythm.     Pulses: Normal pulses.  Pulmonary:     Effort: Pulmonary effort is normal. No respiratory distress.     Breath sounds: Normal breath sounds.  Abdominal:     Abdomen is soft. There is no abdominal tenderness. No rebound or guarding. No distention. Musculoskeletal:        General: No swelling. Normal range of motion.     Cervical back:  Normal range of motion and neck supple.  Skin:    General: Skin is warm and dry.     Capillary Refill: Capillary refill takes less than 2 seconds.     Findings: No rash.  Neurological:     Mental Status: The patient is awake and alert.      ED Results / Procedures / Treatments   Labs (all labs ordered are listed, but only abnormal results are displayed) Labs Reviewed  GROUP A STREP BY PCR - Abnormal; Notable for the following components:      Result Value   Group A Strep by PCR DETECTED (*)    All other components within normal limits  RESP PANEL BY RT-PCR (RSV, FLU A&B, COVID)  RVPGX2     EKG     RADIOLOGY     PROCEDURES:  Critical Care performed:   Procedures   MEDICATIONS ORDERED IN ED: Medications  acetaminophen (TYLENOL) tablet 650 mg (650 mg Oral Given 09/22/22 1112)     IMPRESSION / MDM / ASSESSMENT AND PLAN / ED COURSE  I reviewed the triage vital signs and the nursing notes.   Differential diagnosis includes, but is not limited to, strep pharyngitis, viral pharyngitis, pneumonia, influenza, COVID-19,  other URI.  Patient is awake and alert, tachycardic and febrile on arrival.  She has bilateral tonsillar exudate, though no uvular deviation, no drooling, no trismus, no nuchal rigidity, no tender lymphadenopathy.  Swabs obtained in triage are positive for strep pharyngitis.  There are no clinical signs or symptoms of retropharyngeal or peritonsillar abscess.  She has no chest pain or shortness of breath to suggest myocarditis.  She was treated symptomatically with improvement of her symptoms.  She was started on antibiotics for her strep throat, and advised to take the antibiotic for the full duration of treatment even if she begins to feel better.  She understands and agrees with plan.  She was discharged in stable condition.   Patient's presentation is most consistent with acute complicated illness / injury requiring diagnostic workup.       FINAL CLINICAL IMPRESSION(S) / ED DIAGNOSES   Final diagnoses:  Strep pharyngitis     Rx / DC Orders   ED Discharge Orders          Ordered    penicillin v potassium (VEETID) 500 MG tablet  3 times daily        09/22/22 1208             Note:  This document was prepared using Dragon voice recognition software and may include unintentional dictation errors.   Jackelyn Hoehn, PA-C 09/22/22 1309    Chesley Noon, MD 09/22/22 [phone]

## 2022-09-22 NOTE — Procedures (Signed)
[facility] [address] [place], [address]    Complete Pulmonary Function Testing Interpretation:  FINDINGS:  Forced vital capacity is normal.  FEV1 is normal.  FEV1 FVC ratio is mildly decreased.  Postbronchodilator no significant change is noted in the FEV1.  Total lung capacity is increased residual volume is increased residual volume for lung pressure ratio is increased the FRC is increased.  DLCO was increased.  IMPRESSION:  This pulmonary function study is basically within normal limits clinical correlation is recommended.  Yevonne Pax, MD [facility] Pulmonary Critical Care Medicine Sleep Medicine

## 2022-09-27 LAB — PULMONARY FUNCTION TEST

## 2022-10-02 DIAGNOSIS — J029 Acute pharyngitis, unspecified: Secondary | ICD-10-CM | POA: Diagnosis not present

## 2022-10-04 DIAGNOSIS — J029 Acute pharyngitis, unspecified: Secondary | ICD-10-CM | POA: Diagnosis not present

## 2022-10-12 ENCOUNTER — Telehealth: Payer: Self-pay | Admitting: Internal Medicine

## 2022-10-12 ENCOUNTER — Ambulatory Visit: Payer: Medicaid Other | Admitting: Nurse Practitioner

## 2022-10-12 NOTE — Telephone Encounter (Signed)
Left vm and sent mychart message to confirm 10/17/22 appointment-[name]  

## 2022-10-13 ENCOUNTER — Ambulatory Visit: Payer: Medicaid Other | Admitting: Nurse Practitioner

## 2022-10-13 DIAGNOSIS — J301 Allergic rhinitis due to pollen: Secondary | ICD-10-CM | POA: Diagnosis not present

## 2022-10-14 DIAGNOSIS — J069 Acute upper respiratory infection, unspecified: Secondary | ICD-10-CM | POA: Diagnosis not present

## 2022-10-17 ENCOUNTER — Ambulatory Visit: Payer: Medicaid Other | Admitting: Internal Medicine

## 2022-10-17 ENCOUNTER — Encounter: Payer: Self-pay | Admitting: Internal Medicine

## 2022-10-17 VITALS — BP 120/79 | HR 102 | Temp 97.8°F | Resp 16 | Ht 67.0 in | Wt 231.8 lb

## 2022-10-17 DIAGNOSIS — J4520 Mild intermittent asthma, uncomplicated: Secondary | ICD-10-CM

## 2022-10-17 DIAGNOSIS — J3089 Other allergic rhinitis: Secondary | ICD-10-CM

## 2022-10-17 NOTE — Progress Notes (Unsigned)
[facility] [place], [place] [address]  Pulmonary Sleep Medicine   Office Visit Note  Patient Name: Sandy Burns DOB: January 22, 2001 MRN [id number]  Date of Service: 10/17/2022  Complaints/HPI: Has a history of strep. She was in the ER in December. She states she has exposure with her work. She states she has history of asthma. She was diagnosed at age 22. She takes inhalers and nebs. She has been on qvar and other inhalers. She has been under good control. She states als oshe has allergies and makes her asthma worse. She states she infrequently uses inhalers unless she has issues with allergies. She had PFT done in decemebr which were normal. She is on singulair and also is on nasal spray also on advair. She has been on symbicort and dulera  ROS  General: (-) fever, (-) chills, (-) night sweats, (-) weakness Skin: (-) rashes, (-) itching,. Eyes: (-) visual changes, (-) redness, (-) itching. Nose and Sinuses: (-) nasal stuffiness or itchiness, (-) postnasal drip, (-) nosebleeds, (-) sinus trouble. Mouth and Throat: (-) sore throat, (-) hoarseness. Neck: (-) swollen glands, (-) enlarged thyroid, (-) neck pain. Respiratory: - cough, (-) bloody sputum, - shortness of breath, - wheezing. Cardiovascular: - ankle swelling, (-) chest pain. Lymphatic: (-) lymph node enlargement. Neurologic: (-) numbness, (-) tingling. Psychiatric: (-) anxiety, (-) depression   Current Medication: Outpatient Encounter Medications as of 10/17/2022  Medication Sig   EPIPEN 2-PAK 0.3 MG/0.3ML SOAJ injection Inject 0.3 mg into the muscle as needed for anaphylaxis.   fexofenadine (ALLEGRA) 180 MG tablet Take 1 tablet (180 mg total) by mouth daily.   fluticasone (FLONASE) 50 MCG/ACT nasal spray SHAKE LIQUID AND USE 1 SPRAY IN EACH NOSTRIL DAILY   fluticasone-salmeterol (ADVAIR DISKUS) 100-50 MCG/ACT AEPB INHALE 1 PUFF INTO THE LUNGS TWICE DAILY   montelukast (SINGULAIR) 10 MG tablet TAKE 1  TABLET(10 MG) BY MOUTH AT BEDTIME   Multiple Vitamin (MULTIVITAMIN PO) Take by mouth.   phentermine (ADIPEX-P) 37.5 MG tablet Take 1 tablet (37.5 mg total) by mouth daily before breakfast.   Ruxolitinib Phosphate (OPZELURA) 1.5 % CREA Apply to legs once a day   tretinoin (RETIN-A) 0.1 % cream Pea size amount to face nightly   VENTOLIN HFA 108 (90 Base) MCG/ACT inhaler INHALE 2 PUFFS INTO THE LUNGS EVERY 4 HOURS AS NEEDED FOR WHEEZING OR SHORTNESS OF BREATH   Vitamin D, Ergocalciferol, (DRISDOL) 1.25 MG (50000 UNIT) CAPS capsule Take 1 capsule (50,000 Units total) by mouth every 7 (seven) days.   [DISCONTINUED] NIFEdipine (ADALAT CC) 30 MG 24 hr tablet Take 1 tablet (30 mg total) by mouth daily.   No facility-administered encounter medications on file as of 10/17/2022.    Surgical History: Past Surgical History:  Procedure Laterality Date   FOOT SURGERY  2015   REPAIR VAGINAL CUFF N/A 09/06/2020   Procedure: REPAIR VAGINAL CUFF;  Surgeon: Rubie Maid, MD;  Location: [facility] ORS;  Service: Gynecology;  Laterality: N/A;   TOOTH EXTRACTION  2015    Medical History: Past Medical History:  Diagnosis Date   Anemia    Asthma    Eczema    Environmental allergies    History of blood transfusion    after giving birth 09/06/2020    Family History: Family History  Problem Relation Age of Onset   Rheum arthritis Mother    Hypertension Mother    Lupus Mother    Schizophrenia Father     Social History: Social History  Socioeconomic History   Marital status: Single    Spouse name: Not on file   Number of children: Not on file   Years of education: Not on file   Highest education level: Not on file  Occupational History   Not on file  Tobacco Use   Smoking status: Never   Smokeless tobacco: Never  Vaping Use   Vaping Use: Never used  Substance and Sexual Activity   Alcohol use: Never    Alcohol/week: 0.0 standard drinks of alcohol   Drug use: Never   Sexual activity: Not  Currently    Birth control/protection: I.U.D.  Other Topics Concern   Not on file  Social History Narrative   Not on file   Social Determinants of Health   Financial Resource Strain: Not on file  Food Insecurity: Not on file  Transportation Needs: Not on file  Physical Activity: Not on file  Stress: Not on file  Social Connections: Not on file  Intimate Partner Violence: Not on file    Vital Signs: Blood pressure 120/79, pulse (!) 102, temperature 97.8 F (36.6 C), resp. rate 16, height 5\' 7"  (1.702 m), weight 231 lb 12.8 oz (105.1 kg), SpO2 98 %.  Examination: General Appearance: The patient is well-developed, well-nourished, and in no distress. Skin: Gross inspection of skin unremarkable. Head: normocephalic, no gross deformities. Eyes: no gross deformities noted. ENT: ears appear grossly normal no exudates. Neck: Supple. No thyromegaly. No LAD. Respiratory: no rhonchi noted. Cardiovascular: Normal S1 and S2 without murmur or rub. Extremities: No cyanosis. pulses are equal. Neurologic: Alert and oriented. No involuntary movements.  LABS: Recent Results (from the past 2160 hour(s))  Resp Panel by RT-PCR (Flu A&B, Covid) Anterior Nasal Swab     Status: None   Collection Time: 09/11/22  5:46 PM   Specimen: Anterior Nasal Swab  Result Value Ref Range   SARS Coronavirus 2 by RT PCR NEGATIVE NEGATIVE    Comment: (NOTE) SARS-CoV-2 target nucleic acids are NOT DETECTED.  The SARS-CoV-2 RNA is generally detectable in upper respiratory specimens during the acute phase of infection. The lowest concentration of SARS-CoV-2 viral copies this assay can detect is 138 copies/mL. A negative result does not preclude SARS-Cov-2 infection and should not be used as the sole basis for treatment or other patient management decisions. A negative result may occur with  improper specimen collection/handling, submission of specimen other than nasopharyngeal swab, presence of viral  mutation(s) within the areas targeted by this assay, and inadequate number of viral copies(<138 copies/mL). A negative result must be combined with clinical observations, patient history, and epidemiological information. The expected result is Negative.  Fact Sheet for Patients:  14/03/23  Fact Sheet for Healthcare Providers:  BloggerCourse.com  This test is no t yet approved or cleared by the [employer] FDA and  has been authorized for detection and/or diagnosis of SARS-CoV-2 by FDA under an Emergency Use Authorization (EUA). This EUA will remain  in effect (meaning this test can be used) for the duration of the COVID-19 declaration under Section 564(b)(1) of the Act, 21 U.S.C.section 360bbb-3(b)(1), unless the authorization is terminated  or revoked sooner.       Influenza A by PCR NEGATIVE NEGATIVE   Influenza B by PCR NEGATIVE NEGATIVE    Comment: (NOTE) The Xpert Xpress SARS-CoV-2/FLU/RSV plus assay is intended as an aid in the diagnosis of influenza from Nasopharyngeal swab specimens and should not be used as a sole basis for treatment. Nasal washings and aspirates  are unacceptable for Xpert Xpress SARS-CoV-2/FLU/RSV testing.  Fact Sheet for Patients: BloggerCourse.com  Fact Sheet for Healthcare Providers: SeriousBroker.it  This test is not yet approved or cleared by the [place] FDA and has been authorized for detection and/or diagnosis of SARS-CoV-2 by FDA under an Emergency Use Authorization (EUA). This EUA will remain in effect (meaning this test can be used) for the duration of the COVID-19 declaration under Section 564(b)(1) of the Act, 21 U.S.C. section 360bbb-3(b)(1), unless the authorization is terminated or revoked.  Performed at [facility], [address] [place], [place] [address]   Group A Strep by PCR     Status: Abnormal    Collection Time: 09/22/22 11:06 AM   Specimen: Throat; Sterile Swab  Result Value Ref Range   Group A Strep by PCR DETECTED (A) NOT DETECTED    Comment: Performed at [facility], [address] [place], [place] [address]  Resp panel by RT-PCR (RSV, Flu A&B, Covid) Throat     Status: None   Collection Time: 09/22/22 11:06 AM   Specimen: Throat; Nasal Swab  Result Value Ref Range   SARS Coronavirus 2 by RT PCR NEGATIVE NEGATIVE    Comment: (NOTE) SARS-CoV-2 target nucleic acids are NOT DETECTED.  The SARS-CoV-2 RNA is generally detectable in upper respiratory specimens during the acute phase of infection. The lowest concentration of SARS-CoV-2 viral copies this assay can detect is 138 copies/mL. A negative result does not preclude SARS-Cov-2 infection and should not be used as the sole basis for treatment or other patient management decisions. A negative result may occur with  improper specimen collection/handling, submission of specimen other than nasopharyngeal swab, presence of viral mutation(s) within the areas targeted by this assay, and inadequate number of viral copies(<138 copies/mL). A negative result must be combined with clinical observations, patient history, and epidemiological information. The expected result is Negative.  Fact Sheet for Patients:  BloggerCourse.com  Fact Sheet for Healthcare Providers:  SeriousBroker.it  This test is no t yet approved or cleared by the [place] FDA and  has been authorized for detection and/or diagnosis of SARS-CoV-2 by FDA under an Emergency Use Authorization (EUA). This EUA will remain  in effect (meaning this test can be used) for the duration of the COVID-19 declaration under Section 564(b)(1) of the Act, 21 U.S.C.section 360bbb-3(b)(1), unless the authorization is terminated  or revoked sooner.       Influenza A by PCR NEGATIVE NEGATIVE   Influenza B by PCR  NEGATIVE NEGATIVE    Comment: (NOTE) The Xpert Xpress SARS-CoV-2/FLU/RSV plus assay is intended as an aid in the diagnosis of influenza from Nasopharyngeal swab specimens and should not be used as a sole basis for treatment. Nasal washings and aspirates are unacceptable for Xpert Xpress SARS-CoV-2/FLU/RSV testing.  Fact Sheet for Patients: BloggerCourse.com  Fact Sheet for Healthcare Providers: SeriousBroker.it  This test is not yet approved or cleared by the [place] FDA and has been authorized for detection and/or diagnosis of SARS-CoV-2 by FDA under an Emergency Use Authorization (EUA). This EUA will remain in effect (meaning this test can be used) for the duration of the COVID-19 declaration under Section 564(b)(1) of the Act, 21 U.S.C. section 360bbb-3(b)(1), unless the authorization is terminated or revoked.     Resp Syncytial Virus by PCR NEGATIVE NEGATIVE    Comment: (NOTE) Fact Sheet for Patients: BloggerCourse.com  Fact Sheet for Healthcare Providers: SeriousBroker.it  This test is not yet approved or cleared by the  [place] [employer] trader and has been authorized for detection and/or diagnosis of SARS-CoV-2 by FDA under an TEFL teacher (EUA). This EUA will remain in effect (meaning this test can be used) for the duration of the COVID-19 declaration under Section 564(b)(1) of the Act, 21 U.S.C. section 360bbb-3(b)(1), unless the authorization is terminated or revoked.  Performed at [facility], [address] [place], [place] [address]   Pulmonary Function Test     Status: None   Collection Time: 09/27/22  8:18 AM  Result Value Ref Range   FEV1     FVC     FEV1/FVC     TLC     DLCO      Radiology: No results found.  No results found.  No results found.    Assessment and Plan: Patient Active Problem List   Diagnosis Date  Noted   Delay 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/07/2020   Vaginal vault hematoma 09/07/2020   Symptomatic anemia 09/07/2020   Maternal blood transfusion 09/07/2020   Obesity in pregnancy 09/07/2020   Mild pre-eclampsia in third trimester 09/05/2020   Pregnancy 08/22/2020   Asthma affecting pregnancy in third trimester 08/18/2020   Indication for care in labor or delivery 08/14/2020   Labor and delivery, indication for care 07/19/2020    1. Non-seasonal allergic rhinitis due to other allergic trigger Will continue with antihistamines as needed.  May need to consider allergy testing further to evaluate if this is contributing to her asthma  2. Mild intermittent asthma without complication Overall the asthma appears to be under good control I think we will continue with her current regimen as ordered.  3. Obesity, morbid (HCC) Losing weight would certainly help her as far as her breathing is concerned would recommend diet and exercise  General Counseling: I have discussed the findings of the evaluation and examination with Naiya.  I have also discussed any further diagnostic evaluation thatmay be needed or ordered today. Deryl verbalizes understanding of the findings of todays visit. We also reviewed her medications today and discussed drug interactions and side effects including but not limited excessive drowsiness and altered mental states. We also discussed that there is always a risk not just to her but also people around her. she has been encouraged to call the office with any questions or concerns that should arise related to todays visit.  No orders of the defined types were placed in this encounter.    Time spent: 47  I have personally obtained a history, examined the patient, evaluated laboratory and imaging results, formulated the assessment and plan and placed orders.    Yevonne Pax, MD [facility] Pulmonary and Critical Care Sleep medicine

## 2022-10-20 ENCOUNTER — Encounter: Payer: Self-pay | Admitting: Nurse Practitioner

## 2022-10-20 ENCOUNTER — Ambulatory Visit: Payer: Medicaid Other | Admitting: Nurse Practitioner

## 2022-10-20 VITALS — BP 119/75 | HR 103 | Temp 98.1°F | Resp 16 | Ht 67.0 in | Wt 231.8 lb

## 2022-10-20 DIAGNOSIS — J020 Streptococcal pharyngitis: Secondary | ICD-10-CM | POA: Diagnosis not present

## 2022-10-20 DIAGNOSIS — Z6836 Body mass index (BMI) 36.0-36.9, adult: Secondary | ICD-10-CM | POA: Diagnosis not present

## 2022-10-20 DIAGNOSIS — R7303 Prediabetes: Secondary | ICD-10-CM | POA: Diagnosis not present

## 2022-10-20 DIAGNOSIS — J301 Allergic rhinitis due to pollen: Secondary | ICD-10-CM | POA: Diagnosis not present

## 2022-10-20 MED ORDER — PHENTERMINE HCL 37.5 MG PO TABS: 37.5000 mg | ORAL_TABLET | Freq: Every day | ORAL | 0 refills | Status: DC

## 2022-10-20 NOTE — Progress Notes (Signed)
[facility] [address] [place], [place] [address]  Internal MEDICINE  Office Visit Note  Patient Name: Sandy Burns  [id number]  [id number]  Date of Service: 10/20/2022  Chief Complaint  Patient presents with   Follow-up    HPI Sandy Burns presents for a follow up visit for weight loss management --taking phentermine --lost 16 lbs since last office visit.  --denies any heart palpitations or other side effects  --PFT -- was normal with mildly decreased ratio.  Recent strep A, treated with PCN VK, then treated with clinda and still not better.  Still having symptoms, but just finished clinda yesterday.    Current Medication: Outpatient Encounter Medications as of 10/20/2022  Medication Sig   EPIPEN 2-PAK 0.3 MG/0.3ML SOAJ injection Inject 0.3 mg into the muscle as needed for anaphylaxis.   fexofenadine (ALLEGRA) 180 MG tablet Take 1 tablet (180 mg total) by mouth daily.   fluticasone (FLONASE) 50 MCG/ACT nasal spray SHAKE LIQUID AND USE 1 SPRAY IN EACH NOSTRIL DAILY   fluticasone-salmeterol (ADVAIR DISKUS) 100-50 MCG/ACT AEPB INHALE 1 PUFF INTO THE LUNGS TWICE DAILY   montelukast (SINGULAIR) 10 MG tablet TAKE 1 TABLET(10 MG) BY MOUTH AT BEDTIME   Multiple Vitamin (MULTIVITAMIN PO) Take by mouth.   Ruxolitinib Phosphate (OPZELURA) 1.5 % CREA Apply to legs once a day   tretinoin (RETIN-A) 0.1 % cream Pea size amount to face nightly   VENTOLIN HFA 108 (90 Base) MCG/ACT inhaler INHALE 2 PUFFS INTO THE LUNGS EVERY 4 HOURS AS NEEDED FOR WHEEZING OR SHORTNESS OF BREATH   Vitamin D, Ergocalciferol, (DRISDOL) 1.25 MG (50000 UNIT) CAPS capsule Take 1 capsule (50,000 Units total) by mouth every 7 (seven) days.   [DISCONTINUED] phentermine (ADIPEX-P) 37.5 MG tablet Take 1 tablet (37.5 mg total) by mouth daily before breakfast.   phentermine (ADIPEX-P) 37.5 MG tablet Take 1 tablet (37.5 mg total) by mouth daily before breakfast.   [DISCONTINUED] NIFEdipine (ADALAT CC) 30 MG 24 hr  tablet Take 1 tablet (30 mg total) by mouth daily.   No facility-administered encounter medications on file as of 10/20/2022.    Surgical History: Past Surgical History:  Procedure Laterality Date   FOOT SURGERY  2015   REPAIR VAGINAL CUFF N/A 09/06/2020   Procedure: REPAIR VAGINAL CUFF;  Surgeon: Hildred Laser, MD;  Location: [facility] ORS;  Service: Gynecology;  Laterality: N/A;   TOOTH EXTRACTION  2015    Medical History: Past Medical History:  Diagnosis Date   Anemia    Asthma    Eczema    Environmental allergies    History of blood transfusion    after giving birth 09/06/2020    Family History: Family History  Problem Relation Age of Onset   Rheum arthritis Mother    Hypertension Mother    Lupus Mother    Schizophrenia Father     Social History   Socioeconomic History   Marital status: Single    Spouse name: Not on file   Number of children: Not on file   Years of education: Not on file   Highest education level: Not on file  Occupational History   Not on file  Tobacco Use   Smoking status: Never   Smokeless tobacco: Never  Vaping Use   Vaping Use: Never used  Substance and Sexual Activity   Alcohol use: Never    Alcohol/week: 0.0 standard drinks of alcohol   Drug use: Never   Sexual activity: Not Currently    Birth control/protection: I.U.D.  Other Topics Concern   Not on file  Social History Narrative   Not on file   Social Determinants of Health   Financial Resource Strain: Not on file  Food Insecurity: Not on file  Transportation Needs: Not on file  Physical Activity: Not on file  Stress: Not on file  Social Connections: Not on file  Intimate Partner Violence: Not on file      Review of Systems  Constitutional:  Negative for chills, fatigue and fever.  HENT:  Positive for congestion, postnasal drip and rhinorrhea. Negative for sinus pressure, sinus pain, sneezing and sore throat.   Respiratory:  Positive for shortness of breath  (intermittent). Negative for cough, chest tightness and wheezing.   Cardiovascular: Negative.  Negative for chest pain and palpitations.  Gastrointestinal: Negative.  Negative for abdominal pain, constipation, diarrhea, nausea and vomiting.  Musculoskeletal:  Negative for arthralgias.    Vital Signs: BP 119/75   Pulse (!) 103   Temp 98.1 F (36.7 C)   Resp 16   Ht 5\' 7"  (1.702 m)   Wt 231 lb 12.8 oz (105.1 kg)   SpO2 97%   BMI 36.31 kg/m    Physical Exam Vitals reviewed.  Constitutional:      General: She is not in acute distress.    Appearance: Normal appearance. She is obese. She is not ill-appearing.  HENT:     Head: Normocephalic and atraumatic.  Eyes:     Pupils: Pupils are equal, round, and reactive to light.  Cardiovascular:     Rate and Rhythm: Normal rate and regular rhythm.     Heart sounds: Normal heart sounds. No murmur heard. Pulmonary:     Effort: Pulmonary effort is normal. No respiratory distress.     Breath sounds: Normal breath sounds. No wheezing.  Neurological:     Mental Status: She is alert and oriented to person, place, and time.  Psychiatric:        Mood and Affect: Mood normal.        Behavior: Behavior normal.        Assessment/Plan: 1. Prediabetes Continue phentermine for weight loss which will help to improve A1c.  - phentermine (ADIPEX-P) 37.5 MG tablet; Take 1 tablet (37.5 mg total) by mouth daily before breakfast.  Dispense: 30 tablet; Refill: 0  2. Class 2 severe obesity due to excess calories with serious comorbidity and body mass index (BMI) of 36.0 to 36.9 in adult [facility]) Continue phentermine for another month, follow up in 4 weeks - phentermine (ADIPEX-P) 37.5 MG tablet; Take 1 tablet (37.5 mg total) by mouth daily before breakfast.  Dispense: 30 tablet; Refill: 0  3. Strep pharyngitis Just finished clindamycin yesterday, give it a week, if no improvement, will send augmentin   General Counseling: Sandy Burns verbalizes  understanding of the findings of todays visit and agrees with plan of treatment. I have discussed any further diagnostic evaluation that may be needed or ordered today. We also reviewed her medications today. she has been encouraged to call the office with any questions or concerns that should arise related to todays visit.    No orders of the defined types were placed in this encounter.   Meds ordered this encounter  Medications   phentermine (ADIPEX-P) 37.5 MG tablet    Sig: Take 1 tablet (37.5 mg total) by mouth daily before breakfast.    Dispense:  30 tablet    Refill:  0    Dx R73.03 and E66.01. do not run through  insurance, pt will bring goodrx coupon    Return in about 4 weeks (around 11/17/2022) for F/U, Weight loss, Genesis Paget PCP.   Total time spent:30 Minutes Time spent includes review of chart, medications, test results, and follow up plan with the patient.   [place] Controlled Substance Database was reviewed by me.  This patient was seen by Jonetta Osgood, FNP-C in collaboration with Dr. Clayborn Bigness as a part of collaborative care agreement.   Patrica Mendell R. Valetta Fuller, MSN, FNP-C Internal medicine

## 2022-10-24 DIAGNOSIS — J301 Allergic rhinitis due to pollen: Secondary | ICD-10-CM | POA: Diagnosis not present

## 2022-10-24 MED ORDER — HPV 9-VALENT RECOMB VACCINE IM SUSP: 0.5000 mL | Freq: Once | INTRAMUSCULAR | 0 refills | Status: AC

## 2022-10-27 ENCOUNTER — Encounter: Payer: Self-pay | Admitting: Nurse Practitioner

## 2022-10-28 ENCOUNTER — Other Ambulatory Visit: Payer: Self-pay

## 2022-10-28 MED ORDER — AMOXICILLIN-POT CLAVULANATE 875-125 MG PO TABS: 1.0000 | ORAL_TABLET | Freq: Two times a day (BID) | ORAL | 0 refills | Status: DC

## 2022-10-31 DIAGNOSIS — J301 Allergic rhinitis due to pollen: Secondary | ICD-10-CM | POA: Diagnosis not present

## 2022-11-03 DIAGNOSIS — J301 Allergic rhinitis due to pollen: Secondary | ICD-10-CM | POA: Diagnosis not present

## 2022-11-10 DIAGNOSIS — J301 Allergic rhinitis due to pollen: Secondary | ICD-10-CM | POA: Diagnosis not present

## 2022-11-14 DIAGNOSIS — J301 Allergic rhinitis due to pollen: Secondary | ICD-10-CM | POA: Diagnosis not present

## 2022-11-17 ENCOUNTER — Ambulatory Visit: Payer: Medicaid Other | Admitting: Nurse Practitioner

## 2022-11-17 ENCOUNTER — Ambulatory Visit: Payer: Medicaid Other | Admitting: Dermatology

## 2022-11-17 VITALS — BP 102/71 | HR 102

## 2022-11-17 DIAGNOSIS — L209 Atopic dermatitis, unspecified: Secondary | ICD-10-CM | POA: Diagnosis not present

## 2022-11-17 DIAGNOSIS — J302 Other seasonal allergic rhinitis: Secondary | ICD-10-CM | POA: Diagnosis not present

## 2022-11-17 DIAGNOSIS — L819 Disorder of pigmentation, unspecified: Secondary | ICD-10-CM

## 2022-11-17 DIAGNOSIS — Z79899 Other long term (current) drug therapy: Secondary | ICD-10-CM

## 2022-11-17 DIAGNOSIS — Z7189 Other specified counseling: Secondary | ICD-10-CM | POA: Diagnosis not present

## 2022-11-17 DIAGNOSIS — L700 Acne vulgaris: Secondary | ICD-10-CM | POA: Diagnosis not present

## 2022-11-17 MED ORDER — TRETINOIN 0.1 % EX CREA: TOPICAL_CREAM | CUTANEOUS | 11 refills | Status: DC

## 2022-11-17 NOTE — Progress Notes (Signed)
Follow-Up Visit   Subjective  Sandy Burns is a 22 y.o. female who presents for the following: Other (Atopic neurodermatitis follow up still flared at arms and legs while using opzelura and eurcrisa ointment. //Acne still staying clear on benziclin and retin a).  The following portions of the chart were reviewed this encounter and updated as appropriate:  Tobacco  Allergies  Meds  Problems  Med Hx  Surg Hx  Fam Hx     Review of Systems: No other skin or systemic complaints except as noted in HPI or Assessment and Plan.  Objective  Well appearing patient in no apparent distress; mood and affect are within normal limits.  A focused examination was performed including arms, face, legs. Relevant physical exam findings are noted in the Assessment and Plan.  face Clear at exam  antecubital areas, legs, face Still hyperpigmentation and scale at arms and legs   Assessment & Plan  Acne vulgaris face Chronic condition with duration or expected duration over one year. Currently well-controlled.   Continue Benzaclin gel apply to face in the am Continue Retin A 0.1% cream apply to face at bedtime  Topical retinoid medications like tretinoin/Retin-A, adapalene/Differin, tazarotene/Fabior, and Epiduo/Epiduo Forte can cause dryness and irritation when first started. Only apply a pea-sized amount to the entire affected area. Avoid applying it around the eyes, edges of mouth and creases at the nose. If you experience irritation, use a good moisturizer first and/or apply the medicine less often. If you are doing well with the medicine, you can increase how often you use it until you are applying every night. Be careful with sun protection while using this medication as it can make you sensitive to the sun. This medicine should not be used by pregnant women.   tretinoin (RETIN-A) 0.1 % cream - face Pea size amount to face nightly  Atopic dermatitis, unspecified type antecubital areas,  legs, face With dyschromia and pruritus  Chronic and persistent condition with duration or expected duration over one year. Condition is symptomatic / bothersome to patient. Not to goal. Still itchy   Atopic dermatitis (eczema) is a chronic, relapsing, pruritic condition that can significantly affect quality of life. It is often associated with allergic rhinitis and/or asthma and can require treatment with topical medications, phototherapy, or in severe cases biologic injectable medication (Dupixent; Adbry) or Oral JAK inhibitors.    Continue Eucrisa ointment daily  Continue Opzelura cream apply to legs once a day Discussed Dupixent - patient would like to continue topical treatment at this time  Dupilumab ([place]) is a treatment given by injection for adults and children with moderate-to-severe atopic dermatitis. Goal is control of skin condition, not cure. It is given as 2 injections at the first dose followed by 1 injection ever 2 weeks thereafter.  Young children are dosed monthly.  Potential side effects include allergic reaction, herpes infections, injection site reactions and conjunctivitis (inflammation of the eyes).  The use of Dupixent requires long term medication management, including periodic office visits.  Seasonal allergies Head - Anterior (Face) Hx of Season allergies of dogs, cats, mold etc  Patient is currently being seen by allergist and gets allergy injections every 2 weeks.  Discussed could benefit from starting Dupixent injections in future. Patient would like more time to consider.   Return in about 6 weeks (around 12/29/2022) for atopic derm follow up.  Garry Heater, CMA, am acting as scribe for Sarina Ser, MD. Documentation: I have reviewed the above documentation for  accuracy and completeness, and I agree with the above.  Sarina Ser, MD

## 2022-11-17 NOTE — Patient Instructions (Addendum)
For Acne  Continue Benzaclin gel apply to face in the am Continue Retin A 0.1% cream apply to face at bedtime  Topical retinoid medications like tretinoin/Retin-A, adapalene/Differin, tazarotene/Fabior, and Epiduo/Epiduo Forte can cause dryness and irritation when first started. Only apply a pea-sized amount to the entire affected area. Avoid applying it around the eyes, edges of mouth and creases at the nose. If you experience irritation, use a good moisturizer first and/or apply the medicine less often. If you are doing well with the medicine, you can increase how often you use it until you are applying every night. Be careful with sun protection while using this medication as it can make you sensitive to the sun. This medicine should not be used by pregnant women.         Due to recent changes in healthcare laws, you may see results of your pathology and/or laboratory studies on MyChart before the doctors have had a chance to review them. We understand that in some cases there may be results that are confusing or concerning to you. Please understand that not all results are received at the same time and often the doctors may need to interpret multiple results in order to provide you with the best plan of care or course of treatment. Therefore, we ask that you please give [place] 2 business days to thoroughly review all your results before contacting the office for clarification. Should we see a critical lab result, you will be contacted sooner.   If You Need Anything After Your Visit  If you have any questions or concerns for your doctor, please call our main line at [phone] and press option 4 to reach your doctor's medical assistant. If no one answers, please leave a voicemail as directed and we will return your call as soon as possible. Messages left after 4 pm will be answered the following business day.   You may also send [place] a message via [place]. We typically respond to MyChart messages  within 1-2 business days.  For prescription refills, please ask your pharmacy to contact our office. Our fax number is [phone].  If you have an urgent issue when the clinic is closed that cannot wait until the next business day, you can page your doctor at the number below.    Please note that while we do our best to be available for urgent issues outside of office hours, we are not available 24/7.   If you have an urgent issue and are unable to reach [place], you may choose to seek medical care at your doctor's office, retail clinic, urgent care center, or emergency room.  If you have a medical emergency, please immediately call 911 or go to the emergency department.  Pager Numbers  - Dr. Nehemiah Massed: [phone]  - Dr. Laurence Ferrari: [phone]  - Dr. Nicole Kindred: [phone]  In the event of inclement weather, please call our main line at [phone] for an update on the status of any delays or closures.  Dermatology Medication Tips: Please keep the boxes that topical medications come in in order to help keep track of the instructions about where and how to use these. Pharmacies typically print the medication instructions only on the boxes and not directly on the medication tubes.   If your medication is too expensive, please contact our office at [phone] option 4 or send [place] a message through [place].   We are unable to tell what your co-pay for medications will be in advance as this is different  depending on your insurance coverage. However, we may be able to find a substitute medication at lower cost or fill out paperwork to get insurance to cover a needed medication.   If a prior authorization is required to get your medication covered by your insurance company, please allow [place] 1-2 business days to complete this process.  Drug prices often vary depending on where the prescription is filled and some pharmacies may offer cheaper prices.  The website www.goodrx.com contains coupons for  medications through different pharmacies. The prices here do not account for what the cost may be with help from insurance (it may be cheaper with your insurance), but the website can give you the price if you did not use any insurance.  - You can print the associated coupon and take it with your prescription to the pharmacy.  - You may also stop by our office during regular business hours and pick up a GoodRx coupon card.  - If you need your prescription sent electronically to a different pharmacy, notify our office through [facility] or by phone at [phone] option 4.     Si Usted Necesita Algo Despus de Su Visita  Tambin puede enviarnos un mensaje a travs de Pharmacist, community. Por lo general respondemos a los mensajes de MyChart en el transcurso de 1 a 2 das hbiles.  Para renovar recetas, por favor pida a su farmacia que se ponga en contacto con nuestra oficina. Harland Dingwall de fax es [place] [phone].  Si tiene un asunto urgente cuando la clnica est cerrada y que no puede esperar hasta el siguiente da hbil, puede llamar/localizar a su doctor(a) al nmero que aparece a continuacin.   Por favor, tenga en cuenta que aunque hacemos todo lo posible para estar disponibles para asuntos urgentes fuera del horario de [place], no estamos disponibles las 24 horas del da, los 7 das de la [place].   Si tiene un problema urgente y no puede comunicarse con nosotros, puede optar por buscar atencin mdica  en el consultorio de su doctor(a), en una clnica privada, en un centro de atencin urgente o en una sala de emergencias.  Si tiene Engineering geologist, por favor llame inmediatamente al 911 o vaya a la sala de emergencias.  Nmeros de bper  - Dr. Nehemiah Massed: [phone]  - Dra. Moye: [phone]  - Dra. Nicole Kindred: [phone]  En caso de inclemencias del [place], por favor llame a Johnsie Kindred principal al [phone] para una actualizacin sobre el [place] de cualquier retraso o  cierre.  Consejos para la medicacin en dermatologa: Por favor, guarde las cajas en las que vienen los medicamentos de uso tpico para ayudarle a seguir las instrucciones sobre dnde y cmo usarlos. Las farmacias generalmente imprimen las instrucciones del medicamento slo en las cajas y no directamente en los tubos del [place].   Si su medicamento es muy caro, por favor, pngase en contacto con Zigmund Daniel llamando al [phone] y presione la opcin 4 o envenos un mensaje a travs de Pharmacist, community.   No podemos decirle cul ser su copago por los medicamentos por adelantado ya que esto es diferente dependiendo de la cobertura de su seguro. Sin embargo, es posible que podamos encontrar un medicamento sustituto a Electrical engineer un formulario para que el seguro cubra el medicamento que se considera necesario.   Si se requiere una autorizacin previa para que su compaa de seguros [place] su medicamento, por favor permtanos de 1 a 2 das hbiles para [employer]  proceso.  Los precios de los medicamentos varan con frecuencia dependiendo del Environmental consultant de dnde se surte la receta y alguna farmacias pueden ofrecer precios ms baratos.  El sitio web www.goodrx.com tiene cupones para medicamentos de Airline pilot. Los precios aqu no tienen en cuenta lo que podra costar con la ayuda del seguro (puede ser ms barato con su seguro), pero el sitio web puede darle el precio si no utiliz Research scientist (physical sciences).  - Puede imprimir el cupn correspondiente y llevarlo con su receta a la farmacia.  - Tambin puede pasar por nuestra oficina durante el horario de atencin regular y Charity fundraiser una tarjeta de cupones de GoodRx.  - Si necesita que su receta se enve electrnicamente a una farmacia diferente, informe a nuestra oficina a travs de MyChart de [place] o por telfono llamando al [phone] y presione la opcin 4.

## 2022-11-21 ENCOUNTER — Encounter: Payer: Self-pay | Admitting: Nurse Practitioner

## 2022-11-21 ENCOUNTER — Ambulatory Visit: Payer: Medicaid Other | Admitting: Nurse Practitioner

## 2022-11-21 VITALS — BP 125/77 | HR 80 | Temp 97.9°F | Resp 16 | Ht 67.0 in | Wt 226.8 lb

## 2022-11-21 DIAGNOSIS — R7303 Prediabetes: Secondary | ICD-10-CM | POA: Diagnosis not present

## 2022-11-21 DIAGNOSIS — J3089 Other allergic rhinitis: Secondary | ICD-10-CM | POA: Diagnosis not present

## 2022-11-21 DIAGNOSIS — Z6836 Body mass index (BMI) 36.0-36.9, adult: Secondary | ICD-10-CM

## 2022-11-21 MED ORDER — PHENTERMINE HCL 37.5 MG PO TABS: 37.5000 mg | ORAL_TABLET | Freq: Every day | ORAL | 0 refills | Status: DC

## 2022-11-21 NOTE — Progress Notes (Signed)
[facility] [place], [place] [address]  Internal MEDICINE  Office Visit Note  Patient Name: Sandy Burns  [id number]  [id number]  Date of Service: 11/21/2022  Chief Complaint  Patient presents with   Weight Loss    HPI Sandy Burns presents for a follow-up visit for weight loss management Weight loss management -- Lost 5 more lbs. Weight is 226 lbs on office scale today. Was 220 lbs on patient's home scale. Denies any palpitations or other adverse side effects of phentermine.  Allergic rhinitis and sinus problems still bothersome, slight cough and nasal congestion. Sees ENT for allergy shots and allergic rhinitis. Will call them if sinus issues continue.  Prediabetes- her weight loss should improve her A1c. Last check was done in August 2023.     Current Medication: Outpatient Encounter Medications as of 11/21/2022  Medication Sig   amoxicillin-clavulanate (AUGMENTIN) 875-125 MG tablet Take 1 tablet by mouth 2 (two) times daily.   EPIPEN 2-PAK 0.3 MG/0.3ML SOAJ injection Inject 0.3 mg into the muscle as needed for anaphylaxis.   fexofenadine (ALLEGRA) 180 MG tablet Take 1 tablet (180 mg total) by mouth daily.   fluticasone (FLONASE) 50 MCG/ACT nasal spray SHAKE LIQUID AND USE 1 SPRAY IN EACH NOSTRIL DAILY   fluticasone-salmeterol (ADVAIR DISKUS) 100-50 MCG/ACT AEPB INHALE 1 PUFF INTO THE LUNGS TWICE DAILY   montelukast (SINGULAIR) 10 MG tablet TAKE 1 TABLET(10 MG) BY MOUTH AT BEDTIME   Multiple Vitamin (MULTIVITAMIN PO) Take by mouth.   Ruxolitinib Phosphate (OPZELURA) 1.5 % CREA Apply to legs once a day   tretinoin (RETIN-A) 0.1 % cream Pea size amount to face nightly   VENTOLIN HFA 108 (90 Base) MCG/ACT inhaler INHALE 2 PUFFS INTO THE LUNGS EVERY 4 HOURS AS NEEDED FOR WHEEZING OR SHORTNESS OF BREATH   Vitamin D, Ergocalciferol, (DRISDOL) 1.25 MG (50000 UNIT) CAPS capsule Take 1 capsule (50,000 Units total) by mouth every 7 (seven) days.   [DISCONTINUED]  phentermine (ADIPEX-P) 37.5 MG tablet Take 1 tablet (37.5 mg total) by mouth daily before breakfast.   phentermine (ADIPEX-P) 37.5 MG tablet Take 1 tablet (37.5 mg total) by mouth daily before breakfast.   [DISCONTINUED] NIFEdipine (ADALAT CC) 30 MG 24 hr tablet Take 1 tablet (30 mg total) by mouth daily.   No facility-administered encounter medications on file as of 11/21/2022.    Surgical History: Past Surgical History:  Procedure Laterality Date   FOOT SURGERY  2015   REPAIR VAGINAL CUFF N/A 09/06/2020   Procedure: REPAIR VAGINAL CUFF;  Surgeon: Rubie Maid, MD;  Location: [facility] ORS;  Service: Gynecology;  Laterality: N/A;   TOOTH EXTRACTION  2015    Medical History: Past Medical History:  Diagnosis Date   Anemia    Asthma    Eczema    Environmental allergies    History of blood transfusion    after giving birth 09/06/2020    Family History: Family History  Problem Relation Age of Onset   Rheum arthritis Mother    Hypertension Mother    Lupus Mother    Schizophrenia Father     Social History   Socioeconomic History   Marital status: Single    Spouse name: Not on file   Number of children: Not on file   Years of education: Not on file   Highest education level: Not on file  Occupational History   Not on file  Tobacco Use   Smoking status: Never   Smokeless tobacco: Never  Vaping Use  Vaping Use: Never used  Substance and Sexual Activity   Alcohol use: Never    Alcohol/week: 0.0 standard drinks of alcohol   Drug use: Never   Sexual activity: Not Currently    Birth control/protection: I.U.D.  Other Topics Concern   Not on file  Social History Narrative   Not on file   Social Determinants of Health   Financial Resource Strain: Not on file  Food Insecurity: Not on file  Transportation Needs: Not on file  Physical Activity: Not on file  Stress: Not on file  Social Connections: Not on file  Intimate Partner Violence: Not on file      Review of  Systems  Constitutional:  Negative for chills, fatigue and fever.  HENT:  Positive for congestion, postnasal drip and rhinorrhea. Negative for sinus pressure, sinus pain, sneezing and sore throat.   Respiratory:  Positive for shortness of breath (intermittent). Negative for cough, chest tightness and wheezing.   Cardiovascular: Negative.  Negative for chest pain and palpitations.  Gastrointestinal: Negative.  Negative for abdominal pain, constipation, diarrhea, nausea and vomiting.  Musculoskeletal:  Negative for arthralgias.    Vital Signs: BP 125/77   Pulse 80   Temp 97.9 F (36.6 C)   Resp 16   Ht 5' 7"$  (1.702 m)   Wt 226 lb 12.8 oz (102.9 kg)   SpO2 97%   BMI 35.52 kg/m    Physical Exam Vitals reviewed.  Constitutional:      General: She is not in acute distress.    Appearance: Normal appearance. She is obese. She is not ill-appearing.  HENT:     Head: Normocephalic and atraumatic.  Eyes:     Pupils: Pupils are equal, round, and reactive to light.  Cardiovascular:     Rate and Rhythm: Normal rate and regular rhythm.  Pulmonary:     Effort: Pulmonary effort is normal. No respiratory distress.  Neurological:     Mental Status: She is alert and oriented to person, place, and time.  Psychiatric:        Mood and Affect: Mood normal.        Behavior: Behavior normal.        Assessment/Plan: 1. Prediabetes Continue phentermine, will check A1c at next office visit. - phentermine (ADIPEX-P) 37.5 MG tablet; Take 1 tablet (37.5 mg total) by mouth daily before breakfast.  Dispense: 30 tablet; Refill: 0  2. Non-seasonal allergic rhinitis due to other allergic trigger Followed by ENT  3. Class 2 severe obesity due to excess calories with serious comorbidity and body mass index (BMI) of 36.0 to 36.9 in adult [facility]) Continue phentermine for another month, follow up in 4 weeks - phentermine (ADIPEX-P) 37.5 MG tablet; Take 1 tablet (37.5 mg total) by mouth daily before  breakfast.  Dispense: 30 tablet; Refill: 0   General Counseling: Sandy Burns verbalizes understanding of the findings of todays visit and agrees with plan of treatment. I have discussed any further diagnostic evaluation that may be needed or ordered today. We also reviewed her medications today. she has been encouraged to call the office with any questions or concerns that should arise related to todays visit.    No orders of the defined types were placed in this encounter.   Meds ordered this encounter  Medications   phentermine (ADIPEX-P) 37.5 MG tablet    Sig: Take 1 tablet (37.5 mg total) by mouth daily before breakfast.    Dispense:  30 tablet    Refill:  0  Dx R73.03 and E66.01. do not run through insurance, pt will bring goodrx coupon    Return in about 4 weeks (around 12/19/2022) for F/U, Weight loss, Sandy Burns PCP.   Total time spent:30 Minutes Time spent includes review of chart, medications, test results, and follow up plan with the patient.   [place] Controlled Substance Database was reviewed by me.  This patient was seen by Jonetta Osgood, FNP-C in collaboration with Dr. Clayborn Bigness as a part of collaborative care agreement.   Kasen Sako R. Valetta Fuller, MSN, FNP-C Internal medicine

## 2022-11-22 ENCOUNTER — Telehealth: Payer: Self-pay | Admitting: Obstetrics and Gynecology

## 2022-11-22 NOTE — Telephone Encounter (Signed)
Lm for pt to call office back to r/s appt on 12/19/22 with DJE he will be in surgery that day.

## 2022-11-24 DIAGNOSIS — J301 Allergic rhinitis due to pollen: Secondary | ICD-10-CM | POA: Diagnosis not present

## 2022-11-29 ENCOUNTER — Encounter: Payer: Self-pay | Admitting: Dermatology

## 2022-11-30 DIAGNOSIS — J301 Allergic rhinitis due to pollen: Secondary | ICD-10-CM | POA: Diagnosis not present

## 2022-12-05 DIAGNOSIS — J301 Allergic rhinitis due to pollen: Secondary | ICD-10-CM | POA: Diagnosis not present

## 2022-12-07 ENCOUNTER — Other Ambulatory Visit: Payer: Self-pay | Admitting: Nurse Practitioner

## 2022-12-07 DIAGNOSIS — E559 Vitamin D deficiency, unspecified: Secondary | ICD-10-CM

## 2022-12-07 DIAGNOSIS — R070 Pain in throat: Secondary | ICD-10-CM | POA: Diagnosis not present

## 2022-12-07 DIAGNOSIS — J0300 Acute streptococcal tonsillitis, unspecified: Secondary | ICD-10-CM | POA: Diagnosis not present

## 2022-12-07 NOTE — Telephone Encounter (Signed)
The patient is schedule for 3/21 with DJE

## 2022-12-15 DIAGNOSIS — J020 Streptococcal pharyngitis: Secondary | ICD-10-CM | POA: Diagnosis not present

## 2022-12-19 ENCOUNTER — Encounter: Payer: Self-pay | Admitting: Obstetrics and Gynecology

## 2022-12-19 DIAGNOSIS — J301 Allergic rhinitis due to pollen: Secondary | ICD-10-CM | POA: Diagnosis not present

## 2022-12-20 ENCOUNTER — Encounter: Payer: Self-pay | Admitting: Nurse Practitioner

## 2022-12-20 ENCOUNTER — Ambulatory Visit: Payer: Medicaid Other | Admitting: Nurse Practitioner

## 2022-12-20 VITALS — BP 117/81 | Temp 98.2°F | Resp 16 | Ht 67.0 in | Wt 224.0 lb

## 2022-12-20 DIAGNOSIS — R7303 Prediabetes: Secondary | ICD-10-CM | POA: Diagnosis not present

## 2022-12-20 DIAGNOSIS — J4520 Mild intermittent asthma, uncomplicated: Secondary | ICD-10-CM

## 2022-12-20 DIAGNOSIS — Z6836 Body mass index (BMI) 36.0-36.9, adult: Secondary | ICD-10-CM

## 2022-12-20 MED ORDER — PHENTERMINE HCL 37.5 MG PO TABS: 37.5000 mg | ORAL_TABLET | Freq: Every day | ORAL | 0 refills | Status: DC

## 2022-12-20 NOTE — Progress Notes (Signed)
[facility] [place], [place] [address]  Internal MEDICINE  Office Visit Note  Patient Name: Sandy Burns  [id number]  [id number]  Date of Service: 12/20/2022  Chief Complaint  Patient presents with   Follow-up    Weight loss    HPI Kahlen presents for a follow-up visit for weight loss, prediabetes, and asthma.  Weight loss -- lost 2 more lbs, wants to continue phentermine for another month.  Prediabetes -- still losing more weight which will continue to improve her A1c.  Asthma -- stable, no issues.     Current Medication: Outpatient Encounter Medications as of 12/20/2022  Medication Sig   amoxicillin-clavulanate (AUGMENTIN) 875-125 MG tablet Take 1 tablet by mouth 2 (two) times daily.   EPIPEN 2-PAK 0.3 MG/0.3ML SOAJ injection Inject 0.3 mg into the muscle as needed for anaphylaxis.   fexofenadine (ALLEGRA) 180 MG tablet Take 1 tablet (180 mg total) by mouth daily.   fluticasone (FLONASE) 50 MCG/ACT nasal spray SHAKE LIQUID AND USE 1 SPRAY IN EACH NOSTRIL DAILY   fluticasone-salmeterol (ADVAIR DISKUS) 100-50 MCG/ACT AEPB INHALE 1 PUFF INTO THE LUNGS TWICE DAILY   montelukast (SINGULAIR) 10 MG tablet TAKE 1 TABLET(10 MG) BY MOUTH AT BEDTIME   Multiple Vitamin (MULTIVITAMIN PO) Take by mouth.   Ruxolitinib Phosphate (OPZELURA) 1.5 % CREA Apply to legs once a day   tretinoin (RETIN-A) 0.1 % cream Pea size amount to face nightly   VENTOLIN HFA 108 (90 Base) MCG/ACT inhaler INHALE 2 PUFFS INTO THE LUNGS EVERY 4 HOURS AS NEEDED FOR WHEEZING OR SHORTNESS OF BREATH   Vitamin D, Ergocalciferol, (DRISDOL) 1.25 MG (50000 UNIT) CAPS capsule TAKE 1 CAPSULE BY MOUTH EVERY 7 DAYS   [DISCONTINUED] phentermine (ADIPEX-P) 37.5 MG tablet Take 1 tablet (37.5 mg total) by mouth daily before breakfast.   phentermine (ADIPEX-P) 37.5 MG tablet Take 1 tablet (37.5 mg total) by mouth daily before breakfast.   [DISCONTINUED] NIFEdipine (ADALAT CC) 30 MG 24 hr tablet Take 1  tablet (30 mg total) by mouth daily.   No facility-administered encounter medications on file as of 12/20/2022.    Surgical History: Past Surgical History:  Procedure Laterality Date   FOOT SURGERY  2015   REPAIR VAGINAL CUFF N/A 09/06/2020   Procedure: REPAIR VAGINAL CUFF;  Surgeon: Rubie Maid, MD;  Location: [facility] ORS;  Service: Gynecology;  Laterality: N/A;   TOOTH EXTRACTION  2015    Medical History: Past Medical History:  Diagnosis Date   Anemia    Asthma    Eczema    Environmental allergies    History of blood transfusion    after giving birth 09/06/2020    Family History: Family History  Problem Relation Age of Onset   Rheum arthritis Mother    Hypertension Mother    Lupus Mother    Schizophrenia Father     Social History   Socioeconomic History   Marital status: Single    Spouse name: Not on file   Number of children: Not on file   Years of education: Not on file   Highest education level: Not on file  Occupational History   Not on file  Tobacco Use   Smoking status: Never   Smokeless tobacco: Never  Vaping Use   Vaping Use: Never used  Substance and Sexual Activity   Alcohol use: Never    Alcohol/week: 0.0 standard drinks of alcohol   Drug use: Never   Sexual activity: Not Currently    Birth control/protection:  I.U.D.  Other Topics Concern   Not on file  Social History Narrative   Not on file   Social Determinants of Health   Financial Resource Strain: Not on file  Food Insecurity: Not on file  Transportation Needs: Not on file  Physical Activity: Not on file  Stress: Not on file  Social Connections: Not on file  Intimate Partner Violence: Not on file      Review of Systems  Constitutional:  Negative for chills, fatigue and fever.  HENT:  Positive for postnasal drip. Negative for congestion, rhinorrhea, sinus pressure, sinus pain, sneezing and sore throat.   Respiratory:  Positive for shortness of breath (intermittent). Negative  for cough, chest tightness and wheezing.   Cardiovascular: Negative.  Negative for chest pain and palpitations.  Gastrointestinal: Negative.  Negative for abdominal pain, constipation, diarrhea, nausea and vomiting.  Musculoskeletal:  Negative for arthralgias.    Vital Signs: BP 117/81   Temp 98.2 F (36.8 C)   Resp 16   Ht '5\' 7"'$  (1.702 m)   Wt 224 lb (101.6 kg)   BMI 35.08 kg/m    Physical Exam Vitals reviewed.  Constitutional:      General: She is not in acute distress.    Appearance: Normal appearance. She is obese. She is not ill-appearing.  HENT:     Head: Normocephalic and atraumatic.  Eyes:     Pupils: Pupils are equal, round, and reactive to light.  Cardiovascular:     Rate and Rhythm: Normal rate and regular rhythm.  Pulmonary:     Effort: Pulmonary effort is normal. No respiratory distress.  Neurological:     Mental Status: She is alert and oriented to person, place, and time.  Psychiatric:        Mood and Affect: Mood normal.        Behavior: Behavior normal.        Assessment/Plan: 1. Prediabetes Continue phentermine, weight loss will help improve A1c and glucose as well.  - phentermine (ADIPEX-P) 37.5 MG tablet; Take 1 tablet (37.5 mg total) by mouth daily before breakfast.  Dispense: 30 tablet; Refill: 0  2. Mild intermittent asthma without complication Stable, no interventions at this time.   3. Class 2 severe obesity due to excess calories with serious comorbidity and body mass index (BMI) of 36.0 to 36.9 in adult [facility]) Continue phentermine x1 month, follow up in 4 weeks.  - phentermine (ADIPEX-P) 37.5 MG tablet; Take 1 tablet (37.5 mg total) by mouth daily before breakfast.  Dispense: 30 tablet; Refill: 0   General Counseling: Lirio verbalizes understanding of the findings of todays visit and agrees with plan of treatment. I have discussed any further diagnostic evaluation that may be needed or ordered today. We also reviewed her medications  today. she has been encouraged to call the office with any questions or concerns that should arise related to todays visit.    No orders of the defined types were placed in this encounter.   Meds ordered this encounter  Medications   phentermine (ADIPEX-P) 37.5 MG tablet    Sig: Take 1 tablet (37.5 mg total) by mouth daily before breakfast.    Dispense:  30 tablet    Refill:  0    Dx R73.03 and E66.01. do not run through insurance, pt will bring goodrx coupon    Return in about 4 weeks (around 01/17/2023) for F/U, Weight loss, Shabnam Ladd PCP.   Total time spent:30 Minutes Time spent includes review of chart, medications,  test results, and follow up plan with the patient.   [place] Controlled Substance Database was reviewed by me.  This patient was seen by Jonetta Osgood, FNP-C in collaboration with Dr. Clayborn Bigness as a part of collaborative care agreement.   Kingston Guiles R. Valetta Fuller, MSN, FNP-C Internal medicine

## 2022-12-26 DIAGNOSIS — J301 Allergic rhinitis due to pollen: Secondary | ICD-10-CM | POA: Diagnosis not present

## 2022-12-29 ENCOUNTER — Encounter: Payer: Self-pay | Admitting: Obstetrics and Gynecology

## 2022-12-29 ENCOUNTER — Other Ambulatory Visit (HOSPITAL_COMMUNITY)
Admission: RE | Admit: 2022-12-29 | Discharge: 2022-12-29 | Disposition: A | Discharge status: Home / Self Care | Payer: Medicaid Other | Source: Ambulatory Visit | Attending: Obstetrics and Gynecology | Admitting: Obstetrics and Gynecology

## 2022-12-29 ENCOUNTER — Ambulatory Visit (INDEPENDENT_AMBULATORY_CARE_PROVIDER_SITE_OTHER): Payer: Medicaid Other | Admitting: Obstetrics and Gynecology

## 2022-12-29 VITALS — BP 120/77 | HR 92 | Ht 67.0 in | Wt 219.6 lb

## 2022-12-29 DIAGNOSIS — Z113 Encounter for screening for infections with a predominantly sexual mode of transmission: Secondary | ICD-10-CM | POA: Diagnosis present

## 2022-12-29 DIAGNOSIS — Z01419 Encounter for gynecological examination (general) (routine) without abnormal findings: Secondary | ICD-10-CM

## 2022-12-29 DIAGNOSIS — Z975 Presence of (intrauterine) contraceptive device: Secondary | ICD-10-CM

## 2022-12-29 DIAGNOSIS — Z30431 Encounter for routine checking of intrauterine contraceptive device: Secondary | ICD-10-CM

## 2022-12-29 NOTE — Progress Notes (Signed)
HPI:      [name] is a 22 y.o. G1P1001 who LMP was Patient's last menstrual period was 11/20/2022.  Subjective:   She presents today for her annual examination.  She has an IUD in place.  She occasionally has menstrual periods with it.  She is generally happy with her IUD and would like to keep it.    Hx: The following portions of the patient's history were reviewed and updated as appropriate:             She  has a past medical history of Anemia, Asthma, Eczema, Environmental allergies, and History of blood transfusion. She does not have any pertinent problems on file. She  has a past surgical history that includes Foot surgery (2015); Tooth extraction (2015); and Repair vaginal cuff (N/A, 09/06/2020). Her family history includes Hypertension in her mother; Lupus in her mother; Rheum arthritis in her mother; Schizophrenia in her father. She  reports that she has never smoked. She has never used smokeless tobacco. She reports that she does not drink alcohol and does not use drugs. She has a current medication list which includes the following prescription(s): amoxicillin-clavulanate, epipen 2-pak, fexofenadine, fluticasone, fluticasone-salmeterol, montelukast, multiple vitamin, phentermine, opzelura, tretinoin, ventolin hfa, vitamin d (ergocalciferol), and [DISCONTINUED] nifedipine. She is allergic to other, cats claw (uncaria tomentosa), dust mite extract, molds & smuts, and tree extract.       Review of Systems:  Review of Systems  Constitutional: Denied constitutional symptoms, night sweats, recent illness, fatigue, fever, insomnia and weight loss.  Eyes: Denied eye symptoms, eye pain, photophobia, vision change and visual disturbance.  Ears/Nose/Throat/Neck: Denied ear, nose, throat or neck symptoms, hearing loss, nasal discharge, sinus congestion and sore throat.  Cardiovascular: Denied cardiovascular symptoms, arrhythmia, chest pain/pressure, edema, exercise intolerance,  orthopnea and palpitations.  Respiratory: Denied pulmonary symptoms, asthma, pleuritic pain, productive sputum, cough, dyspnea and wheezing.  Gastrointestinal: Denied, gastro-esophageal reflux, melena, nausea and vomiting.  Genitourinary: Denied genitourinary symptoms including symptomatic vaginal discharge, pelvic relaxation issues, and urinary complaints.  Musculoskeletal: Denied musculoskeletal symptoms, stiffness, swelling, muscle weakness and myalgia.  Dermatologic: Denied dermatology symptoms, rash and scar.  Neurologic: Denied neurology symptoms, dizziness, headache, neck pain and syncope.  Psychiatric: Denied psychiatric symptoms, anxiety and depression.  Endocrine: Denied endocrine symptoms including hot flashes and night sweats.   Meds:   Current Outpatient Medications on File Prior to Visit  Medication Sig Dispense Refill   amoxicillin-clavulanate (AUGMENTIN) 875-125 MG tablet Take 1 tablet by mouth 2 (two) times daily. 20 tablet 0   EPIPEN 2-PAK 0.3 MG/0.3ML SOAJ injection Inject 0.3 mg into the muscle as needed for anaphylaxis. 1 each 0   fexofenadine (ALLEGRA) 180 MG tablet Take 1 tablet (180 mg total) by mouth daily. 30 tablet 2   fluticasone (FLONASE) 50 MCG/ACT nasal spray SHAKE LIQUID AND USE 1 SPRAY IN EACH NOSTRIL DAILY 16 g 5   fluticasone-salmeterol (ADVAIR DISKUS) 100-50 MCG/ACT AEPB INHALE 1 PUFF INTO THE LUNGS TWICE DAILY 60 each 11   montelukast (SINGULAIR) 10 MG tablet TAKE 1 TABLET(10 MG) BY MOUTH AT BEDTIME 90 tablet 3   Multiple Vitamin (MULTIVITAMIN PO) Take by mouth.     phentermine (ADIPEX-P) 37.5 MG tablet Take 1 tablet (37.5 mg total) by mouth daily before breakfast. 30 tablet 0   Ruxolitinib Phosphate (OPZELURA) 1.5 % CREA Apply to legs once a day 60 g 2   tretinoin (RETIN-A) 0.1 % cream Pea size amount to face nightly 45 g 11  VENTOLIN HFA 108 (90 Base) MCG/ACT inhaler INHALE 2 PUFFS INTO THE LUNGS EVERY 4 HOURS AS NEEDED FOR WHEEZING OR SHORTNESS OF  BREATH 18 g 3   Vitamin D, Ergocalciferol, (DRISDOL) 1.25 MG (50000 UNIT) CAPS capsule TAKE 1 CAPSULE BY MOUTH EVERY 7 DAYS 12 capsule 1   [DISCONTINUED] NIFEdipine (ADALAT CC) 30 MG 24 hr tablet Take 1 tablet (30 mg total) by mouth daily. 60 tablet 0   No current facility-administered medications on file prior to visit.     Objective:     Vitals:   12/29/22 0936  BP: 120/77  Pulse: 92    Filed Weights   12/29/22 0936  Weight: 219 lb 9.6 oz (99.6 kg)              Physical examination General NAD, Conversant  HEENT Atraumatic; Op clear with mmm.  Normo-cephalic. Pupils reactive. Anicteric sclerae  Thyroid/Neck Smooth without nodularity or enlargement. Normal ROM.  Neck Supple.  Skin No rashes, lesions or ulceration. Normal palpated skin turgor. No nodularity.  Breasts: No masses or discharge.  Symmetric.  No axillary adenopathy.  Lungs: Clear to auscultation.No rales or wheezes. Normal Respiratory effort, no retractions.  Heart: NSR.  No murmurs or rubs appreciated. No peripheral edema  Abdomen: Soft.  Non-tender.  No masses.  No HSM. No hernia  Extremities: Moves all appropriately.  Normal ROM for age. No lymphadenopathy.  Neuro: Oriented to PPT.  Normal mood. Normal affect.     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 Well woman exam with routine gynecological exam   2. Surveillance of previously prescribed intrauterine contraceptive device   3. Screening for STD (sexually transmitted  disease)     Normal exam-patient up-to-date on Pap smears.   Plan:            1.  Basic Screening Recommendations The basic screening recommendations for asymptomatic women were discussed with the patient during her visit.  The age-appropriate recommendations were discussed with her and the rational for the tests reviewed.  When I am informed by the patient that another primary care physician has previously obtained the age-appropriate tests and they are up-to-date, only outstanding tests are ordered and referrals given as necessary.  Abnormal results of tests will be discussed with her when all of her results are completed.  Routine preventative health maintenance measures emphasized: Exercise/Diet/Weight control, Tobacco Warnings, Alcohol/Substance use risks and Stress Management GC/CT performed Orders No orders of the defined types were placed in this encounter.   No orders of the defined types were placed in this encounter.         F/U  Return in about 1 year (around 12/29/2023) for Annual Physical.  Finis Bud, M.D. 12/29/2022 9:49 AM

## 2022-12-29 NOTE — Progress Notes (Signed)
Patients presents for annual exam today. She states doing well with current IUD. She is up to date on pap smear. She states no other questions or concerns at this time.

## 2022-12-30 LAB — URINE CYTOLOGY ANCILLARY ONLY
Chlamydia: NEGATIVE
Comment: NEGATIVE
Comment: NEGATIVE
Comment: NORMAL
Neisseria Gonorrhea: NEGATIVE
Trichomonas: NEGATIVE

## 2023-01-02 DIAGNOSIS — J301 Allergic rhinitis due to pollen: Secondary | ICD-10-CM | POA: Diagnosis not present

## 2023-01-09 DIAGNOSIS — J301 Allergic rhinitis due to pollen: Secondary | ICD-10-CM | POA: Diagnosis not present

## 2023-01-11 ENCOUNTER — Encounter: Payer: Self-pay | Admitting: Dermatology

## 2023-01-11 ENCOUNTER — Ambulatory Visit (INDEPENDENT_AMBULATORY_CARE_PROVIDER_SITE_OTHER): Payer: Medicaid Other | Admitting: Dermatology

## 2023-01-11 VITALS — BP 121/82 | HR 72

## 2023-01-11 DIAGNOSIS — Z79899 Other long term (current) drug therapy: Secondary | ICD-10-CM

## 2023-01-11 DIAGNOSIS — L299 Pruritus, unspecified: Secondary | ICD-10-CM

## 2023-01-11 DIAGNOSIS — L906 Striae atrophicae: Secondary | ICD-10-CM | POA: Diagnosis not present

## 2023-01-11 DIAGNOSIS — L700 Acne vulgaris: Secondary | ICD-10-CM | POA: Diagnosis not present

## 2023-01-11 DIAGNOSIS — L2089 Other atopic dermatitis: Secondary | ICD-10-CM | POA: Diagnosis not present

## 2023-01-11 DIAGNOSIS — L819 Disorder of pigmentation, unspecified: Secondary | ICD-10-CM | POA: Diagnosis not present

## 2023-01-11 MED ORDER — WINLEVI 1 % EX CREA: TOPICAL_CREAM | CUTANEOUS | 2 refills | Status: DC

## 2023-01-11 NOTE — Progress Notes (Deleted)
   Follow-Up Visit   Subjective  Sandy Burns is a 22 y.o. female who presents for the following: 2 months f/u on at   The following portions of the chart were reviewed this encounter and updated as appropriate: medications, allergies, medical history  Review of Systems:  No other skin or systemic complaints except as noted in HPI or Assessment and Plan.  Objective  Well appearing patient in no apparent distress; mood and affect are within normal limits.  ***A full examination was performed including scalp, head, eyes, ears, nose, lips, neck, chest, axillae, abdomen, back, buttocks, bilateral upper extremities, bilateral lower extremities, hands, feet, fingers, toes, fingernails, and toenails. All findings within normal limits unless otherwise noted below.  ***A focused examination was performed of the following areas: ***  Relevant exam findings are noted in the Assessment and Plan.    Assessment & Plan       No follow-ups on file.  IMarye Round, CMA, am acting as scribe for Sarina Ser, MD .   Documentation: I have reviewed the above documentation for accuracy and completeness, and I agree with the above.  Sarina Ser, MD

## 2023-01-11 NOTE — Progress Notes (Signed)
   Follow-Up Visit   Subjective  Sandy Burns is a 22 y.o. female who presents for the following: 2 months f/u Atopic Dermatitis on the body treating with Mometasone cream and Opzelura cream with a good response.  Acne is improving on treatment.  The following portions of the chart were reviewed this encounter and updated as appropriate: medications, allergies, medical history  Review of Systems:  No other skin or systemic complaints except as noted in HPI or Assessment and Plan.  Objective  Well appearing patient in no apparent distress; mood and affect are within normal limits.  Areas Examined: Arms, legs,chest   Relevant physical exam findings are noted in the Assessment and Plan.   Assessment & Plan   ATOPIC DERMATITIS Exam: Scaly pink papules coalescing to plaques Location: arms, legs, Chronic and persistent condition with duration or expected duration over one year. Condition is symptomatic/ bothersome to patient. Not currently at goal.   Atopic dermatitis (eczema) is a chronic, relapsing, pruritic condition that can significantly affect quality of life. It is often associated with allergic rhinitis and/or asthma and can require treatment with topical medications, phototherapy, or in severe cases biologic injectable medication (Dupixent; Adbry) or Oral JAK inhibitors.  Treatment Plan: antecubital areas, legs, face With dyschromia and pruritus   Chronic and persistent condition with duration or expected duration over one year. Condition is symptomatic / bothersome to patient. Not to goal, but improving. Still itchy   Atopic dermatitis (eczema) is a chronic, relapsing, pruritic condition that can significantly affect quality of life. It is often associated with allergic rhinitis and/or asthma and can require treatment with topical medications, phototherapy, or in severe cases biologic injectable medication (Dupixent; Adbry) or Oral JAK inhibitors.    Continue Eucrisa  ointment daily  Continue Opzelura cream apply to legs once a day Consider Dupixent if worsens in future. Recommend gentle skin care.  STRIAE- Location- arms, legs   Start started as child- avoid steroid cream to arms and legs  ACNE VULGARIS Exam: 1 active papule  Location: face   Chronic and persistent condition with duration or expected duration over one year. Condition is symptomatic/ bothersome to patient. Not currently at goal, but improving.  Treatment Plan:  Start Winlevi apply to face at bedtime  Continue Benzaclin gel apply to face in the am Continue Retin A 0.1% cream apply to face at bedtime   Topical retinoid medications like tretinoin/Retin-A, adapalene/Differin, tazarotene/Fabior, and Epiduo/Epiduo Forte can cause dryness and irritation when first started. Only apply a pea-sized amount to the entire affected area. Avoid applying it around the eyes, edges of mouth and creases at the nose. If you experience irritation, use a good moisturizer first and/or apply the medicine less often. If you are doing well with the medicine, you can increase how often you use it until you are applying every night. Be careful with sun protection while using this medication as it can make you sensitive to the sun. This medicine should not be used by pregnant women.   Return in about 6 months (around 07/13/2023) for Atopic dermatitis, Acne .  IMarye Round, CMA, am acting as scribe for Sarina Ser, MD .   Documentation: I have reviewed the above documentation for accuracy and completeness, and I agree with the above.  Sarina Ser, MD

## 2023-01-11 NOTE — Patient Instructions (Signed)
Due to recent changes in healthcare laws, you may see results of your pathology and/or laboratory studies on MyChart before the doctors have had a chance to review them. We understand that in some cases there may be results that are confusing or concerning to you. Please understand that not all results are received at the same time and often the doctors may need to interpret multiple results in order to provide you with the best plan of care or course of treatment. Therefore, we ask that you please give us 2 business days to thoroughly review all your results before contacting the office for clarification. Should we see a critical lab result, you will be contacted sooner.   If You Need Anything After Your Visit  If you have any questions or concerns for your doctor, please call our main line at [phone] and press option 4 to reach your doctor's medical assistant. If no one answers, please leave a voicemail as directed and we will return your call as soon as possible. Messages left after 4 pm will be answered the following business day.   You may also send us a message via MyChart. We typically respond to MyChart messages within 1-2 business days.  For prescription refills, please ask your pharmacy to contact our office. Our fax number is [phone].  If you have an urgent issue when the clinic is closed that cannot wait until the next business day, you can page your doctor at the number below.    Please note that while we do our best to be available for urgent issues outside of office hours, we are not available 24/7.   If you have an urgent issue and are unable to reach us, you may choose to seek medical care at your doctor's office, retail clinic, urgent care center, or emergency room.  If you have a medical emergency, please immediately call 911 or go to the emergency department.  Pager Numbers  - Dr. Kowalski: [phone]  - Dr. Moye: [phone]  - Dr. Stewart:  [phone]  In the event of inclement weather, please call our main line at [phone] for an update on the status of any delays or closures.  Dermatology Medication Tips: Please keep the boxes that topical medications come in in order to help keep track of the instructions about where and how to use these. Pharmacies typically print the medication instructions only on the boxes and not directly on the medication tubes.   If your medication is too expensive, please contact our office at [phone] option 4 or send us a message through MyChart.   We are unable to tell what your co-pay for medications will be in advance as this is different depending on your insurance coverage. However, we may be able to find a substitute medication at lower cost or fill out paperwork to get insurance to cover a needed medication.   If a prior authorization is required to get your medication covered by your insurance company, please allow us 1-2 business days to complete this process.  Drug prices often vary depending on where the prescription is filled and some pharmacies may offer cheaper prices.  The website www.goodrx.com contains coupons for medications through different pharmacies. The prices here do not account for what the cost may be with help from insurance (it may be cheaper with your insurance), but the website can give you the price if you did not use any insurance.  - You can print the associated coupon and take it with   your prescription to the pharmacy.  - You may also stop by our office during regular business hours and pick up a GoodRx coupon card.  - If you need your prescription sent electronically to a different pharmacy, notify our office through [facility] MyChart or by phone at [phone] option 4.     Si Usted Necesita Algo Despus de Su Visita  Tambin puede enviarnos un mensaje a travs de MyChart. Por lo general respondemos a los mensajes de MyChart en el transcurso de 1 a 2  das hbiles.  Para renovar recetas, por favor pida a su farmacia que se ponga en contacto con nuestra oficina. Nuestro nmero de fax es el [phone].  Si tiene un asunto urgente cuando la clnica est cerrada y que no puede esperar hasta el siguiente da hbil, puede llamar/localizar a su doctor(a) al nmero que aparece a continuacin.   Por favor, tenga en cuenta que aunque hacemos todo lo posible para estar disponibles para asuntos urgentes fuera del horario de oficina, no estamos disponibles las 24 horas del da, los 7 das de la semana.   Si tiene un problema urgente y no puede comunicarse con nosotros, puede optar por buscar atencin mdica  en el consultorio de su doctor(a), en una clnica privada, en un centro de atencin urgente o en una sala de emergencias.  Si tiene una emergencia mdica, por favor llame inmediatamente al 911 o vaya a la sala de emergencias.  Nmeros de bper  - Dr. Kowalski: [phone]  - Dra. Moye: [phone]  - Dra. Stewart: [phone]  En caso de inclemencias del tiempo, por favor llame a nuestra lnea principal al [phone] para una actualizacin sobre el estado de cualquier retraso o cierre.  Consejos para la medicacin en dermatologa: Por favor, guarde las cajas en las que vienen los medicamentos de uso tpico para ayudarle a seguir las instrucciones sobre dnde y cmo usarlos. Las farmacias generalmente imprimen las instrucciones del medicamento slo en las cajas y no directamente en los tubos del medicamento.   Si su medicamento es muy caro, por favor, pngase en contacto con nuestra oficina llamando al [phone] y presione la opcin 4 o envenos un mensaje a travs de MyChart.   No podemos decirle cul ser su copago por los medicamentos por adelantado ya que esto es diferente dependiendo de la cobertura de su seguro. Sin embargo, es posible que podamos encontrar un medicamento sustituto a menor costo o llenar un formulario para que el  seguro cubra el medicamento que se considera necesario.   Si se requiere una autorizacin previa para que su compaa de seguros cubra su medicamento, por favor permtanos de 1 a 2 das hbiles para completar este proceso.  Los precios de los medicamentos varan con frecuencia dependiendo del lugar de dnde se surte la receta y alguna farmacias pueden ofrecer precios ms baratos.  El sitio web www.goodrx.com tiene cupones para medicamentos de diferentes farmacias. Los precios aqu no tienen en cuenta lo que podra costar con la ayuda del seguro (puede ser ms barato con su seguro), pero el sitio web puede darle el precio si no utiliz ningn seguro.  - Puede imprimir el cupn correspondiente y llevarlo con su receta a la farmacia.  - Tambin puede pasar por nuestra oficina durante el horario de atencin regular y recoger una tarjeta de cupones de GoodRx.  - Si necesita que su receta se enve electrnicamente a una farmacia diferente, informe a nuestra oficina a travs de MyChart de [place]   o por telfono llamando al [phone] y presione la opcin 4.  

## 2023-01-16 DIAGNOSIS — J301 Allergic rhinitis due to pollen: Secondary | ICD-10-CM | POA: Diagnosis not present

## 2023-01-19 ENCOUNTER — Ambulatory Visit: Payer: Medicaid Other | Admitting: Physician Assistant

## 2023-01-21 DIAGNOSIS — J069 Acute upper respiratory infection, unspecified: Secondary | ICD-10-CM | POA: Diagnosis not present

## 2023-01-23 DIAGNOSIS — J301 Allergic rhinitis due to pollen: Secondary | ICD-10-CM | POA: Diagnosis not present

## 2023-01-30 DIAGNOSIS — J301 Allergic rhinitis due to pollen: Secondary | ICD-10-CM | POA: Diagnosis not present

## 2023-02-13 DIAGNOSIS — J301 Allergic rhinitis due to pollen: Secondary | ICD-10-CM | POA: Diagnosis not present

## 2023-02-20 DIAGNOSIS — J301 Allergic rhinitis due to pollen: Secondary | ICD-10-CM | POA: Diagnosis not present

## 2023-02-21 DIAGNOSIS — J301 Allergic rhinitis due to pollen: Secondary | ICD-10-CM | POA: Diagnosis not present

## 2023-02-27 DIAGNOSIS — J301 Allergic rhinitis due to pollen: Secondary | ICD-10-CM | POA: Diagnosis not present

## 2023-03-05 ENCOUNTER — Encounter: Payer: Self-pay | Admitting: Obstetrics and Gynecology

## 2023-03-08 DIAGNOSIS — J301 Allergic rhinitis due to pollen: Secondary | ICD-10-CM | POA: Diagnosis not present

## 2023-03-09 ENCOUNTER — Ambulatory Visit: Payer: Medicaid Other | Admitting: Nurse Practitioner

## 2023-03-09 ENCOUNTER — Encounter: Payer: Self-pay | Admitting: Nurse Practitioner

## 2023-03-09 VITALS — BP 128/74 | HR 89 | Temp 98.3°F | Resp 16 | Ht 67.0 in | Wt 221.0 lb

## 2023-03-09 DIAGNOSIS — R7303 Prediabetes: Secondary | ICD-10-CM

## 2023-03-09 DIAGNOSIS — F320 Major depressive disorder, single episode, mild: Secondary | ICD-10-CM | POA: Diagnosis not present

## 2023-03-09 DIAGNOSIS — Z6836 Body mass index (BMI) 36.0-36.9, adult: Secondary | ICD-10-CM | POA: Diagnosis not present

## 2023-03-09 MED ORDER — PHENTERMINE HCL 37.5 MG PO TABS: 37.5000 mg | ORAL_TABLET | Freq: Every day | ORAL | 0 refills | Status: DC

## 2023-03-09 MED ORDER — SERTRALINE HCL 25 MG PO TABS: 25.0000 mg | ORAL_TABLET | Freq: Every day | ORAL | 3 refills | Status: DC

## 2023-03-09 NOTE — Progress Notes (Signed)
[facility] [address] [place], [place] [address]  Internal MEDICINE  Office Visit Note  Patient Name: Sandy Burns  [id number]  [id number]  Date of Service: 03/09/2023  Chief Complaint  Patient presents with   Follow-up    Weight loss     HPI Sandy Burns presents for a follow-up visit for weight loss and prediabetes Prediabetes -- working on losing weight  Weight loss -- lost 3 more lbs. Doing well, decreased appetite. 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 She does not do any additional exercise. She wants to continue phentermine Depression -- reports depressed mood, hypersomnia, anhedonia, and is wanting to try an antidepressant    Current Medication: Outpatient Encounter Medications as of 03/09/2023  Medication Sig   Clascoterone (WINLEVI) 1 % CREA Apply to face at bedtime   EPIPEN 2-PAK 0.3 MG/0.3ML SOAJ injection Inject 0.3 mg into the muscle as needed for anaphylaxis.   fexofenadine (ALLEGRA) 180 MG tablet Take 1 tablet (180 mg total) by mouth daily.   fluticasone (FLONASE) 50 MCG/ACT nasal spray SHAKE LIQUID AND USE 1 SPRAY IN EACH NOSTRIL DAILY   fluticasone-salmeterol (ADVAIR DISKUS) 100-50 MCG/ACT AEPB INHALE 1 PUFF INTO THE LUNGS TWICE DAILY   montelukast (SINGULAIR) 10 MG tablet TAKE 1 TABLET(10 MG) BY MOUTH AT BEDTIME   Multiple Vitamin (MULTIVITAMIN PO) Take by mouth.   Ruxolitinib Phosphate (OPZELURA) 1.5 % CREA Apply to legs once a day   sertraline (ZOLOFT) 25 MG tablet Take 1 tablet (25 mg total) by mouth daily.   tretinoin (RETIN-A) 0.1 % cream Pea size amount to face nightly   VENTOLIN HFA 108 (90 Base) MCG/ACT inhaler INHALE 2 PUFFS INTO THE LUNGS EVERY 4 HOURS AS NEEDED FOR WHEEZING OR SHORTNESS OF BREATH   Vitamin D, Ergocalciferol, (DRISDOL) 1.25 MG (50000 UNIT) CAPS capsule TAKE 1 CAPSULE BY MOUTH EVERY 7 DAYS   [DISCONTINUED] amoxicillin-clavulanate (AUGMENTIN) 875-125 MG tablet Take 1 tablet by  mouth 2 (two) times daily.   [DISCONTINUED] phentermine (ADIPEX-P) 37.5 MG tablet Take 1 tablet (37.5 mg total) by mouth daily before breakfast.   phentermine (ADIPEX-P) 37.5 MG tablet Take 1 tablet (37.5 mg total) by mouth daily before breakfast.   [DISCONTINUED] NIFEdipine (ADALAT CC) 30 MG 24 hr tablet Take 1 tablet (30 mg total) by mouth daily.   No facility-administered encounter medications on file as of 03/09/2023.    Surgical History: Past Surgical History:  Procedure Laterality Date   FOOT SURGERY  2015   REPAIR VAGINAL CUFF N/A 09/06/2020   Procedure: REPAIR VAGINAL CUFF;  Surgeon: Hildred Laser, MD;  Location: [facility] ORS;  Service: Gynecology;  Laterality: N/A;   TOOTH EXTRACTION  2015    Medical History: Past Medical History:  Diagnosis Date   Anemia    Asthma    Eczema    Environmental allergies    History of blood transfusion    after giving birth 09/06/2020    Family History: Family History  Problem Relation Age of Onset   Rheum arthritis Mother    Hypertension Mother    Lupus Mother    Schizophrenia Father     Social History   Socioeconomic History   Marital status: Single    Spouse name: Not on file   Number of children: Not on file   Years of education: Not on file   Highest education level: Not on file  Occupational History   Not on file  Tobacco Use   Smoking  status: Never   Smokeless tobacco: Never  Vaping Use   Vaping Use: Never used  Substance and Sexual Activity   Alcohol use: Never    Alcohol/week: 0.0 standard drinks of alcohol   Drug use: Never   Sexual activity: Not Currently    Birth control/protection: I.U.D.  Other Topics Concern   Not on file  Social History Narrative   Not on file   Social Determinants of Health   Financial Resource Strain: Not on file  Food Insecurity: Not on file  Transportation Needs: Not on file  Physical Activity: Not on file  Stress: Not on file  Social Connections: Not on file  Intimate  Partner Violence: Not on file      Review of Systems  Constitutional:  Positive for appetite change and unexpected weight change. Negative for chills, fatigue and fever.  HENT:  Positive for postnasal drip. Negative for congestion, rhinorrhea, sinus pressure, sinus pain, sneezing and sore throat.   Respiratory:  Positive for shortness of breath (intermittent). Negative for cough, chest tightness and wheezing.   Cardiovascular: Negative.  Negative for chest pain and palpitations.  Gastrointestinal: Negative.  Negative for abdominal pain, constipation, diarrhea, nausea and vomiting.  Musculoskeletal:  Negative for arthralgias.  Psychiatric/Behavioral:  Positive for behavioral problems.     Vital Signs: BP 128/74   Pulse 89   Temp 98.3 F (36.8 C)   Resp 16   Ht 5\' 7"  (1.702 m)   Wt 221 lb (100.2 kg)   SpO2 99%   BMI 34.61 kg/m    Physical Exam Vitals reviewed.  Constitutional:      General: She is not in acute distress.    Appearance: Normal appearance. She is obese. She is not ill-appearing.  HENT:     Head: Normocephalic and atraumatic.  Eyes:     Pupils: Pupils are equal, round, and reactive to light.  Cardiovascular:     Rate and Rhythm: Normal rate and regular rhythm.  Pulmonary:     Effort: Pulmonary effort is normal. No respiratory distress.  Neurological:     Mental Status: She is alert and oriented to person, place, and time.  Psychiatric:        Mood and Affect: Mood is depressed.        Speech: Speech is delayed.        Behavior: Behavior is slowed. Behavior is cooperative.        Thought Content: Thought content is not paranoid or delusional. Thought content does not include homicidal or suicidal ideation.        Assessment/Plan: 1. Prediabetes Continue phentermine as prescribed, weight loss will improve her A1c. We will repeat her A1c at her next office visit.  - phentermine (ADIPEX-P) 37.5 MG tablet; Take 1 tablet (37.5 mg total) by mouth daily  before breakfast.  Dispense: 30 tablet; Refill: 0  2. Class 2 severe obesity due to excess calories with serious comorbidity and body mass index (BMI) of 36.0 to 36.9 in adult [facility]) Continue phentermine as prescribed  - phentermine (ADIPEX-P) 37.5 MG tablet; Take 1 tablet (37.5 mg total) by mouth daily before breakfast.  Dispense: 30 tablet; Refill: 0  3. Depression, major, single episode, mild (HCC) Start sertraline 25 mg daily, follow up in 4 weeks  - sertraline (ZOLOFT) 25 MG tablet; Take 1 tablet (25 mg total) by mouth daily.  Dispense: 30 tablet; Refill: 3   General Counseling: Kennetha verbalizes understanding of the findings of todays visit and agrees with plan  of treatment. I have discussed any further diagnostic evaluation that may be needed or ordered today. We also reviewed her medications today. she has been encouraged to call the office with any questions or concerns that should arise related to todays visit.    No orders of the defined types were placed in this encounter.   Meds ordered this encounter  Medications   phentermine (ADIPEX-P) 37.5 MG tablet    Sig: Take 1 tablet (37.5 mg total) by mouth daily before breakfast.    Dispense:  30 tablet    Refill:  0    Dx R73.03 and E66.01. do not run through insurance, pt will bring goodrx coupon   sertraline (ZOLOFT) 25 MG tablet    Sig: Take 1 tablet (25 mg total) by mouth daily.    Dispense:  30 tablet    Refill:  3    Return in about 4 weeks (around 04/06/2023) for F/U, Weight loss, eval new med, Arianah Torgeson PCP.   Total time spent:30 Minutes Time spent includes review of chart, medications, test results, and follow up plan with the patient.   [place] Controlled Substance Database was reviewed by me.  This patient was seen by Sallyanne Kuster, FNP-C in collaboration with Dr. Beverely Risen as a part of collaborative care agreement.   Thao Vanover R. Tedd Sias, MSN, FNP-C Internal medicine

## 2023-03-13 ENCOUNTER — Encounter: Payer: Self-pay | Admitting: Nurse Practitioner

## 2023-03-14 MED ORDER — HPV 9-VALENT RECOMB VACCINE IM SUSP: 0.5000 mL | Freq: Once | INTRAMUSCULAR | 0 refills | Status: AC

## 2023-03-20 DIAGNOSIS — J301 Allergic rhinitis due to pollen: Secondary | ICD-10-CM | POA: Diagnosis not present

## 2023-03-23 ENCOUNTER — Ambulatory Visit (INDEPENDENT_AMBULATORY_CARE_PROVIDER_SITE_OTHER): Payer: Medicaid Other | Admitting: Obstetrics and Gynecology

## 2023-03-23 ENCOUNTER — Encounter: Payer: Self-pay | Admitting: Obstetrics and Gynecology

## 2023-03-23 VITALS — BP 119/76 | HR 90 | Ht 67.0 in | Wt 222.2 lb

## 2023-03-23 DIAGNOSIS — Z30432 Encounter for removal of intrauterine contraceptive device: Secondary | ICD-10-CM | POA: Diagnosis not present

## 2023-03-23 DIAGNOSIS — Z30431 Encounter for routine checking of intrauterine contraceptive device: Secondary | ICD-10-CM

## 2023-03-23 NOTE — Progress Notes (Signed)
HPI:      Sandy Burns is a 22 y.o. G1P1001 who LMP was No LMP recorded. (Menstrual status: IUD).  Subjective:   She presents today asking for IUD removal.  She likes it for birth control but feels that maybe it is contributing to some mood issues that she is having.  She would like to have it out for a while to see if her mood improves.  She plans to use condoms for birth control during that time.  She states that she is absolutely certain she would like it removed today.    Hx: The following portions of the patient's history were reviewed and updated as appropriate:             She  has a past medical history of Anemia, Asthma, Eczema, Environmental allergies, and History of blood transfusion. She does not have any pertinent problems on file. She  has a past surgical history that includes Foot surgery (2015); Tooth extraction (2015); and Repair vaginal cuff (N/A, 09/06/2020). Her family history includes Hypertension in her mother; Lupus in her mother; Rheum arthritis in her mother; Schizophrenia in her father. She  reports that she has never smoked. She has never used smokeless tobacco. She reports that she does not drink alcohol and does not use drugs. She has a current medication list which includes the following prescription(s): winlevi, epipen 2-pak, fexofenadine, fluticasone, fluticasone-salmeterol, montelukast, multiple vitamin, phentermine, opzelura, sertraline, tretinoin, ventolin hfa, vitamin d (ergocalciferol), and [DISCONTINUED] nifedipine. She is allergic to other, cats claw (uncaria tomentosa), dust mite extract, molds & smuts, and tree extract.       Review of Systems:  Review of Systems  Constitutional: Denied constitutional symptoms, night sweats, recent illness, fatigue, fever, insomnia and weight loss.  Eyes: Denied eye symptoms, eye pain, photophobia, vision change and visual disturbance.  Ears/Nose/Throat/Neck: Denied ear, nose, throat or neck symptoms, hearing loss,  nasal discharge, sinus congestion and sore throat.  Cardiovascular: Denied cardiovascular symptoms, arrhythmia, chest pain/pressure, edema, exercise intolerance, orthopnea and palpitations.  Respiratory: Denied pulmonary symptoms, asthma, pleuritic pain, productive sputum, cough, dyspnea and wheezing.  Gastrointestinal: Denied, gastro-esophageal reflux, melena, nausea and vomiting.  Genitourinary: Denied genitourinary symptoms including symptomatic vaginal discharge, pelvic relaxation issues, and urinary complaints.  Musculoskeletal: Denied musculoskeletal symptoms, stiffness, swelling, muscle weakness and myalgia.  Dermatologic: Denied dermatology symptoms, rash and scar.  Neurologic: Denied neurology symptoms, dizziness, headache, neck pain and syncope.  Psychiatric: Denied psychiatric symptoms, anxiety and depression.  Endocrine: Denied endocrine symptoms including hot flashes and night sweats.   Meds:   Current Outpatient Medications on File Prior to Visit  Medication Sig Dispense Refill   Clascoterone (WINLEVI) 1 % CREA Apply to face at bedtime 60 g 2   EPIPEN 2-PAK 0.3 MG/0.3ML SOAJ injection Inject 0.3 mg into the muscle as needed for anaphylaxis. 1 each 0   fexofenadine (ALLEGRA) 180 MG tablet Take 1 tablet (180 mg total) by mouth daily. 30 tablet 2   fluticasone (FLONASE) 50 MCG/ACT nasal spray SHAKE LIQUID AND USE 1 SPRAY IN EACH NOSTRIL DAILY 16 g 5   fluticasone-salmeterol (ADVAIR DISKUS) 100-50 MCG/ACT AEPB INHALE 1 PUFF INTO THE LUNGS TWICE DAILY 60 each 11   montelukast (SINGULAIR) 10 MG tablet TAKE 1 TABLET(10 MG) BY MOUTH AT BEDTIME 90 tablet 3   Multiple Vitamin (MULTIVITAMIN PO) Take by mouth.     phentermine (ADIPEX-P) 37.5 MG tablet Take 1 tablet (37.5 mg total) by mouth daily before breakfast. 30 tablet 0  Ruxolitinib Phosphate (OPZELURA) 1.5 % CREA Apply to legs once a day 60 g 2   sertraline (ZOLOFT) 25 MG tablet Take 1 tablet (25 mg total) by mouth daily. 30 tablet 3    tretinoin (RETIN-A) 0.1 % cream Pea size amount to face nightly 45 g 11   VENTOLIN HFA 108 (90 Base) MCG/ACT inhaler INHALE 2 PUFFS INTO THE LUNGS EVERY 4 HOURS AS NEEDED FOR WHEEZING OR SHORTNESS OF BREATH 18 g 3   Vitamin D, Ergocalciferol, (DRISDOL) 1.25 MG (50000 UNIT) CAPS capsule TAKE 1 CAPSULE BY MOUTH EVERY 7 DAYS 12 capsule 1   [DISCONTINUED] NIFEdipine (ADALAT CC) 30 MG 24 hr tablet Take 1 tablet (30 mg total) by mouth daily. 60 tablet 0   No current facility-administered medications on file prior to visit.      Objective:     Vitals:   03/23/23 1352  BP: 119/76  Pulse: 90   Filed Weights   03/23/23 1352  Weight: 222 lb 3.2 oz (100.8 kg)              Physical examination   Pelvic:   Vulva: Normal appearance.  No lesions.  Vagina: No lesions or abnormalities noted.  Support: Normal pelvic support.  Urethra No masses tenderness or scarring.  Meatus Normal size without lesions or prolapse.  Cervix: Normal appearance.  No lesions. IUD strings noted at cervical os.  Anus: Normal exam.  No lesions.  Perineum: Normal exam.  No lesions.        Bimanual   Uterus: Normal size.  Non-tender.  Mobile.  AV.  Adnexae: No masses.  Non-tender to palpation.  Cul-de-sac: Negative for abnormality.   IUD Removal Strings of IUD identified and grasped.  IUD removed without problem.  Pt tolerated this well.  IUD noted to be intact.            Assessment:    G1P1001 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   Vaginal vault hematoma 09/07/2020   Symptomatic anemia 09/07/2020   Maternal blood transfusion 09/07/2020   Obesity in pregnancy 09/07/2020   Mild pre-eclampsia in third trimester 09/05/2020   Pregnancy 08/22/2020   Asthma affecting pregnancy in third trimester 08/18/2020   Indication for care in labor or delivery 08/14/2020   Labor and delivery, indication for care 07/19/2020     1. Encounter for routine checking  of intrauterine contraceptive device (IUD)   2. Encounter for IUD removal        Plan:            1.  Patient to use condoms and monitor her mood to see if having the IUD removed makes any difference. Orders No orders of the defined types were placed in this encounter.   No orders of the defined types were placed in this encounter.     F/U  Return for Annual Physical. I spent 20 minutes involved in the care of this patient preparing to see the patient by obtaining and reviewing her medical history (including labs, imaging tests and prior procedures), documenting clinical information in the electronic health record (EHR), counseling and coordinating care plans, writing and sending prescriptions, ordering tests or procedures and in direct communicating with the patient and medical staff discussing pertinent items from her history and physical exam.  Elonda Husky, M.D. 03/23/2023 2:11 PM

## 2023-03-23 NOTE — Progress Notes (Signed)
Patient presents today for an IUD removal. She states she has noticed mood changes and would like to take a break from birth control at this time. She reports she does not want to become pregnant at this time and will use condoms.

## 2023-03-31 DIAGNOSIS — R070 Pain in throat: Secondary | ICD-10-CM | POA: Diagnosis not present

## 2023-03-31 DIAGNOSIS — J0300 Acute streptococcal tonsillitis, unspecified: Secondary | ICD-10-CM | POA: Diagnosis not present

## 2023-04-03 DIAGNOSIS — J301 Allergic rhinitis due to pollen: Secondary | ICD-10-CM | POA: Diagnosis not present

## 2023-04-07 ENCOUNTER — Ambulatory Visit: Payer: Medicaid Other | Admitting: Nurse Practitioner

## 2023-04-16 ENCOUNTER — Encounter: Payer: Self-pay | Admitting: Obstetrics and Gynecology

## 2023-04-16 DIAGNOSIS — J020 Streptococcal pharyngitis: Secondary | ICD-10-CM | POA: Diagnosis not present

## 2023-04-19 ENCOUNTER — Telehealth: Payer: Self-pay | Admitting: Internal Medicine

## 2023-04-19 NOTE — Telephone Encounter (Signed)
04/09/22-04/09/23 MR uploaded to [name]

## 2023-04-24 ENCOUNTER — Ambulatory Visit (INDEPENDENT_AMBULATORY_CARE_PROVIDER_SITE_OTHER): Payer: Medicaid Other

## 2023-04-24 VITALS — BP 119/75 | HR 113 | Ht 67.0 in | Wt 219.0 lb

## 2023-04-24 DIAGNOSIS — N912 Amenorrhea, unspecified: Secondary | ICD-10-CM

## 2023-04-24 DIAGNOSIS — Z3201 Encounter for pregnancy test, result positive: Secondary | ICD-10-CM

## 2023-04-24 DIAGNOSIS — N926 Irregular menstruation, unspecified: Secondary | ICD-10-CM

## 2023-04-24 LAB — POCT URINE PREGNANCY: Preg Test, Ur: POSITIVE — AB

## 2023-04-24 NOTE — Progress Notes (Signed)
    NURSE VISIT NOTE  Subjective:    Patient ID: Lynore Coscia, female    DOB: [birth year], 22 y.o.   MRN: [id number]  HPI  Patient is a 22 y.o. G52P1001 female who presents for evaluation of amenorrhea. She believes she could be pregnant. Patient is ambivalent about pregnancy. Sexual Activity: single partner, contraception: none. Current symptoms also include: fatigue. Last period was normal.    Objective:    BP 119/75   Pulse (!) 113   Ht 5\' 7"  (1.702 m)   Wt 219 lb (99.3 kg)   LMP 03/19/2023   BMI 34.30 kg/m   Lab Review  Results for orders placed or performed in visit on 04/24/23  POCT urine pregnancy  Result Value Ref Range   Preg Test, Ur Positive (A) Negative    Assessment:   1. Missed period     Plan:   Pregnancy Test: Positive  Estimated Date of Delivery:12/24/23 Encouraged well-balanced diet, plenty of rest when needed, pre-natal vitamins daily and walking for exercise.  Discussed self-help for nausea, avoiding OTC medications until consulting provider or pharmacist, other than Tylenol as needed, minimal caffeine (1-2 cups daily) and avoiding alcohol.   She will schedule her nurse visit @ [redacted] wks pregnant, u/s for dating and labs @10  wk, and NOB visit at [redacted] wk pregnant.    Feel free to call with any questions.     Cornelius Moras, CMA

## 2023-04-24 NOTE — Patient Instructions (Signed)

## 2023-05-05 ENCOUNTER — Ambulatory Visit (INDEPENDENT_AMBULATORY_CARE_PROVIDER_SITE_OTHER): Payer: Medicaid Other

## 2023-05-05 VITALS — Wt 219.0 lb

## 2023-05-05 DIAGNOSIS — Z3480 Encounter for supervision of other normal pregnancy, unspecified trimester: Secondary | ICD-10-CM

## 2023-05-05 DIAGNOSIS — Z3689 Encounter for other specified antenatal screening: Secondary | ICD-10-CM

## 2023-05-05 DIAGNOSIS — Z369 Encounter for antenatal screening, unspecified: Secondary | ICD-10-CM

## 2023-05-05 NOTE — Patient Instructions (Signed)
First Trimester of Pregnancy  The first trimester of pregnancy starts on the first day of your last menstrual period until the end of week 12. This is also called months 1 through 3 of pregnancy. Body changes during your first trimester Your body goes through many changes during pregnancy. The changes usually return to normal after your baby is born. Physical changes You may gain or lose weight. Your breasts may grow larger and hurt. The area around your nipples may get darker. Dark spots or blotches may develop on your face. You may have changes in your hair. Health changes You may feel like you might vomit (nauseous), and you may vomit. You may have heartburn. You may have headaches. You may have trouble pooping (constipation). Your gums may bleed. Other changes You may get tired easily. You may pee (urinate) more often. Your menstrual periods will stop. You may not feel hungry. You may want to eat certain kinds of food. You may have changes in your emotions from day to day. You may have more dreams. Follow these instructions at home: Medicines Take over-the-counter and prescription medicines only as told by your doctor. Some medicines are not safe during pregnancy. Take a prenatal vitamin that contains at least 600 micrograms (mcg) of folic acid. Eating and drinking Eat healthy meals that include: Fresh fruits and vegetables. Whole grains. Good sources of protein, such as meat, eggs, or tofu. Low-fat dairy products. Avoid raw meat and unpasteurized juice, milk, and cheese. If you feel like you may vomit, or you vomit: Eat 4 or 5 small meals a day instead of 3 large meals. Try eating a few soda crackers. Drink liquids between meals instead of during meals. You may need to take these actions to prevent or treat trouble pooping: Drink enough fluids to keep your pee (urine) pale yellow. Eat foods that are high in fiber. These include beans, whole grains, and fresh fruits and  vegetables. Limit foods that are high in fat and sugar. These include fried or sweet foods. Activity Exercise only as told by your doctor. Most people can do their usual exercise routine during pregnancy. Stop exercising if you have cramps or pain in your lower belly (abdomen) or low back. Do not exercise if it is too hot or too humid, or if you are in a place of great height (high altitude). Avoid heavy lifting. If you choose to, you may have sex unless your doctor tells you not to. Relieving pain and discomfort Wear a good support bra if your breasts are sore. Rest with your legs raised (elevated) if you have leg cramps or low back pain. If you have bulging veins (varicose veins) in your legs: Wear support hose as told by your doctor. Raise your feet for 15 minutes, 3-4 times a day. Limit salt in your food. Safety Wear your seat belt at all times when you are in a car. Talk with your doctor if someone is hurting you or yelling at you. Talk with your doctor if you are feeling sad or have thoughts of hurting yourself. Lifestyle Do not use hot tubs, steam rooms, or saunas. Do not douche. Do not use tampons or scented sanitary pads. Do not use herbal medicines, illegal drugs, or medicines that are not approved by your doctor. Do not drink alcohol. Do not smoke or use any products that contain nicotine or tobacco. If you need help quitting, ask your doctor. Avoid cat litter boxes and soil that is used by cats. These carry   germs that can cause harm to the baby and can cause a loss of your baby by miscarriage or stillbirth. General instructions Keep all follow-up visits. This is important. Ask for help if you need counseling or if you need help with nutrition. Your doctor can give you advice or tell you where to go for help. Visit your dentist. At home, brush your teeth with a soft toothbrush. Floss gently. Write down your questions. Take them to your prenatal visits. Where to find more  information American Pregnancy Association: americanpregnancy.org American College of Obstetricians and Gynecologists: www.acog.org Office on Women's Health: womenshealth.gov/pregnancy Contact a doctor if: You are dizzy. You have a fever. You have mild cramps or pressure in your lower belly. You have a nagging pain in your belly area. You continue to feel like you may vomit, you vomit, or you have watery poop (diarrhea) for 24 hours or longer. You have a bad-smelling fluid coming from your vagina. You have pain when you pee. You are exposed to a disease that spreads from person to person, such as chickenpox, measles, Zika virus, HIV, or hepatitis. Get help right away if: You have spotting or bleeding from your vagina. You have very bad belly cramping or pain. You have shortness of breath or chest pain. You have any kind of injury, such as from a fall or a car crash. You have new or increased pain, swelling, or redness in an arm or leg. Summary The first trimester of pregnancy starts on the first day of your last menstrual period until the end of week 12 (months 1 through 3). Eat 4 or 5 small meals a day instead of 3 large meals. Do not smoke or use any products that contain nicotine or tobacco. If you need help quitting, ask your doctor. Keep all follow-up visits. This information is not intended to replace advice given to you by your health care provider. Make sure you discuss any questions you have with your health care provider. Document Revised: 03/04/2020 Document Reviewed: 01/09/2020 Elsevier Patient Education  2024 [employer] Commonly Asked Questions During Pregnancy  Cats: A parasite can be excreted in cat feces.  To avoid exposure you need to have another person empty the little box.  If you must empty the litter box you will need to wear gloves.  Wash your hands after handling your cat.  This parasite can also be found in raw or undercooked meat so this should also be  avoided.  Colds, Sore Throats, Flu: Please check your medication sheet to see what you can take for symptoms.  If your symptoms are unrelieved by these medications please call the office.  Dental Work: Most any dental work your dentist recommends is permitted.  X-rays should only be taken during the first trimester if absolutely necessary.  Your abdomen should be shielded with a lead apron during all x-rays.  Please notify your provider prior to receiving any x-rays.  Novocaine is fine; gas is not recommended.  If your dentist requires a note from us prior to dental work please call the office and we will provide one for you.  Exercise: Exercise is an important part of staying healthy during your pregnancy.  You may continue most exercises you were accustomed to prior to pregnancy.  Later in your pregnancy you will most likely notice you have difficulty with activities requiring balance like riding a bicycle.  It is important that you listen to your body and avoid activities that put you at a higher   risk of falling.  Adequate rest and staying well hydrated are a must!  If you have questions about the safety of specific activities ask your provider.    Exposure to Children with illness: Try to avoid obvious exposure; report any symptoms to us when noted,  If you have chicken pos, red measles or mumps, you should be immune to these diseases.   Please do not take any vaccines while pregnant unless you have checked with your OB provider.  Fetal Movement: After 28 weeks we recommend you do "kick counts" twice daily.  Lie or sit down in a calm quiet environment and count your baby movements "kicks".  You should feel your baby at least 10 times per hour.  If you have not felt 10 kicks within the first hour get up, walk around and have something sweet to eat or drink then repeat for an additional hour.  If count remains less than 10 per hour notify your provider.  Fumigating: Follow your pest control agent's  advice as to how long to stay out of your home.  Ventilate the area well before re-entering.  Hemorrhoids:   Most over-the-counter preparations can be used during pregnancy.  Check your medication to see what is safe to use.  It is important to use a stool softener or fiber in your diet and to drink lots of liquids.  If hemorrhoids seem to be getting worse please call the office.   Hot Tubs:  Hot tubs Jacuzzis and saunas are not recommended while pregnant.  These increase your internal body temperature and should be avoided.  Intercourse:  Sexual intercourse is safe during pregnancy as long as you are comfortable, unless otherwise advised by your provider.  Spotting may occur after intercourse; report any bright red bleeding that is heavier than spotting.  Labor:  If you know that you are in labor, please go to the hospital.  If you are unsure, please call the office and let us help you decide what to do.  Lifting, straining, etc:  If your job requires heavy lifting or straining please check with your provider for any limitations.  Generally, you should not lift items heavier than that you can lift simply with your hands and arms (no back muscles)  Painting:  Paint fumes do not harm your pregnancy, but may make you ill and should be avoided if possible.  Latex or water based paints have less odor than oils.  Use adequate ventilation while painting.  Permanents & Hair Color:  Chemicals in hair dyes are not recommended as they cause increase hair dryness which can increase hair loss during pregnancy.  " Highlighting" and permanents are allowed.  Dye may be absorbed differently and permanents may not hold as well during pregnancy.  Sunbathing:  Use a sunscreen, as skin burns easily during pregnancy.  Drink plenty of fluids; avoid over heating.  Tanning Beds:  Because their possible side effects are still unknown, tanning beds are not recommended.  Ultrasound Scans:  Routine ultrasounds are performed  at approximately 20 weeks.  You will be able to see your baby's general anatomy an if you would like to know the gender this can usually be determined as well.  If it is questionable when you conceived you may also receive an ultrasound early in your pregnancy for dating purposes.  Otherwise ultrasound exams are not routinely performed unless there is a medical necessity.  Although you can request a scan we ask that you pay for it when   conducted because insurance does not cover " patient request" scans.  Work: If your pregnancy proceeds without complications you may work until your due date, unless your physician or employer advises otherwise.  Round Ligament Pain/Pelvic Discomfort:  Sharp, shooting pains not associated with bleeding are fairly common, usually occurring in the second trimester of pregnancy.  They tend to be worse when standing up or when you remain standing for long periods of time.  These are the result of pressure of certain pelvic ligaments called "round ligaments".  Rest, Tylenol and heat seem to be the most effective relief.  As the womb and fetus grow, they rise out of the pelvis and the discomfort improves.  Please notify the office if your pain seems different than that described.  It may represent a more serious condition.  Common Medications Safe in Pregnancy  Acne:      Constipation:  Benzoyl Peroxide     Colace  Clindamycin      Dulcolax Suppository  Topica Erythromycin     Fibercon  Salicylic Acid      Metamucil         Miralax AVOID:        Senakot   Accutane    Cough:  Retin-A       Cough Drops  Tetracycline      Phenergan w/ Codeine if Rx  Minocycline      Robitussin (Plain & DM)  Antibiotics:     Crabs/Lice:  Ceclor       RID  Cephalosporins    AVOID:  E-Mycins      Kwell  Keflex  Macrobid/Macrodantin   Diarrhea:  Penicillin      Kao-Pectate  Zithromax      Imodium AD         PUSH FLUIDS AVOID:       Cipro     Fever:  Tetracycline      Tylenol (Regular  or Extra  Minocycline       Strength)  Levaquin      Extra Strength-Do not          Exceed 8 tabs/24 hrs Caffeine:        <200mg/day (equiv. To 1 cup of coffee or  approx. 3 12 oz sodas)         Gas: Cold/Hayfever:       Gas-X  Benadryl      Mylicon  Claritin       Phazyme  **Claritin-D        Chlor-Trimeton    Headaches:  Dimetapp      ASA-Free Excedrin  Drixoral-Non-Drowsy     Cold Compress  Mucinex (Guaifenasin)     Tylenol (Regular or Extra  Sudafed/Sudafed-12 Hour     Strength)  **Sudafed PE Pseudoephedrine   Tylenol Cold & Sinus     Vicks Vapor Rub  Zyrtec  **AVOID if Problems With Blood Pressure         Heartburn: Avoid lying down for at least 1 hour after meals  Aciphex      Maalox     Rash:  Milk of Magnesia     Benadryl    Mylanta       1% Hydrocortisone Cream  Pepcid  Pepcid Complete   Sleep Aids:  Prevacid      Ambien   Prilosec       Benadryl  Rolaids       Chamomile Tea  Tums (Limit 4/day)     Unisom           Tylenol PM         Warm milk-add vanilla or  Hemorrhoids:       Sugar for taste  Anusol/Anusol H.C.  (RX: Analapram 2.5%)  Sugar Substitutes:  Hydrocortisone OTC     Ok in moderation  Preparation H      Tucks        Vaseline lotion applied to tissue with wiping    Herpes:     Throat:  Acyclovir      Oragel  Famvir  Valtrex     Vaccines:         Flu Shot Leg Cramps:       *Gardasil  Benadryl      Hepatitis A         Hepatitis B Nasal Spray:       Pneumovax  Saline Nasal Spray     Polio Booster         Tetanus Nausea:       Tuberculosis test or PPD  Vitamin B6 25 mg TID   AVOID:    Dramamine      *Gardasil  Emetrol       Live Poliovirus  Ginger Root 250 mg QID    MMR (measles, mumps &  High Complex Carbs @ Bedtime    rebella)  Sea Bands-Accupressure    Varicella (Chickenpox)  Unisom 1/2 tab TID     *No known complications           If received before Pain:         Known pregnancy;   Darvocet       Resume series  after  Lortab        Delivery  Percocet    Yeast:   Tramadol      Femstat  Tylenol 3      Gyne-lotrimin  Ultram       Monistat  Vicodin           MISC:         All Sunscreens           Hair Coloring/highlights          Insect Repellant's          (Including DEET)         Mystic Tans  

## 2023-05-05 NOTE — Progress Notes (Signed)
New OB Intake  I connected with  Sandy Burns on 05/05/23 at  9:15 AM EDT by telephone and verified that I am speaking with the correct person using two identifiers. Nurse is located at [facility] and pt is located at work.  I explained I am completing New OB Intake today. We discussed her EDD of 12/24/2023 that is based on LMP of 03/19/2023. Pt is G3/P1011. I reviewed her allergies, medications, Medical/Surgical/OB history, and appropriate screenings. There are no cats in the home. Based on history, this is a/an pregnancy uncomplicated .   Patient Active Problem List   Diagnosis Date Noted   Supervision of other normal pregnancy, antepartum 05/05/2023   Delay 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/07/2020   Vaginal vault hematoma 09/07/2020   Symptomatic anemia 09/07/2020   Maternal blood transfusion 09/07/2020   Obesity in pregnancy 09/07/2020   Mild pre-eclampsia in third trimester 09/05/2020   Pregnancy 08/22/2020   Asthma affecting pregnancy in third trimester 08/18/2020   Indication for care in labor or delivery 08/14/2020   Labor and delivery, indication for care 07/19/2020    Concerns addressed today None  Delivery Plans:  Plans to deliver at [facility] in [place] - has heard good things about them; adv still part of [facility] and if she goes there she will have someone deliver her that she hasn't seen before.  Pt aware we only deliver at [facility].  Anatomy US Explained first scheduled [place] will be Aug 2nd and an anatomy scan will be done at 20 weeks.  Labs Discussed genetic screening with patient. Patient desires genetic testing to be drawn at new OB visit. Discussed possible labs to be drawn at new OB appointment.  COVID Vaccine Patient has had COVID vaccine.   Social Determinants of Health Food Insecurity: denies food insecurity Transportation: Patient denies transportation needs. Childcare: Discussed no children allowed at ultrasound appointments.   First visit review I reviewed new OB  appt with pt. I explained she will have ob bloodwork and pap smear/pelvic exam if indicated. Explained pt will be seen by an AOB provider at first visit; encounter routed to appropriate provider.   Loran Senters, [facility] 05/05/2023  9:44 AM

## 2023-05-12 ENCOUNTER — Ambulatory Visit (INDEPENDENT_AMBULATORY_CARE_PROVIDER_SITE_OTHER): Payer: Medicaid Other

## 2023-05-12 DIAGNOSIS — Z3687 Encounter for antenatal screening for uncertain dates: Secondary | ICD-10-CM | POA: Diagnosis not present

## 2023-05-12 DIAGNOSIS — Z3A01 Less than 8 weeks gestation of pregnancy: Secondary | ICD-10-CM | POA: Diagnosis not present

## 2023-05-12 DIAGNOSIS — Z369 Encounter for antenatal screening, unspecified: Secondary | ICD-10-CM

## 2023-05-12 DIAGNOSIS — Z3480 Encounter for supervision of other normal pregnancy, unspecified trimester: Secondary | ICD-10-CM

## 2023-05-18 ENCOUNTER — Encounter: Payer: Medicaid Other | Admitting: Nurse Practitioner

## 2023-05-18 DIAGNOSIS — J301 Allergic rhinitis due to pollen: Secondary | ICD-10-CM | POA: Diagnosis not present

## 2023-06-02 DIAGNOSIS — Z20822 Contact with and (suspected) exposure to covid-19: Secondary | ICD-10-CM | POA: Diagnosis not present

## 2023-06-02 DIAGNOSIS — Z3491 Encounter for supervision of normal pregnancy, unspecified, first trimester: Secondary | ICD-10-CM | POA: Diagnosis not present

## 2023-06-02 DIAGNOSIS — J069 Acute upper respiratory infection, unspecified: Secondary | ICD-10-CM | POA: Diagnosis not present

## 2023-06-16 ENCOUNTER — Other Ambulatory Visit (HOSPITAL_COMMUNITY)
Admission: RE | Admit: 2023-06-16 | Discharge: 2023-06-16 | Disposition: A | Discharge status: Home / Self Care | Payer: Medicaid Other | Source: Ambulatory Visit | Attending: Licensed Practical Nurse | Admitting: Licensed Practical Nurse

## 2023-06-16 ENCOUNTER — Ambulatory Visit (INDEPENDENT_AMBULATORY_CARE_PROVIDER_SITE_OTHER): Payer: Medicaid Other | Admitting: Licensed Practical Nurse

## 2023-06-16 VITALS — BP 114/79 | HR 104 | Wt 219.9 lb

## 2023-06-16 DIAGNOSIS — Z1379 Encounter for other screening for genetic and chromosomal anomalies: Secondary | ICD-10-CM | POA: Diagnosis not present

## 2023-06-16 DIAGNOSIS — Z3481 Encounter for supervision of other normal pregnancy, first trimester: Secondary | ICD-10-CM

## 2023-06-16 DIAGNOSIS — Z3A12 12 weeks gestation of pregnancy: Secondary | ICD-10-CM

## 2023-06-16 DIAGNOSIS — Z0283 Encounter for blood-alcohol and blood-drug test: Secondary | ICD-10-CM

## 2023-06-16 DIAGNOSIS — Z3480 Encounter for supervision of other normal pregnancy, unspecified trimester: Secondary | ICD-10-CM | POA: Insufficient documentation

## 2023-06-16 DIAGNOSIS — Z113 Encounter for screening for infections with a predominantly sexual mode of transmission: Secondary | ICD-10-CM | POA: Insufficient documentation

## 2023-06-16 DIAGNOSIS — O09299 Supervision of pregnancy with other poor reproductive or obstetric history, unspecified trimester: Secondary | ICD-10-CM

## 2023-06-16 DIAGNOSIS — Z131 Encounter for screening for diabetes mellitus: Secondary | ICD-10-CM | POA: Diagnosis not present

## 2023-06-16 LAB — POCT URINALYSIS DIPSTICK
Bilirubin, UA: NEGATIVE
Blood, UA: NEGATIVE
Glucose, UA: NEGATIVE
Ketones, UA: NEGATIVE
Leukocytes, UA: NEGATIVE
Nitrite, UA: NEGATIVE
Protein, UA: NEGATIVE
Spec Grav, UA: 1.015 (ref 1.010–1.025)
Urobilinogen, UA: 0.2 U/dL
pH, UA: 7.5 (ref 5.0–8.0)

## 2023-06-16 MED ORDER — ASPIRIN 81 MG PO CHEW: 81.0000 mg | CHEWABLE_TABLET | Freq: Every day | ORAL | Status: DC

## 2023-06-16 NOTE — Progress Notes (Signed)
New Obstetric Patient H&P    Chief Complaint: "Desires prenatal care"  Here with her son  History of Present Illness: Patient is a 22 y.o. [phone] Not Hispanic or Latino female, presents with amenorrhea and positive home pregnancy test. Patient's last menstrual period was 03/19/2023 (exact date). and based on her  LMP, her EDD is Estimated Date of Delivery: 12/24/23 and her EGA is [redacted]w[redacted]d. Cycles are 5. days, regular, and occur approximately every : 28 days. Her last pap smear was 1 years ago and was no abnormalities.   Sandy Burns had her IUD removed in June to "regulate her body" then became pregnant.  She is happy to be pregnant.  She had an [place] on 8/2 that showed an SIUP at [redacted]w[redacted]d.    .Her last menstrual period was normal and lasted for  5 day(s). Since her LMP she claims she has experienced mild cramping, fatigue, food cravings, and moodiness. She denies vaginal bleeding. Her past medical history is contibutory (BMI 34, Asthma). Her prior pregnancies are notable for  (SVB  2021 at 75w3, female, 49grams, IOL for Preeclampsia, PPH-2UPRBCs, 1UFFP, vaginal laceration repaired in OR, gained 30-40lbs, attempted to BF but infant did not latch) desires to breastfeed, did pump for 2 months  Since her LMP, she admits to the use of tobacco products  no She claims she has gained    unsure  pounds since the start of her pregnancy.  There are cats in the home in the home  no  She admits close contact with children on a regular basis  yes  She has had chicken pox in the past no She has had Tuberculosis exposures, symptoms, or previously tested positive for TB   no Current or past history of domestic violence. no  Genetic Screening/Teratology Counseling: (Includes patient, baby's father, or anyone in either family with:)   1. Patient's age >/= 37 at [facility]  no 2. Thalassemia ([place], [place], Mediterranean, or Asian background): MCV<80  no 3. Neural tube defect (meningomyelocele, spina bifida, anencephaly)   no 4. Congenital heart defect  no  5. Down syndrome  no 6. Tay-Sachs (Jewish, [place]  no 7. Canavan's Disease  no 8. Sickle cell disease or trait (African)  no  9. Hemophilia or other blood disorders  no  10. Muscular dystrophy  no  11. Cystic fibrosis  no  12. Huntington's Chorea  no  13. Mental retardation/autism  no 14. Other inherited genetic or chromosomal disorder  no 15. Maternal metabolic disorder (DM, PKU, etc)  no 16. Patient or FOB with a child with a birth defect not listed above no  16a. Patient or FOB with a birth defect themselves no 17. Recurrent pregnancy loss, or stillbirth  no  18. Any medications since LMP other than prenatal vitamins (include vitamins, supplements, OTC meds, drugs, alcohol)  no 19. Any other genetic/environmental exposure to discuss  no  Infection History:   1. Lives with someone with TB or TB exposed  no  2. Patient or partner has history of genital herpes  no 3. Rash or viral illness since LMP  no 4. History of STI (GC, CT, HPV, syphilis, HIV)  no 5. 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 She feels safe with him, she "just does not  like his behavior" Dentist: is due for exam Has not had flu vaccine, is open to vaccine this fall  Had COVID series Exercise: none   Indications for ASA therapy (per uptodate) One of the following: Previous pregnancy with preeclampsia, especially early onset and with an adverse outcome yes Multifetal gestation No Chronic hypertension No Type 1 or 2 diabetes mellitus No Chronic kidney disease No Autoimmune disease (antiphospholipid syndrome, systemic lupus erythematosus) No  Two or more of the following: Nulliparity No Obesity (body mass index >30 kg/m2) Yes Family history of preeclampsia in mother or  sister No Age ?35 years No Sociodemographic characteristics (African American race, low socioeconomic level) Yes Personal risk factors (eg, previous pregnancy with low birth weight or small for gestational age infant, previous adverse pregnancy outcome [eg, stillbirth], interval >10 years between pregnancies) No   Review of Systems:10 point review of systems negative unless otherwise noted in HPI  Past Medical History:  Patient Active Problem List   Diagnosis Date Noted Date Diagnosed   Supervision of other normal pregnancy, antepartum 05/05/2023      Clinical Staff Provider  Office Location  [facility] Dating  Not found.  Language  English Anatomy US    Flu Vaccine  offer Genetic Screen  NIPS:   TDaP vaccine   offer Hgb A1C or  GTT Early : Third trimester :   Covid No boosters   LAB RESULTS   Rhogam     Blood Type     Feeding Plan breast Antibody    Contraception IUD Rubella    Circumcision yes RPR     Pediatrician  [facility]     Support Person Javontay HIV    Prenatal Classes yes Varicella     GBS  (For PCN allergy, check sensitivities)   BTL Consent  Hep C     VBAC Consent  Pap Diagnosis  Date Value Ref Range Status  12/16/2021   Final   - Negative for intraepithelial lesion or malignancy (NILM)      Hgb Electro      CF      SMA             Delay 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/07/2020    Vaginal vault hematoma 09/07/2020    Symptomatic anemia 09/07/2020    Maternal blood transfusion 09/07/2020    Obesity in pregnancy 09/07/2020    Mild pre-eclampsia in third trimester 09/05/2020    Pregnancy 08/22/2020    Asthma affecting pregnancy in third trimester 08/18/2020    Indication for care in labor or delivery 08/14/2020    Labor and delivery, indication for care 07/19/2020     Past Surgical History:  Past Surgical History:  Procedure Laterality Date   FOOT SURGERY  2015   REPAIR VAGINAL CUFF N/A 09/06/2020   Procedure: REPAIR VAGINAL CUFF;  Surgeon:  Hildred Laser, MD;  Location: [facility] ORS;  Service: Gynecology;  Laterality: N/A;   TOOTH EXTRACTION  2015   WISDOM TOOTH EXTRACTION     four; age 21    Gynecologic History: Patient's last menstrual period was 03/19/2023 (exact date).  Obstetric History: G41P1011  Family History:  Family History  Problem Relation Age of Onset   Rheum arthritis Mother    Hypertension Mother    Lupus Mother    Schizophrenia Father    Healthy Sister    Healthy Sister    Healthy Brother    Healthy Brother    Asthma Maternal Grandmother    Hypertension Maternal  Grandmother    Glaucoma Maternal Grandmother    Carpal tunnel syndrome Maternal Grandmother    Healthy Maternal Grandfather     Social History:  Social History   Socioeconomic History   Marital status: Single    Spouse name: Not on file   Number of children: 1   Years of education: 14   Highest education level: Not on file  Occupational History   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  Tobacco Use   Smoking status: Never   Smokeless tobacco: Never  Vaping Use   Vaping status: Never Used  Substance and Sexual Activity   Alcohol use: Not Currently    Comment: special occ and holidays   Drug use: Never   Sexual activity: Yes    Partners: Male    Birth control/protection: None  Other Topics Concern   Not on file  Social History Narrative   Not on file   Social Determinants of Health   Financial Resource Strain: Medium Risk (05/05/2023)   Overall Financial Resource Strain (CARDIA)    Difficulty of Paying Living Expenses: Somewhat hard  Food Insecurity: No Food Insecurity (05/05/2023)   Hunger Vital Sign    Worried About Running Out of Food in the Last Year: Never true    Ran Out of Food in the Last Year: Never true  Transportation Needs: No Transportation Needs (05/05/2023)   PRAPARE - Administrator, Civil Service (Medical): No    Lack of Transportation (Non-Medical): No  Physical Activity: Insufficiently Active  (05/05/2023)   Exercise Vital Sign    Days of Exercise per Week: 1 day    Minutes of Exercise per Session: 40 min  Stress: No Stress Concern Present (05/05/2023)   [employer] of Occupational Health - Occupational Stress Questionnaire    Feeling of Stress : Not at all  Social Connections: Unknown (05/05/2023)   Social Connection and Isolation Panel [NHANES]    Frequency of Communication with Friends and Family: Once a week    Frequency of Social Gatherings with Friends and Family: Three times a week    Attends Religious Services: Never    Active Member of Clubs or Organizations: No    Attends Banker Meetings: Never    Marital Status: Not on file  Intimate Partner Violence: Not At Risk (05/05/2023)   Humiliation, Afraid, Rape, and Kick questionnaire    Fear of Current or Ex-Partner: No    Emotionally Abused: No    Physically Abused: No    Sexually Abused: No    Allergies:  Allergies  Allergen Reactions   Other Anaphylaxis    Bee stings   Cats Claw (Uncaria Tomentosa)     cats   Dust Mite Extract    Molds & Smuts    Tree Extract     Medications: Prior to Admission medications   Medication Sig Start Date End Date Taking? Authorizing Provider  EPIPEN 2-PAK 0.3 MG/0.3ML SOAJ injection Inject 0.3 mg into the muscle as needed for anaphylaxis. 05/12/22  Yes Abernathy, Arlyss Repress, NP  ferrous sulfate 324 MG TBEC Take 324 mg by mouth daily with breakfast.   Yes [provider]  fluticasone (FLONASE) 50 MCG/ACT nasal spray SHAKE LIQUID AND USE 1 SPRAY IN EACH NOSTRIL DAILY 05/12/22  Yes Abernathy, Alyssa, NP  fluticasone-salmeterol (ADVAIR DISKUS) 100-50 MCG/ACT AEPB INHALE 1 PUFF INTO THE LUNGS TWICE DAILY 05/12/22  Yes Abernathy, Alyssa, NP  prenatal vitamin w/FE, FA (NATACHEW) 29-1 MG CHEW chewable tablet Chew 2 tablets by  mouth daily at 12 noon.   Yes [provider]  Vitamin D, Ergocalciferol, (DRISDOL) 1.25 MG (50000 UNIT) CAPS capsule TAKE 1 CAPSULE BY  MOUTH EVERY 7 DAYS 12/07/22  Yes Abernathy, Arlyss Repress, NP  Clascoterone (WINLEVI) 1 % CREA Apply to face at bedtime Patient not taking: Reported on 04/24/2023 01/11/23   Deirdre Evener, MD  fexofenadine (ALLEGRA) 180 MG tablet Take 1 tablet (180 mg total) by mouth daily. Patient not taking: Reported on 04/24/2023 07/12/22   Sallyanne Kuster, NP  montelukast (SINGULAIR) 10 MG tablet TAKE 1 TABLET(10 MG) BY MOUTH AT BEDTIME Patient not taking: Reported on 05/05/2023 05/12/22   Sallyanne Kuster, NP  Multiple Vitamin (MULTIVITAMIN PO) Take by mouth. Patient not taking: Reported on 05/05/2023    [provider]  phentermine (ADIPEX-P) 37.5 MG tablet Take 1 tablet (37.5 mg total) by mouth daily before breakfast. Patient not taking: Reported on 04/24/2023 03/09/23   Sallyanne Kuster, NP  Ruxolitinib Phosphate (OPZELURA) 1.5 % CREA Apply to legs once a day Patient not taking: Reported on 04/24/2023 05/17/22   Deirdre Evener, MD  sertraline (ZOLOFT) 25 MG tablet Take 1 tablet (25 mg total) by mouth daily. Patient not taking: Reported on 04/24/2023 03/09/23   Sallyanne Kuster, NP  tretinoin (RETIN-A) 0.1 % cream Pea size amount to face nightly Patient not taking: Reported on 04/24/2023 11/17/22   Deirdre Evener, MD  VENTOLIN HFA 108 (90 Base) MCG/ACT inhaler INHALE 2 PUFFS INTO THE LUNGS EVERY 4 HOURS AS NEEDED FOR WHEEZING OR SHORTNESS OF BREATH Patient not taking: Reported on 05/05/2023 09/15/22   Sallyanne Kuster, NP  NIFEdipine (ADALAT CC) 30 MG 24 hr tablet Take 1 tablet (30 mg total) by mouth daily. 09/07/20 01/08/21  Hildred Laser, MD    Physical Exam Vitals: Blood pressure 114/79, pulse (!) 104, weight 219 lb 14.4 oz (99.7 kg), last menstrual period 03/19/2023.  General: NAD HEENT: normocephalic, anicteric Thyroid: no enlargement, no palpable nodules Pulmonary: No increased work of breathing, CTAB Breasts: soft, no masses, nipples flat and pierced, intact bilaterally.  Cardiovascular: RRR,  distal pulses 2+ Abdomen: NABS, soft, non-tender, non-distended.  Umbilicus without lesions.  No hepatomegaly, splenomegaly or masses palpable. No evidence of hernia fetal heart rate 150's  Genitourinary:  External: Normal external female genitalia.  Normal urethral meatus, normal  Bartholin's and Skene's glands.    Vagina: Normal vaginal mucosa, no evidence of prolapse.    Cervix:   Uterus:   Adnexa: ovaries non-enlarged, no adnexal masses  Rectal: deferred Extremities: no edema, erythema, or tenderness Neurologic: Grossly intact Psychiatric: mood appropriate, affect full   Assessment: 22 y.o. G3P1011 at [redacted]w[redacted]d presenting to initiate prenatal care  Plan: 1) Avoid alcoholic beverages. 2) Patient encouraged not to smoke.  3) Discontinue the use of all non-medicinal drugs and chemicals.  4) Take prenatal vitamins daily.  5) Nutrition, food safety (fish, cheese advisories, and high nitrite foods) and exercise discussed. 6) Hospital and practice style discussed with cross coverage system.  7) Genetic Screening, such as with 1st Trimester Screening, cell free fetal DNA, AFP testing, and Ultrasound, as well as with amniocentesis and CVS as appropriate, is discussed with patient. At the conclusion of today's visit patient requested genetic testing 8) Patient is asked about travel to areas at risk for the Zika virus, and counseled to avoid travel and exposure to mosquitoes or sexual partners who may have themselves been exposed to the virus. Testing is discussed, and will be ordered as appropriate.  NOB labs collected  Anatomy US ordered   Carie Caddy, CNM    [facility] 06/16/2023, 12:41 PM

## 2023-06-17 ENCOUNTER — Encounter: Payer: Self-pay | Admitting: Licensed Practical Nurse

## 2023-06-17 LAB — COMPREHENSIVE METABOLIC PANEL
ALT: 8 IU/L (ref 0–32)
AST: 14 IU/L (ref 0–40)
Albumin: 4.1 g/dL (ref 4.0–5.0)
Alkaline Phosphatase: 54 IU/L (ref 44–121)
BUN/Creatinine Ratio: 12 (ref 9–23)
BUN: 8 mg/dL (ref 6–20)
Bilirubin Total: 0.3 mg/dL (ref 0.0–1.2)
CO2: 22 mmol/L (ref 20–29)
Calcium: 9.1 mg/dL (ref 8.7–10.2)
Chloride: 102 mmol/L (ref 96–106)
Creatinine, Ser: 0.65 mg/dL (ref 0.57–1.00)
Globulin, Total: 3 g/dL (ref 1.5–4.5)
Glucose: 74 mg/dL (ref 70–99)
Potassium: 4.8 mmol/L (ref 3.5–5.2)
Sodium: 136 mmol/L (ref 134–144)
Total Protein: 7.1 g/dL (ref 6.0–8.5)
eGFR: 128 mL/min/{1.73_m2} (ref >59)

## 2023-06-17 LAB — RPR+RH+ABO+RUB AB+AB SCR+CB...
Antibody Screen: NEGATIVE
HIV Screen 4th Generation wRfx: NONREACTIVE
Hematocrit: 35.3 % (ref 34.0–46.6)
Hemoglobin: 11.8 g/dL (ref 11.1–15.9)
Hepatitis B Surface Ag: NEGATIVE
MCH: 30.8 pg (ref 26.6–33.0)
MCHC: 33.4 g/dL (ref 31.5–35.7)
MCV: 92 fL (ref 79–97)
Platelets: 268 10*3/uL (ref 150–450)
RBC: 3.83 x10E6/uL (ref 3.77–5.28)
RDW: 12.6 % (ref 11.7–15.4)
RPR Ser Ql: NONREACTIVE
Rh Factor: POSITIVE
Rubella Antibodies, IGG: 1.7 {index} (ref >0.99)
Varicella zoster IgG: <135 {index} — ABNORMAL LOW (ref >165)
WBC: 4.9 10*3/uL (ref 3.4–10.8)

## 2023-06-17 LAB — HEMOGLOBIN A1C
Est. average glucose Bld gHb Est-mCnc: 123 mg/dL
Hgb A1c MFr Bld: 5.9 % — ABNORMAL HIGH (ref 4.8–5.6)

## 2023-06-18 ENCOUNTER — Other Ambulatory Visit: Payer: Self-pay | Admitting: Licensed Practical Nurse

## 2023-06-18 DIAGNOSIS — Z131 Encounter for screening for diabetes mellitus: Secondary | ICD-10-CM

## 2023-06-18 DIAGNOSIS — Z3480 Encounter for supervision of other normal pregnancy, unspecified trimester: Secondary | ICD-10-CM

## 2023-06-18 LAB — URINE DRUG PANEL 7
Amphetamines, Urine: NEGATIVE ng/mL
Barbiturate Quant, Ur: NEGATIVE ng/mL
Benzodiazepine Quant, Ur: NEGATIVE ng/mL
Cannabinoid Quant, Ur: NEGATIVE ng/mL
Cocaine (Metab.): NEGATIVE ng/mL
Opiate Quant, Ur: NEGATIVE ng/mL
PCP Quant, Ur: NEGATIVE ng/mL

## 2023-06-18 LAB — PROTEIN / CREATININE RATIO, URINE
Creatinine, Urine: 209.7 mg/dL
Protein, Ur: 12.5 mg/dL
Protein/Creat Ratio: 60 mg/g{creat} (ref 0–200)

## 2023-06-18 LAB — NICOTINE SCREEN, URINE: Cotinine Ql Scrn, Ur: NEGATIVE ng/mL

## 2023-06-18 LAB — URINE CULTURE

## 2023-06-18 NOTE — Progress Notes (Signed)
Pt seen for NOB, HA1C 5.9.  one hour glucose test ordered. Mychart message sent to pt.  Carie Caddy, CNM   [facility]  06/18/23  8:03 AM

## 2023-06-19 LAB — CERVICOVAGINAL ANCILLARY ONLY
Chlamydia: NEGATIVE
Comment: NEGATIVE
Comment: NEGATIVE
Comment: NORMAL
Neisseria Gonorrhea: NEGATIVE
Trichomonas: NEGATIVE

## 2023-06-21 ENCOUNTER — Encounter: Payer: Self-pay | Admitting: Nurse Practitioner

## 2023-06-22 LAB — MATERNIT 21 PLUS CORE, BLOOD
Fetal Fraction: 7
Result (T21): NEGATIVE
Trisomy 13 (Patau syndrome): NEGATIVE
Trisomy 18 (Edwards syndrome): NEGATIVE
Trisomy 21 (Down syndrome): NEGATIVE

## 2023-06-23 ENCOUNTER — Other Ambulatory Visit: Payer: Self-pay | Admitting: Licensed Practical Nurse

## 2023-06-23 ENCOUNTER — Other Ambulatory Visit: Payer: Medicaid Other

## 2023-06-23 DIAGNOSIS — Z3401 Encounter for supervision of normal first pregnancy, first trimester: Secondary | ICD-10-CM

## 2023-06-23 DIAGNOSIS — Z3480 Encounter for supervision of other normal pregnancy, unspecified trimester: Secondary | ICD-10-CM

## 2023-06-23 DIAGNOSIS — Z131 Encounter for screening for diabetes mellitus: Secondary | ICD-10-CM | POA: Diagnosis not present

## 2023-06-23 MED ORDER — COMPLETENATE 29-1 MG PO CHEW: 2.0000 | CHEWABLE_TABLET | Freq: Every day | ORAL | 9 refills | Status: DC

## 2023-06-24 LAB — GLUCOSE, 1 HOUR GESTATIONAL: Gestational Diabetes Screen: 75 mg/dL (ref 70–139)

## 2023-06-25 DIAGNOSIS — Z20822 Contact with and (suspected) exposure to covid-19: Secondary | ICD-10-CM | POA: Diagnosis not present

## 2023-06-25 DIAGNOSIS — J069 Acute upper respiratory infection, unspecified: Secondary | ICD-10-CM | POA: Diagnosis not present

## 2023-06-26 ENCOUNTER — Ambulatory Visit: Payer: Medicaid Other

## 2023-06-26 ENCOUNTER — Other Ambulatory Visit: Payer: Self-pay

## 2023-06-26 DIAGNOSIS — Z3401 Encounter for supervision of normal first pregnancy, first trimester: Secondary | ICD-10-CM

## 2023-06-26 MED ORDER — COMPLETENATE 29-1 MG PO CHEW: 1.0000 | CHEWABLE_TABLET | Freq: Every day | ORAL | 11 refills | Status: DC

## 2023-06-28 ENCOUNTER — Ambulatory Visit: Payer: Medicaid Other

## 2023-06-29 ENCOUNTER — Telehealth: Payer: Self-pay | Admitting: Nurse Practitioner

## 2023-06-29 NOTE — Telephone Encounter (Signed)
Lvm letting patient know her MR are ready to be picked up-Sandy Burns

## 2023-07-04 ENCOUNTER — Telehealth: Payer: Self-pay | Admitting: Nurse Practitioner

## 2023-07-04 NOTE — Telephone Encounter (Signed)
Lvm and sent message notifying patient that requested MR are ready to be picked up-[name]

## 2023-07-05 ENCOUNTER — Encounter: Payer: Self-pay | Admitting: Licensed Practical Nurse

## 2023-07-06 ENCOUNTER — Emergency Department
Admission: EM | Admit: 2023-07-06 | Discharge: 2023-07-06 | Disposition: A | Discharge status: Home / Self Care | Payer: Medicaid Other | Attending: Emergency Medicine | Admitting: Emergency Medicine

## 2023-07-06 DIAGNOSIS — O99612 Diseases of the digestive system complicating pregnancy, second trimester: Secondary | ICD-10-CM | POA: Diagnosis not present

## 2023-07-06 DIAGNOSIS — Z3A15 15 weeks gestation of pregnancy: Secondary | ICD-10-CM | POA: Diagnosis not present

## 2023-07-06 DIAGNOSIS — R1110 Vomiting, unspecified: Secondary | ICD-10-CM | POA: Diagnosis present

## 2023-07-06 DIAGNOSIS — K529 Noninfective gastroenteritis and colitis, unspecified: Secondary | ICD-10-CM | POA: Diagnosis not present

## 2023-07-06 LAB — CBC
HCT: 34.8 % — ABNORMAL LOW (ref 36.0–46.0)
Hemoglobin: 11.8 g/dL — ABNORMAL LOW (ref 12.0–15.0)
MCH: 30.8 pg (ref 26.0–34.0)
MCHC: 33.9 g/dL (ref 30.0–36.0)
MCV: 90.9 fL (ref 80.0–100.0)
Platelets: 283 10*3/uL (ref 150–400)
RBC: 3.83 MIL/uL — ABNORMAL LOW (ref 3.87–5.11)
RDW: 12 % (ref 11.5–15.5)
WBC: 4.8 10*3/uL (ref 4.0–10.5)
nRBC: 0 % (ref 0.0–0.2)

## 2023-07-06 LAB — URINALYSIS, ROUTINE W REFLEX MICROSCOPIC
Bilirubin Urine: NEGATIVE
Glucose, UA: NEGATIVE mg/dL
Hgb urine dipstick: NEGATIVE
Ketones, ur: NEGATIVE mg/dL
Nitrite: NEGATIVE
Protein, ur: NEGATIVE mg/dL
Specific Gravity, Urine: 1.032 — ABNORMAL HIGH (ref 1.005–1.030)
pH: 5 (ref 5.0–8.0)

## 2023-07-06 LAB — BASIC METABOLIC PANEL
Anion gap: 10 (ref 5–15)
BUN: 12 mg/dL (ref 6–20)
CO2: 22 mmol/L (ref 22–32)
Calcium: 8.8 mg/dL — ABNORMAL LOW (ref 8.9–10.3)
Chloride: 100 mmol/L (ref 98–111)
Creatinine, Ser: 0.52 mg/dL (ref 0.44–1.00)
GFR, Estimated: >60 mL/min (ref >60)
Glucose, Bld: 85 mg/dL (ref 70–99)
Potassium: 3.5 mmol/L (ref 3.5–5.1)
Sodium: 132 mmol/L — ABNORMAL LOW (ref 135–145)

## 2023-07-06 MED ORDER — ONDANSETRON 4 MG PO TBDP: 4.0000 mg | ORAL_TABLET | Freq: Three times a day (TID) | ORAL | 0 refills | Status: DC | PRN

## 2023-07-06 MED ORDER — ONDANSETRON 4 MG PO TBDP
4.0000 mg | ORAL_TABLET | Freq: Once | ORAL | Status: AC
  Administered 2023-07-06: 4 mg via ORAL
  Filled 2023-07-06: qty 1

## 2023-07-06 NOTE — ED Notes (Signed)
..  The patient is A&OX4, ambulatory at d/c with independent steady gait, NAD. Pt verbalized understanding of d/c instructions, prescription and follow up care.

## 2023-07-06 NOTE — ED Triage Notes (Addendum)
Patient reports NVD that began yesterday; Has been taking Kaopectate (as directed by her OB) with no relief; FHT 160

## 2023-07-06 NOTE — ED Provider Notes (Signed)
[facility] Provider Note    Event Date/Time   First MD Initiated Contact with Patient 07/06/23 1926     (approximate)   History   Nausea vomiting diarrhea  HPI  Sandy Burns is a 22 y.o. female who reports she is [redacted] weeks pregnant who presents with complaints of nausea vomiting diarrhea.  She reports her son has the same symptoms.  She reports watery diarrhea frequent vomiting.  She is tolerating sips of water.  No abdominal pain, no vaginal bleeding.     Physical Exam   Triage Vital Signs: ED Triage Vitals  Encounter Vitals Group     BP 07/06/23 1838 121/81     Systolic BP Percentile --      Diastolic BP Percentile --      Pulse Rate 07/06/23 1838 (!) 104     Resp 07/06/23 1838 16     Temp 07/06/23 1838 98.7 F (37.1 C)     Temp src --      SpO2 07/06/23 1838 96 %     Weight --      Height 07/06/23 1842 1.702 m (5\' 7" )     Head Circumference --      Peak Flow --      Pain Score 07/06/23 2011 0     Pain Loc --      Pain Education --      Exclude from Growth Chart --     Most recent vital signs: Vitals:   07/06/23 1838 07/06/23 2011  BP: 121/81 116/68  Pulse: (!) 104 95  Resp: 16 16  Temp: 98.7 F (37.1 C)   SpO2: 96% 100%     General: Awake, no distress.  Pleasant, interactive CV:  Good peripheral perfusion.  Resp:  Normal effort.  Abd:  No distention.  Soft, nontender Other:     ED Results / Procedures / Treatments   Labs (all labs ordered are listed, but only abnormal results are displayed) Labs Reviewed  URINALYSIS, ROUTINE W REFLEX MICROSCOPIC - Abnormal; Notable for the following components:      Result Value   Color, Urine YELLOW (*)    APPearance HAZY (*)    Specific Gravity, Urine 1.032 (*)    Leukocytes,Ua TRACE (*)    Bacteria, UA RARE (*)    All other components within normal limits  BASIC METABOLIC PANEL - Abnormal; Notable for the following components:   Sodium 132 (*)    Calcium 8.8 (*)    All other  components within normal limits  CBC - Abnormal; Notable for the following components:   RBC 3.83 (*)    Hemoglobin 11.8 (*)    HCT 34.8 (*)    All other components within normal limits     EKG     RADIOLOGY     PROCEDURES:  Critical Care performed:   Procedures   MEDICATIONS ORDERED IN ED: Medications  ondansetron (ZOFRAN-ODT) disintegrating tablet 4 mg (4 mg Oral Given 07/06/23 1945)     IMPRESSION / MDM / ASSESSMENT AND PLAN / ED COURSE  I reviewed the triage vital signs and the nursing notes. Patient's presentation is most consistent with acute illness / injury with system symptoms.  Patient with nausea vomiting diarrhea, most consistent with viral gastroenteritis.  No abdominal pain.  No vaginal bleeding  She is tolerating p.o.'s, appears well-hydrated on exam.  Will treat symptomatically with Zofran, outpatient follow-up with OB as needed, return precautions discussed, she agrees with this plan.  FINAL CLINICAL IMPRESSION(S) / ED DIAGNOSES   Final diagnoses:  Gastroenteritis     Rx / DC Orders   ED Discharge Orders          Ordered    ondansetron (ZOFRAN-ODT) 4 MG disintegrating tablet  Every 8 hours PRN        07/06/23 1940             Note:  This document was prepared using Dragon voice recognition software and may include unintentional dictation errors.   Jene Every, MD 07/06/23 2041

## 2023-07-13 ENCOUNTER — Telehealth: Payer: Self-pay | Admitting: Nurse Practitioner

## 2023-07-13 NOTE — Telephone Encounter (Signed)
Attempted again to reach patient regarding MR she requested. Left another vm-[name]

## 2023-07-14 ENCOUNTER — Ambulatory Visit: Payer: Medicaid Other | Admitting: Obstetrics

## 2023-07-14 ENCOUNTER — Encounter: Payer: Self-pay | Admitting: Obstetrics

## 2023-07-14 VITALS — BP 103/73 | HR 103 | Wt 223.5 lb

## 2023-07-14 DIAGNOSIS — Z3A16 16 weeks gestation of pregnancy: Secondary | ICD-10-CM

## 2023-07-14 DIAGNOSIS — Z23 Encounter for immunization: Secondary | ICD-10-CM | POA: Diagnosis not present

## 2023-07-14 DIAGNOSIS — Z124 Encounter for screening for malignant neoplasm of cervix: Secondary | ICD-10-CM

## 2023-07-14 DIAGNOSIS — Z3482 Encounter for supervision of other normal pregnancy, second trimester: Secondary | ICD-10-CM

## 2023-07-14 DIAGNOSIS — Z3480 Encounter for supervision of other normal pregnancy, unspecified trimester: Secondary | ICD-10-CM

## 2023-07-14 NOTE — Progress Notes (Signed)
    Return Prenatal Note   Assessment/Plan   Plan  22 y.o. G3P1011 at [redacted]w[redacted]d presents for follow-up OB visit. Reviewed prenatal record including previous visit note.  Supervision of other normal pregnancy, antepartum -Taking Zofran for occasional nausea -Anatomy scan scheduled for 07/27/23 -Anticipatory guidance about the second trimester and when to seek medical care   Orders Placed This Encounter  Procedures   Flu vaccine trivalent PF, 6mos and older(Flulaval,Afluria,Fluarix,Fluzone)   No follow-ups on file.   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 She is much improved now. She is using her inhaler occasionally but feels well overall. She is starting to feel flutters.  Movement: Present Contractions: Not present  Objective   Flow sheet Vitals: Pulse Rate: (!) 103 BP: 103/73 Fetal Heart Rate (bpm): 150 Total weight gain: 4 lb 8 oz (2.041 kg)  General Appearance  No acute distress, well appearing, and well nourished Pulmonary   Normal work of breathing Neurologic   Alert and oriented to person, place, and time Psychiatric   Mood and affect within normal limits  Sandy Burns, CNM 07/14/23 8:42 AM

## 2023-07-14 NOTE — Assessment & Plan Note (Signed)
-  Taking Zofran for occasional nausea -Anatomy scan scheduled for 07/27/23 -Anticipatory guidance about the second trimester and when to seek medical care

## 2023-07-20 ENCOUNTER — Telehealth: Payer: Self-pay | Admitting: Obstetrics and Gynecology

## 2023-07-20 NOTE — Telephone Encounter (Signed)
Reached out to pt to schedule next ROB appt for around 08/11/2023.  We now have the Nov. Schedule and we can get her scheduled.  Left a message for pt to call back to schedule.

## 2023-07-21 NOTE — Telephone Encounter (Signed)
Pt scheduled with LMD on 08/14/2023 at 8:15.

## 2023-07-26 ENCOUNTER — Ambulatory Visit: Payer: Medicaid Other | Admitting: Dermatology

## 2023-07-26 ENCOUNTER — Encounter: Payer: Self-pay | Admitting: Nurse Practitioner

## 2023-07-26 DIAGNOSIS — Z20822 Contact with and (suspected) exposure to covid-19: Secondary | ICD-10-CM | POA: Diagnosis not present

## 2023-07-26 DIAGNOSIS — J069 Acute upper respiratory infection, unspecified: Secondary | ICD-10-CM | POA: Diagnosis not present

## 2023-07-26 DIAGNOSIS — Z3492 Encounter for supervision of normal pregnancy, unspecified, second trimester: Secondary | ICD-10-CM | POA: Diagnosis not present

## 2023-07-26 DIAGNOSIS — R070 Pain in throat: Secondary | ICD-10-CM | POA: Diagnosis not present

## 2023-07-27 ENCOUNTER — Ambulatory Visit
Admission: RE | Admit: 2023-07-27 | Discharge: 2023-07-27 | Disposition: A | Discharge status: Home / Self Care | Payer: Medicaid Other | Source: Ambulatory Visit | Attending: Licensed Practical Nurse | Admitting: Licensed Practical Nurse

## 2023-07-27 DIAGNOSIS — Z3A18 18 weeks gestation of pregnancy: Secondary | ICD-10-CM | POA: Diagnosis not present

## 2023-07-27 DIAGNOSIS — Z3480 Encounter for supervision of other normal pregnancy, unspecified trimester: Secondary | ICD-10-CM | POA: Diagnosis present

## 2023-07-31 ENCOUNTER — Encounter: Payer: Self-pay | Admitting: Licensed Practical Nurse

## 2023-08-05 ENCOUNTER — Other Ambulatory Visit: Payer: Self-pay

## 2023-08-05 ENCOUNTER — Emergency Department
Admission: EM | Admit: 2023-08-05 | Discharge: 2023-08-05 | Disposition: A | Discharge status: Home / Self Care | Payer: Medicaid Other | Attending: Emergency Medicine | Admitting: Emergency Medicine

## 2023-08-05 ENCOUNTER — Emergency Department: Payer: Medicaid Other

## 2023-08-05 DIAGNOSIS — O209 Hemorrhage in early pregnancy, unspecified: Secondary | ICD-10-CM | POA: Insufficient documentation

## 2023-08-05 DIAGNOSIS — Z3A20 20 weeks gestation of pregnancy: Secondary | ICD-10-CM | POA: Diagnosis not present

## 2023-08-05 DIAGNOSIS — O4690 Antepartum hemorrhage, unspecified, unspecified trimester: Secondary | ICD-10-CM

## 2023-08-05 DIAGNOSIS — Z3A19 19 weeks gestation of pregnancy: Secondary | ICD-10-CM | POA: Diagnosis not present

## 2023-08-05 LAB — CBC
HCT: 30.6 % — ABNORMAL LOW (ref 36.0–46.0)
Hemoglobin: 10.4 g/dL — ABNORMAL LOW (ref 12.0–15.0)
MCH: 31.3 pg (ref 26.0–34.0)
MCHC: 34 g/dL (ref 30.0–36.0)
MCV: 92.2 fL (ref 80.0–100.0)
Platelets: 279 10*3/uL (ref 150–400)
RBC: 3.32 MIL/uL — ABNORMAL LOW (ref 3.87–5.11)
RDW: 12.3 % (ref 11.5–15.5)
WBC: 6.6 10*3/uL (ref 4.0–10.5)
nRBC: 0 % (ref 0.0–0.2)

## 2023-08-05 LAB — COMPREHENSIVE METABOLIC PANEL
ALT: 13 U/L (ref 0–44)
AST: 17 U/L (ref 15–41)
Albumin: 3.3 g/dL — ABNORMAL LOW (ref 3.5–5.0)
Alkaline Phosphatase: 56 U/L (ref 38–126)
Anion gap: 9 (ref 5–15)
BUN: 10 mg/dL (ref 6–20)
CO2: 20 mmol/L — ABNORMAL LOW (ref 22–32)
Calcium: 8.6 mg/dL — ABNORMAL LOW (ref 8.9–10.3)
Chloride: 108 mmol/L (ref 98–111)
Creatinine, Ser: 0.5 mg/dL (ref 0.44–1.00)
GFR, Estimated: >60 mL/min (ref >60)
Glucose, Bld: 88 mg/dL (ref 70–99)
Potassium: 3.7 mmol/L (ref 3.5–5.1)
Sodium: 137 mmol/L (ref 135–145)
Total Bilirubin: 0.4 mg/dL (ref 0.3–1.2)
Total Protein: 6.9 g/dL (ref 6.5–8.1)

## 2023-08-05 LAB — URINALYSIS, ROUTINE W REFLEX MICROSCOPIC
Bilirubin Urine: NEGATIVE
Glucose, UA: NEGATIVE mg/dL
Hgb urine dipstick: NEGATIVE
Ketones, ur: NEGATIVE mg/dL
Leukocytes,Ua: NEGATIVE
Nitrite: NEGATIVE
Protein, ur: NEGATIVE mg/dL
Specific Gravity, Urine: 1.029 (ref 1.005–1.030)
pH: 5 (ref 5.0–8.0)

## 2023-08-05 LAB — LIPASE, BLOOD: Lipase: 25 U/L (ref 11–51)

## 2023-08-05 LAB — HCG, QUANTITATIVE, PREGNANCY: hCG, Beta Chain, Quant, S: 17264 m[IU]/mL — ABNORMAL HIGH (ref <5)

## 2023-08-05 NOTE — ED Provider Notes (Signed)
[facility] Provider Note   Event Date/Time   First MD Initiated Contact with Patient 08/05/23 [phone]     (approximate) History  Abdominal Pain and Vaginal Bleeding  HPI Sandy Burns is a 22 y.o. female with a past medical history of 19-week gestation who presents complaining of vaginal spotting that began at 2 AM last night.  Patient states that when she went to wipe after using the bathroom she saw blood mixed with her urine.  Patient states that she has urinated since then without any red tinge.  Patient does endorse 1 episode of right lower quadrant abdominal pain for this initial bleeding yesterday. ROS: Patient currently denies any vision changes, tinnitus, difficulty speaking, facial droop, sore throat, chest pain, shortness of breath, nausea/vomiting/diarrhea, dysuria, or weakness/numbness/paresthesias in any extremity   Physical Exam  Triage Vital Signs: ED Triage Vitals [08/05/23 0428]  Encounter Vitals Group     BP 124/88     Systolic BP Percentile      Diastolic BP Percentile      Pulse Rate (!) 107     Resp 18     Temp 98.1 F (36.7 C)     Temp src      SpO2 96 %     Weight      Height      Head Circumference      Peak Flow      Pain Score 0     Pain Loc      Pain Education      Exclude from Growth Chart    Most recent vital signs: Vitals:   08/05/23 0842 08/05/23 0844  BP:    Pulse:    Resp: 15   Temp:  98.7 F (37.1 C)  SpO2: 98%    General: Awake, oriented x4. CV:  Good peripheral perfusion.  Resp:  Normal effort.  Abd:  No distention.  Pelvic:  Patient deferred stating she will follow-up with OB/GYN Other:  Young adult overweight African-American female resting comfortably in no acute distress ED Results / Procedures / Treatments  Labs (all labs ordered are listed, but only abnormal results are displayed) Labs Reviewed  COMPREHENSIVE METABOLIC PANEL - Abnormal; Notable for the following components:      Result Value    CO2 20 (*)    Calcium 8.6 (*)    Albumin 3.3 (*)    All other components within normal limits  CBC - Abnormal; Notable for the following components:   RBC 3.32 (*)    Hemoglobin 10.4 (*)    HCT 30.6 (*)    All other components within normal limits  URINALYSIS, ROUTINE W REFLEX MICROSCOPIC - Abnormal; Notable for the following components:   Color, Urine YELLOW (*)    APPearance HAZY (*)    All other components within normal limits  HCG, QUANTITATIVE, PREGNANCY - Abnormal; Notable for the following components:   hCG, Beta Chain, Quant, S 17,264 (*)    All other components within normal limits  LIPASE, BLOOD   RADIOLOGY ED MD interpretation: OB ultrasound interpreted independently by me and shows a single viable intrauterine pregnancy with estimated gestational age of [redacted] weeks and 4 days with fetal heart rate of 130 bpm.  There is no acute findings -Agree with radiology assessment Official radiology report(s): US OB Limited > 14 wks  Result Date: 08/05/2023 CLINICAL DATA:  22 year old pregnant female with vaginal bleeding for 1 day. EXAM: LIMITED OBSTETRIC ULTRASOUND COMPARISON:  OB ultrasound 07/27/2023. FINDINGS:  Number of Fetuses: 1 Heart Rate:  130 bpm Movement: Yes Presentation: Cephalic Placental Location: Anterior Previa: None Amniotic Fluid (Subjective):  Normal AFI: 3.1 cm BPD: 4.83 cm 20 w  4 d MATERNAL FINDINGS: Cervix:  Appears closed. Uterus/Adnexae: No abnormality visualized. IMPRESSION: 1. Single viable IUP with estimated gestational age of [redacted] weeks and 4 days and fetal heart rate of 130 beats per minute. No acute findings. This exam is performed on an emergent basis and does not comprehensively evaluate fetal size, dating, or anatomy; follow-up complete OB US should be considered if further fetal assessment is warranted. Electronically Signed   By: Trudie Reed M.D.   On: 08/05/2023 09:07   PROCEDURES: Critical Care performed: No Procedures MEDICATIONS ORDERED IN  ED: Medications - No data to display IMPRESSION / MDM / ASSESSMENT AND PLAN / ED COURSE  I reviewed the triage vital signs and the nursing notes.                             The patient is on the cardiac monitor to evaluate for evidence of arrhythmia and/or significant heart rate changes. Patient's presentation is most consistent with acute presentation with potential threat to life or bodily function. Workup: CBC, BMP, UA, bHCG, OB Ultrasound  Based on History, Exam, and ED Workup patient's presentation not consistent with ectopic pregnancy, molar pregnancy, life-threatening coagulopathy, trauma, serious bacterial infection, central process or other emergency.  Disposition: Will discharge home with return precautions and instruction for prompt OBGYN follow up.   FINAL CLINICAL IMPRESSION(S) / ED DIAGNOSES   Final diagnoses:  Vaginal bleeding in pregnancy   Rx / DC Orders   ED Discharge Orders     None      Note:  This document was prepared using Dragon voice recognition software and may include unintentional dictation errors.   Merwyn Katos, MD 08/05/23 [phone]

## 2023-08-05 NOTE — ED Notes (Signed)
FHT 138

## 2023-08-05 NOTE — ED Notes (Signed)
FHR 150 by doppler

## 2023-08-05 NOTE — ED Triage Notes (Signed)
Pt reports an episode of abdominal pain yesterday and noticed some pink vaginal discharge today. Pt is [redacted] weeks pregnant and reports she normally feels baby move a lot but has not felt any movement in the last day.

## 2023-08-14 ENCOUNTER — Encounter: Payer: Self-pay | Admitting: Licensed Practical Nurse

## 2023-08-14 ENCOUNTER — Ambulatory Visit (INDEPENDENT_AMBULATORY_CARE_PROVIDER_SITE_OTHER): Payer: Medicaid Other | Admitting: Licensed Practical Nurse

## 2023-08-14 VITALS — BP 109/72 | HR 99 | Wt 228.0 lb

## 2023-08-14 DIAGNOSIS — Z3A21 21 weeks gestation of pregnancy: Secondary | ICD-10-CM

## 2023-08-14 DIAGNOSIS — Z3480 Encounter for supervision of other normal pregnancy, unspecified trimester: Secondary | ICD-10-CM

## 2023-08-14 NOTE — Progress Notes (Signed)
Routine Prenatal Care Visit  Subjective  Sandy Burns is a 22 y.o. G3P1011 at [redacted]w[redacted]d being seen today for ongoing prenatal care.  She is currently monitored for the following issues for this low-risk pregnancy and has Pregnancy; Symptomatic anemia; Obesity in pregnancy; and Supervision of other normal pregnancy, antepartum on their problem list.  ----------------------------------------------------------------------------------- Patient reports no complaints.  Here with her son -Was seen in the ED recently for vaginal bleeding, no concerns were found,pt feels reassured. Offered pelvic exam, pt declined -Mood is good, admits to beng "moody" at times. Had PP rage, wonders if that contributed her not being able to breastfeed, has struggled with depression in the past, encouraged pt to develop a PP support plan,she has family and a partner nearby.  Contractions: Not present. Vag. Bleeding: None.  Movement: Present. Leaking Fluid denies.  ----------------------------------------------------------------------------------- The following portions of the patient's history were reviewed and updated as appropriate: allergies, current medications, past family history, past medical history, past social history, past surgical history and problem list. Problem list updated.  Objective  Blood pressure 109/72, pulse 99, weight 228 lb (103.4 kg), last menstrual period 03/19/2023. Pregravid weight 219 lb (99.3 kg) Total Weight Gain 9 lb (4.082 kg) Urinalysis: Urine Protein    Urine Glucose    Fetal Status: Fetal Heart Rate (bpm): 150 Fundal Height: 23 cm Movement: Present     General:  Alert, oriented and cooperative. Patient is in no acute distress.  Skin: Skin is warm and dry. No rash noted.   Cardiovascular: Normal heart rate noted  Respiratory: Normal respiratory effort, no problems with respiration noted  Abdomen: Soft, gravid, appropriate for gestational age. Pain/Pressure: Absent     Pelvic:  Cervical  exam deferred        Extremities: Normal range of motion.  Edema: None  Mental Status: Normal mood and affect. Normal behavior. Normal judgment and thought content.   Assessment   22 y.o. G3P1011 at [redacted]w[redacted]d by  12/24/2023, Date entered prior to episode creation presenting for routine prenatal visit  Plan   thrid Problems (from 05/05/23 to present)     Problem Noted Resolved   Supervision of other normal pregnancy, antepartum 05/05/2023 by Loran Senters, CMA No   Overview Addendum 05/05/2023  9:44 AM by Loran Senters, CMA     Clinical Staff Provider  Office Location  [facility] Dating  Not found.  Language  English Anatomy US    Flu Vaccine  offer Genetic Screen  NIPS:   TDaP vaccine   offer Hgb A1C or  GTT Early : Third trimester :   Covid No boosters   LAB RESULTS   Rhogam     Blood Type     Feeding Plan breast Antibody    Contraception IUD Rubella    Circumcision yes RPR     Pediatrician  [facility] HBsAg     Support Person Javontay HIV    Prenatal Classes yes Varicella     GBS  (For PCN allergy, check sensitivities)   BTL Consent  Hep C     VBAC Consent  Pap Diagnosis  Date Value Ref Range Status  12/16/2021   Final   - Negative for intraepithelial lesion or malignancy (NILM)      Hgb Electro      CF      SMA                    Preterm labor symptoms and general obstetric precautions including but  not limited to vaginal bleeding, contractions, leaking of fluid and fetal movement were reviewed in detail with the patient. Please refer to After Visit Summary for other counseling recommendations.   Return in about 4 weeks (around 09/11/2023) for ROB.  Fu anatomy US ordered  Jannifer Hick  [facility]  08/14/23  8:37 AM

## 2023-08-21 DIAGNOSIS — J309 Allergic rhinitis, unspecified: Secondary | ICD-10-CM | POA: Diagnosis not present

## 2023-08-25 ENCOUNTER — Ambulatory Visit
Admission: RE | Admit: 2023-08-25 | Discharge: 2023-08-25 | Disposition: A | Discharge status: Home / Self Care | Payer: Medicaid Other | Source: Ambulatory Visit | Attending: Licensed Practical Nurse | Admitting: Licensed Practical Nurse

## 2023-08-25 DIAGNOSIS — Z3482 Encounter for supervision of other normal pregnancy, second trimester: Secondary | ICD-10-CM | POA: Insufficient documentation

## 2023-08-25 DIAGNOSIS — Z3480 Encounter for supervision of other normal pregnancy, unspecified trimester: Secondary | ICD-10-CM | POA: Diagnosis present

## 2023-08-25 DIAGNOSIS — Z3A21 21 weeks gestation of pregnancy: Secondary | ICD-10-CM | POA: Insufficient documentation

## 2023-09-05 DIAGNOSIS — J209 Acute bronchitis, unspecified: Secondary | ICD-10-CM | POA: Diagnosis not present

## 2023-09-11 ENCOUNTER — Ambulatory Visit (INDEPENDENT_AMBULATORY_CARE_PROVIDER_SITE_OTHER): Payer: Medicaid Other | Admitting: Advanced Practice Midwife

## 2023-09-11 ENCOUNTER — Encounter: Payer: Self-pay | Admitting: Advanced Practice Midwife

## 2023-09-11 ENCOUNTER — Other Ambulatory Visit (HOSPITAL_COMMUNITY)
Admission: RE | Admit: 2023-09-11 | Discharge: 2023-09-11 | Disposition: A | Discharge status: Home / Self Care | Payer: Medicaid Other | Source: Ambulatory Visit | Attending: Advanced Practice Midwife | Admitting: Advanced Practice Midwife

## 2023-09-11 VITALS — BP 104/74 | HR 106 | Wt 228.3 lb

## 2023-09-11 DIAGNOSIS — N9489 Other specified conditions associated with female genital organs and menstrual cycle: Secondary | ICD-10-CM | POA: Diagnosis present

## 2023-09-11 DIAGNOSIS — Z113 Encounter for screening for infections with a predominantly sexual mode of transmission: Secondary | ICD-10-CM

## 2023-09-11 DIAGNOSIS — Z130 Encounter for screening for diseases of the blood and blood-forming organs and certain disorders involving the immune mechanism: Secondary | ICD-10-CM

## 2023-09-11 DIAGNOSIS — N898 Other specified noninflammatory disorders of vagina: Secondary | ICD-10-CM | POA: Diagnosis present

## 2023-09-11 DIAGNOSIS — Z3A25 25 weeks gestation of pregnancy: Secondary | ICD-10-CM

## 2023-09-11 DIAGNOSIS — Z369 Encounter for antenatal screening, unspecified: Secondary | ICD-10-CM

## 2023-09-11 DIAGNOSIS — Z3482 Encounter for supervision of other normal pregnancy, second trimester: Secondary | ICD-10-CM

## 2023-09-11 DIAGNOSIS — Z131 Encounter for screening for diabetes mellitus: Secondary | ICD-10-CM

## 2023-09-11 DIAGNOSIS — Z3480 Encounter for supervision of other normal pregnancy, unspecified trimester: Secondary | ICD-10-CM

## 2023-09-11 LAB — POCT URINALYSIS DIPSTICK
Bilirubin, UA: NEGATIVE
Blood, UA: NEGATIVE
Glucose, UA: NEGATIVE
Ketones, UA: NEGATIVE
Leukocytes, UA: NEGATIVE
Nitrite, UA: NEGATIVE
Protein, UA: NEGATIVE
Spec Grav, UA: 1.02 (ref 1.010–1.025)
Urobilinogen, UA: 0.2 U/dL
pH, UA: 7 (ref 5.0–8.0)

## 2023-09-11 NOTE — Patient Instructions (Signed)

## 2023-09-11 NOTE — Progress Notes (Signed)
Routine Prenatal Care Visit  Subjective  Sandy Burns is a 22 y.o. G3P1011 at [redacted]w[redacted]d being seen today for ongoing prenatal care.  She is currently monitored for the following issues for this low-risk pregnancy and has Obesity in pregnancy and Supervision of other normal pregnancy, antepartum on their problem list.  ----------------------------------------------------------------------------------- Patient reports labial swelling without other symptoms except for minimal itching. She will do a self swab vaginitis swab today. Also recommended she can try OTC Monistat, and tub soak with either sea salt or apple cider vinegar.   Contractions: Not present. Vag. Bleeding: None.  Movement: Present. Leaking Fluid denies.  ----------------------------------------------------------------------------------- The following portions of the patient's history were reviewed and updated as appropriate: allergies, current medications, past family history, past medical history, past social history, past surgical history and problem list. Problem list updated.  Objective  Blood pressure 104/74, pulse (!) 106, weight 228 lb 4.8 oz (103.6 kg), last menstrual period 03/19/2023. Pregravid weight 219 lb (99.3 kg) Total Weight Gain 9 lb 4.8 oz (4.218 kg) Urinalysis: Urine Protein    Urine Glucose    Fetal Status: Fetal Heart Rate (bpm): 153 Fundal Height: 26 cm Movement: Present     General:  Alert, oriented and cooperative. Patient is in no acute distress.  Skin: Skin is warm and dry. No rash noted.   Cardiovascular: Normal heart rate noted  Respiratory: Normal respiratory effort, no problems with respiration noted  Abdomen: Soft, gravid, appropriate for gestational age. Pain/Pressure: Absent     Pelvic:  Cervical exam deferred        Extremities: Normal range of motion.  Edema: Trace  Mental Status: Normal mood and affect. Normal behavior. Normal judgment and thought content.   Assessment   22 y.o. G3P1011 at  [redacted]w[redacted]d by  12/24/2023, Date entered prior to episode creation presenting for routine prenatal visit  Plan   thrid Problems (from 05/05/23 to present)     Problem Noted Resolved   Supervision of other normal pregnancy, antepartum 05/05/2023 by Loran Senters, CMA No   Overview Addendum 08/14/2023  8:43 AM by Ellwood Sayers, CNM     Clinical Staff Provider  Office Location  [facility] Dating  LMP  Language  English Anatomy US  Kidney not visualized   Flu Vaccine  offer Genetic Screen  NIPS: neg, female  TDaP vaccine   offer Hgb A1C or  GTT Early :5.9, 75 Third trimester :   Covid No boosters   LAB RESULTS   Rhogam  A/Positive/-- (09/06 1036)  Blood Type A/Positive/-- (09/06 1036)   Feeding Plan breast Antibody Negative (09/06 1036)  Contraception IUD Rubella 1.70 (09/06 1036)  Circumcision yes RPR Non Reactive (09/06 1036)   Pediatrician  [facility] HBsAg Negative (09/06 1036)   Support Person Javontay HIV Non Reactive (09/06 1036)  Prenatal Classes yes Varicella Immune    GBS  (For PCN allergy, check sensitivities)   BTL Consent  Hep C     VBAC Consent  Pap Diagnosis  Date Value Ref Range Status  12/16/2021   Final   - Negative for intraepithelial lesion or malignancy (NILM)      Hgb Electro      CF      SMA                    Preterm labor symptoms and general obstetric precautions including but not limited to vaginal bleeding, contractions, leaking of fluid and fetal movement were reviewed in detail with  the patient. Please refer to After Visit Summary for other counseling recommendations.   Return in about 3 weeks (around 10/02/2023) for 28 wk labs and rob.  Tresea Mall, CNM 09/11/2023 8:38 AM

## 2023-09-13 ENCOUNTER — Encounter: Payer: Self-pay | Admitting: Licensed Practical Nurse

## 2023-09-13 LAB — CERVICOVAGINAL ANCILLARY ONLY
Bacterial Vaginitis (gardnerella): NEGATIVE
Candida Glabrata: NEGATIVE
Candida Vaginitis: POSITIVE — AB
Comment: NEGATIVE
Comment: NEGATIVE
Comment: NEGATIVE

## 2023-09-17 ENCOUNTER — Encounter: Payer: Self-pay | Admitting: Licensed Practical Nurse

## 2023-09-18 DIAGNOSIS — J320 Chronic maxillary sinusitis: Secondary | ICD-10-CM | POA: Diagnosis not present

## 2023-09-21 ENCOUNTER — Encounter: Payer: Self-pay | Admitting: Licensed Practical Nurse

## 2023-09-21 DIAGNOSIS — J209 Acute bronchitis, unspecified: Secondary | ICD-10-CM | POA: Diagnosis not present

## 2023-09-21 DIAGNOSIS — R519 Headache, unspecified: Secondary | ICD-10-CM | POA: Diagnosis not present

## 2023-09-21 DIAGNOSIS — Z20822 Contact with and (suspected) exposure to covid-19: Secondary | ICD-10-CM | POA: Diagnosis not present

## 2023-10-02 ENCOUNTER — Ambulatory Visit (INDEPENDENT_AMBULATORY_CARE_PROVIDER_SITE_OTHER): Payer: Medicaid Other | Admitting: Licensed Practical Nurse

## 2023-10-02 ENCOUNTER — Encounter: Payer: Self-pay | Admitting: Licensed Practical Nurse

## 2023-10-02 ENCOUNTER — Other Ambulatory Visit: Payer: Medicaid Other

## 2023-10-02 VITALS — BP 109/72 | HR 84 | Wt 238.0 lb

## 2023-10-02 DIAGNOSIS — F320 Major depressive disorder, single episode, mild: Secondary | ICD-10-CM

## 2023-10-02 DIAGNOSIS — Z130 Encounter for screening for diseases of the blood and blood-forming organs and certain disorders involving the immune mechanism: Secondary | ICD-10-CM | POA: Diagnosis not present

## 2023-10-02 DIAGNOSIS — Z3482 Encounter for supervision of other normal pregnancy, second trimester: Secondary | ICD-10-CM

## 2023-10-02 DIAGNOSIS — Z131 Encounter for screening for diabetes mellitus: Secondary | ICD-10-CM

## 2023-10-02 DIAGNOSIS — Z113 Encounter for screening for infections with a predominantly sexual mode of transmission: Secondary | ICD-10-CM | POA: Diagnosis not present

## 2023-10-02 DIAGNOSIS — Z3A28 28 weeks gestation of pregnancy: Secondary | ICD-10-CM

## 2023-10-02 DIAGNOSIS — Z369 Encounter for antenatal screening, unspecified: Secondary | ICD-10-CM

## 2023-10-02 DIAGNOSIS — Z23 Encounter for immunization: Secondary | ICD-10-CM | POA: Diagnosis not present

## 2023-10-02 DIAGNOSIS — Z3480 Encounter for supervision of other normal pregnancy, unspecified trimester: Secondary | ICD-10-CM

## 2023-10-02 LAB — POCT URINALYSIS DIPSTICK
Bilirubin, UA: NEGATIVE
Blood, UA: NEGATIVE
Glucose, UA: NEGATIVE
Ketones, UA: NEGATIVE
Leukocytes, UA: NEGATIVE
Nitrite, UA: NEGATIVE
Protein, UA: NEGATIVE
Spec Grav, UA: 1.015 (ref 1.010–1.025)
Urobilinogen, UA: 0.2 U/dL
pH, UA: 6.5 (ref 5.0–8.0)

## 2023-10-02 MED ORDER — SERTRALINE HCL 25 MG PO TABS
25.0000 mg | ORAL_TABLET | Freq: Every day | ORAL | 3 refills | Status: DC
Start: 2023-10-02 — End: 2024-06-17

## 2023-10-02 NOTE — Progress Notes (Signed)
Routine Prenatal Care Visit  Subjective  Sandy Burns is a 22 y.o. G3P1011 at [redacted]w[redacted]d being seen today for ongoing prenatal care.  She is currently monitored for the following issues for this low-risk pregnancy and has Obesity in pregnancy and Supervision of other normal pregnancy, antepartum on their problem list.  ----------------------------------------------------------------------------------- Patient reports is concerned about possible PPD, she has PP rage last time. Reports she has been emotional and has had attitude lately. She was started on Zoloft by her PCP but stopped once she became pregnant, would like to start again.  -her partner and mother will be her labor support  -had a difficult latch with her first child-did not have a lot of lactation support, reviewed lactation services are available out patient.  -denies yeast symptoms today   Contractions: Irritability. Vag. Bleeding: None.  Movement: Present. Leaking Fluid denies.  ----------------------------------------------------------------------------------- The following portions of the patient's history were reviewed and updated as appropriate: allergies, current medications, past family history, past medical history, past social history, past surgical history and problem list. Problem list updated.  Objective  Blood pressure 109/72, pulse 84, weight 238 lb (108 kg), last menstrual period 03/19/2023. Pregravid weight 219 lb (99.3 kg) Total Weight Gain 19 lb (8.618 kg) Urinalysis: Urine Protein    Urine Glucose    Fetal Status: Fetal Heart Rate (bpm): 155 Fundal Height: 29 cm Movement: Present     General:  Alert, oriented and cooperative. Patient is in no acute distress.  Skin: Skin is warm and dry. No rash noted.   Cardiovascular: Normal heart rate noted  Respiratory: Normal respiratory effort, no problems with respiration noted  Abdomen: Soft, gravid, appropriate for gestational age. Pain/Pressure: Absent     Pelvic:   Cervical exam deferred        Extremities: Normal range of motion.  Edema: Trace  Mental Status: Normal mood and affect. Normal behavior. Normal judgment and thought content.   Assessment   22 y.o. G3P1011 at [redacted]w[redacted]d by  12/24/2023, Date entered prior to episode creation presenting for routine prenatal visit  Plan   thrid Problems (from 05/05/23 to present)     Problem Noted Diagnosed Resolved   Supervision of other normal pregnancy, antepartum 05/05/2023 by Loran Senters, CMA  No   Overview Addendum 10/02/2023 10:59 AM by Burtis Junes, CMA   Clinical Staff Provider  Office Location  [facility] Dating  LMP  Language  English Anatomy US  Kidney not visualized   Flu Vaccine  07/14/23 Genetic Screen  NIPS: neg, female  TDaP vaccine  10/02/23 Hgb A1C or  GTT Early :5.9, 75 Third trimester :   Covid No boosters   LAB RESULTS   Rhogam  A/Positive/-- (09/06 1036)  Blood Type A/Positive/-- (09/06 1036)   Feeding Plan breast Antibody Negative (09/06 1036)  Contraception IUD Rubella 1.70 (09/06 1036)  Circumcision yes RPR Non Reactive (09/06 1036)   Pediatrician  [facility] HBsAg Negative (09/06 1036)   Support Person Javontay HIV Non Reactive (09/06 1036)  Prenatal Classes yes Varicella Immune    GBS  (For PCN allergy, check sensitivities)   BTL Consent  Hep C     VBAC Consent  Pap Diagnosis  Date Value Ref Range Status  12/16/2021   Final   - Negative for intraepithelial lesion or malignancy (NILM)      Hgb Electro      CF      SMA  Preterm labor symptoms and general obstetric precautions including but not limited to vaginal bleeding, contractions, leaking of fluid and fetal movement were reviewed in detail with the patient. Please refer to After Visit Summary for other counseling recommendations.   Return in about 2 weeks (around 10/16/2023) for ROB.  28 wk labs collected  TDAP given   Will start Zoloft 25 mg daily consider increase to 50mg  at  next visit.   Carie Caddy, CNM  [facility]  10/02/23  11:35 AM

## 2023-10-03 LAB — 28 WEEK RH+PANEL
Basophils Absolute: 0 10*3/uL (ref 0.0–0.2)
Basos: 1 %
EOS (ABSOLUTE): 0.2 10*3/uL (ref 0.0–0.4)
Eos: 4 %
Gestational Diabetes Screen: 64 mg/dL — ABNORMAL LOW (ref 70–139)
HIV Screen 4th Generation wRfx: NONREACTIVE
Hematocrit: 35.8 % (ref 34.0–46.6)
Hemoglobin: 11.7 g/dL (ref 11.1–15.9)
Immature Grans (Abs): 0 10*3/uL (ref 0.0–0.1)
Immature Granulocytes: 0 %
Lymphocytes Absolute: 1.2 10*3/uL (ref 0.7–3.1)
Lymphs: 22 %
MCH: 31.9 pg (ref 26.6–33.0)
MCHC: 32.7 g/dL (ref 31.5–35.7)
MCV: 98 fL — ABNORMAL HIGH (ref 79–97)
Monocytes Absolute: 0.6 10*3/uL (ref 0.1–0.9)
Monocytes: 10 %
Neutrophils Absolute: 3.5 10*3/uL (ref 1.4–7.0)
Neutrophils: 63 %
Platelets: 267 10*3/uL (ref 150–450)
RBC: 3.67 x10E6/uL — ABNORMAL LOW (ref 3.77–5.28)
RDW: 12 % (ref 11.7–15.4)
RPR Ser Ql: NONREACTIVE
WBC: 5.5 10*3/uL (ref 3.4–10.8)

## 2023-10-11 NOTE — L&D Delivery Note (Signed)
Delivery Note   Sandy Burns is a 23 y.o. G3P1011 at [redacted]w[redacted]d Estimated Date of Delivery: 12/24/23  PRE-OPERATIVE DIAGNOSIS:  1) [redacted]w[redacted]d pregnancy.  2) IUGR, non-reassuring fetal status 3) GBS positive  POST-OPERATIVE DIAGNOSIS:  1) [redacted]w[redacted]d pregnancy s/p Vaginal, Spontaneous Above and 2) GBS positive, not adequately treated  Delivery Type: Vaginal, Spontaneous   Delivery Anesthesia: Epidural  Labor Complications:  None    ESTIMATED BLOOD LOSS: 50 ml    FINDINGS:   1) female infant, Apgar scores of 8   at 1 minute and 9   at 5 minutes and a birthweight of 86773.06 ounces.     SPECIMENS:   PLACENTA:   Appearance: Intact   Removal: Spontaneous     Disposition:  Lab  CORD BLOOD: N/a  DISPOSITION:  Infant left in stable condition in the delivery room, with L&D personnel and mother,  NARRATIVE SUMMARY: Labor course:  Sandy Burns is a G3P1011 at [redacted]w[redacted]d who presented to Labor & Delivery for induction of labor. Labor proceeded with augmentation. Around 0810, RN and CNM called to bedside for patient feeling pressure. She was found to have an anterior lip that was able to reduced and was completely dilated at 0812 With excellent maternal pushing effort, she birthed a viable female infant at 76. There was a nuchal cord. The shoulders were birthed via the somersault maneuver without difficulty. The infant was placed skin-to-skin with mother. The placenta delivered spontaneously and was noted to be intact with a 3VC. The cord was doubly clamped and cut after delivery of the placenta. A perineal and vaginal examination was performed. Episiotomy/Lacerations: None  The patient tolerated this well. Mother and baby were left in stable condition.  Dr. Logan Bores immediately available for care of patient and aware of patient status.   Lindalou Hose Brice Kossman, CNM 12/01/2023 8:30 AM

## 2023-10-13 ENCOUNTER — Encounter: Payer: Self-pay | Admitting: Internal Medicine

## 2023-10-16 ENCOUNTER — Ambulatory Visit: Payer: Medicaid Other | Admitting: Internal Medicine

## 2023-10-17 ENCOUNTER — Encounter: Payer: Self-pay | Admitting: Licensed Practical Nurse

## 2023-10-17 ENCOUNTER — Ambulatory Visit (INDEPENDENT_AMBULATORY_CARE_PROVIDER_SITE_OTHER): Payer: Medicaid Other | Admitting: Licensed Practical Nurse

## 2023-10-17 VITALS — BP 127/78 | HR 89 | Wt 231.4 lb

## 2023-10-17 DIAGNOSIS — Z6836 Body mass index (BMI) 36.0-36.9, adult: Secondary | ICD-10-CM

## 2023-10-17 DIAGNOSIS — Z3482 Encounter for supervision of other normal pregnancy, second trimester: Secondary | ICD-10-CM

## 2023-10-17 DIAGNOSIS — Z3A30 30 weeks gestation of pregnancy: Secondary | ICD-10-CM

## 2023-10-17 LAB — POCT URINALYSIS DIPSTICK
Bilirubin, UA: NEGATIVE
Blood, UA: NEGATIVE
Glucose, UA: NEGATIVE
Ketones, UA: NEGATIVE
Leukocytes, UA: NEGATIVE
Nitrite, UA: NEGATIVE
Protein, UA: NEGATIVE
Spec Grav, UA: 1.015 (ref 1.010–1.025)
Urobilinogen, UA: 0.2 U/dL
pH, UA: 7 (ref 5.0–8.0)

## 2023-10-17 NOTE — Progress Notes (Signed)
 Routine Prenatal Care Visit  Subjective  Sandy Burns is a 23 y.o. G3P1011 at [redacted]w[redacted]d being seen today for ongoing prenatal care.  She is currently monitored for the following issues for this low-risk pregnancy and has Obesity in pregnancy and Supervision of other normal pregnancy, antepartum on their problem list.  ----------------------------------------------------------------------------------- Patient reports  her right legs tends to swell, it goes down when she elevated her foot. B-H contractions .   -Doing well. Mood has improved. 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 Vag. Bleeding: None.  Movement: Present. Leaking Fluid denies.  ----------------------------------------------------------------------------------- The following portions of the patient's history were reviewed and updated as appropriate: allergies, current medications, past family history, past medical history, past social history, past surgical history and problem list. Problem list updated.  Objective  Blood pressure 127/78, pulse 89, weight 231 lb 6.4 oz (105 kg), last menstrual period 03/19/2023. Pregravid weight 219 lb (99.3 kg) Total Weight Gain 12 lb 6.4 oz (5.625 kg) Urinalysis: Urine Protein    Urine Glucose    Fetal Status: Fetal Heart Rate (bpm): 30 Fundal Height: 140 cm Movement: Present     General:  Alert, oriented and cooperative. Patient is in no acute distress.  Skin: Skin is warm and dry. No rash noted.   Cardiovascular: Normal heart rate noted  Respiratory: Normal respiratory effort, no problems with respiration noted  Abdomen: Soft, gravid, appropriate for gestational age. Pain/Pressure: Present      Pelvic:  Cervical exam deferred        Extremities: Normal range of motion.  Edema: Trace  Mental Status: Normal mood and affect. Normal behavior. Normal judgment and thought content.   Assessment   23 y.o. G3P1011 at [redacted]w[redacted]d by  12/24/2023, Date entered prior to episode creation presenting for routine prenatal visit  Plan   thrid Problems (from 05/05/23 to present)     Problem Noted Diagnosed Resolved   Supervision of other normal pregnancy, antepartum 05/05/2023 by Vicci Levan, CMA  No   Overview Addendum 10/02/2023 11:32 AM by Delinda Jinnie Jansky, CNM   Clinical Staff Provider  Office Location  [facility] Dating  LMP  Language  English Anatomy US   Kidney not visualized   Flu Vaccine  07/14/23 Genetic Screen  NIPS: neg, female  TDaP vaccine  10/02/23 Hgb A1C or  GTT Early :5.9, 75 Third trimester :   Covid No boosters   LAB RESULTS   Rhogam  A/Positive/-- (09/06 1036)  Blood Type A/Positive/-- (09/06 1036)   Feeding Plan breast Antibody Negative (09/06 1036)  Contraception IUD Rubella 1.70 (09/06 1036)  Circumcision yes RPR Non Reactive (09/06 1036)   Pediatrician  [facility] HBsAg Negative (09/06 1036)   Support Person Javontay HIV Non Reactive (09/06 1036)  Prenatal Classes yes Varicella NON Immune    GBS  (For PCN allergy, check sensitivities)   BTL Consent  Hep C     VBAC Consent  Pap Diagnosis  Date Value Ref Range Status  12/16/2021   Final   - Negative for intraepithelial lesion or malignancy (NILM)      Hgb Electro      CF      SMA  Preterm labor symptoms and general obstetric precautions including but not limited to vaginal bleeding, contractions, leaking of fluid and fetal movement were reviewed in detail with the patient. Please refer to After Visit Summary for other counseling recommendations.   Return in about 2 weeks (around 10/31/2023) for ROB. Growth US  ordered   Jinnie Cookey, CNM   [facility]   10/17/23  12:20 PM

## 2023-10-30 ENCOUNTER — Other Ambulatory Visit: Payer: Self-pay | Admitting: Licensed Practical Nurse

## 2023-10-30 DIAGNOSIS — Z3A30 30 weeks gestation of pregnancy: Secondary | ICD-10-CM

## 2023-10-30 DIAGNOSIS — O99213 Obesity complicating pregnancy, third trimester: Secondary | ICD-10-CM

## 2023-10-30 DIAGNOSIS — Z6836 Body mass index (BMI) 36.0-36.9, adult: Secondary | ICD-10-CM

## 2023-10-30 DIAGNOSIS — Z3482 Encounter for supervision of other normal pregnancy, second trimester: Secondary | ICD-10-CM

## 2023-10-31 ENCOUNTER — Ambulatory Visit: Payer: Medicaid Other | Admitting: Internal Medicine

## 2023-10-31 ENCOUNTER — Encounter: Payer: Self-pay | Admitting: Internal Medicine

## 2023-10-31 VITALS — BP 118/70 | HR 87 | Temp 98.6°F | Resp 16 | Ht 67.0 in | Wt 235.4 lb

## 2023-10-31 DIAGNOSIS — J4520 Mild intermittent asthma, uncomplicated: Secondary | ICD-10-CM

## 2023-10-31 DIAGNOSIS — J3089 Other allergic rhinitis: Secondary | ICD-10-CM

## 2023-10-31 DIAGNOSIS — G4719 Other hypersomnia: Secondary | ICD-10-CM | POA: Diagnosis not present

## 2023-10-31 NOTE — Progress Notes (Signed)
[facility] [address] [place], [place] [address]  Pulmonary Sleep Medicine   Office Visit Note  Patient Name: Sandy Burns DOB: 08-22-2001 MRN [id number]  Date of Service: 10/31/2023  Complaints/HPI: She has been going to ENT and has been tested and states she has been undergoing shots. She states seems to be helping. As far as asthma is concerned it is fair. She has been using more rescue due to cold weather.  Office Spirometry Results:     ROS  General: (-) fever, (-) chills, (-) night sweats, (-) weakness Skin: (-) rashes, (-) itching,. Eyes: (-) visual changes, (-) redness, (-) itching. Nose and Sinuses: (-) nasal stuffiness or itchiness, (-) postnasal drip, (-) nosebleeds, (-) sinus trouble. Mouth and Throat: (-) sore throat, (-) hoarseness. Neck: (-) swollen glands, (-) enlarged thyroid, (-) neck pain. Respiratory: + cough, (-) bloody sputum, + shortness of breath, + wheezing. Cardiovascular: - ankle swelling, (-) chest pain. Lymphatic: (-) lymph node enlargement. Neurologic: (-) numbness, (-) tingling. Psychiatric: (-) anxiety, (-) depression   Current Medication: Outpatient Encounter Medications as of 10/31/2023  Medication Sig   aspirin 81 MG chewable tablet Chew 1 tablet (81 mg total) by mouth daily.   cetirizine (ZYRTEC) 10 MG tablet Take 10 mg by mouth daily as needed.   EPIPEN 2-PAK 0.3 MG/0.3ML SOAJ injection Inject 0.3 mg into the muscle as needed for anaphylaxis.   ferrous sulfate 324 MG TBEC Take 324 mg by mouth daily with breakfast.   fluticasone (FLONASE) 50 MCG/ACT nasal spray SHAKE LIQUID AND USE 1 SPRAY IN EACH NOSTRIL DAILY   fluticasone-salmeterol (ADVAIR DISKUS) 100-50 MCG/ACT AEPB INHALE 1 PUFF INTO THE LUNGS TWICE DAILY   montelukast (SINGULAIR) 10 MG tablet TAKE 1 TABLET(10 MG) BY MOUTH AT BEDTIME   ondansetron (ZOFRAN-ODT) 4 MG disintegrating tablet Take 1 tablet (4 mg total) by mouth every 8 (eight) hours as needed for nausea or  vomiting.   prenatal vitamin w/FE, FA (NATACHEW) 29-1 MG CHEW chewable tablet Chew 2 tablets by mouth daily at 12 noon.   sertraline (ZOLOFT) 25 MG tablet Take 1 tablet (25 mg total) by mouth daily.   VENTOLIN HFA 108 (90 Base) MCG/ACT inhaler INHALE 2 PUFFS INTO THE LUNGS EVERY 4 HOURS AS NEEDED FOR WHEEZING OR SHORTNESS OF BREATH   [DISCONTINUED] NIFEdipine (ADALAT CC) 30 MG 24 hr tablet Take 1 tablet (30 mg total) by mouth daily.   No facility-administered encounter medications on file as of 10/31/2023.    Surgical History: Past Surgical History:  Procedure Laterality Date   FOOT SURGERY  2015   REPAIR VAGINAL CUFF N/A 09/06/2020   Procedure: REPAIR VAGINAL CUFF;  Surgeon: Hildred Laser, MD;  Location: [facility] ORS;  Service: Gynecology;  Laterality: N/A;   TOOTH EXTRACTION  2015   WISDOM TOOTH EXTRACTION     four; age 23    Medical History: Past Medical History:  Diagnosis Date   Anemia    Asthma    Delay 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/07/2020   Eczema    Environmental allergies    History of blood transfusion    after giving birth 09/06/2020   Vaginal vault hematoma 09/07/2020    Family History: Family History  Problem Relation Age of Onset   Rheum arthritis Mother    Hypertension Mother    Lupus Mother    Schizophrenia Father    Healthy Sister    Healthy Sister    Healthy Brother    Healthy Brother    Asthma Maternal  Grandmother    Hypertension Maternal Grandmother    Glaucoma Maternal Grandmother    Carpal tunnel syndrome Maternal Grandmother    Healthy Maternal Grandfather     Social History: Social History   Socioeconomic History   Marital status: Single    Spouse name: Not on file   Number of children: 1   Years of education: 14   Highest education level: Not on file  Occupational History   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  Tobacco Use   Smoking status: Never   Smokeless tobacco: Never  Vaping Use   Vaping status: Never Used  Substance and Sexual  Activity   Alcohol use: Not Currently    Comment: special occ and holidays   Drug use: Never   Sexual activity: Yes    Partners: Male    Birth control/protection: None  Other Topics Concern   Not on file  Social History Narrative   Not on file   Social Drivers of Health   Financial Resource Strain: Medium Risk (05/05/2023)   Overall Financial Resource Strain (CARDIA)    Difficulty of Paying Living Expenses: Somewhat hard  Food Insecurity: No Food Insecurity (05/05/2023)   Hunger Vital Sign    Worried About Running Out of Food in the Last Year: Never true    Ran Out of Food in the Last Year: Never true  Transportation Needs: No Transportation Needs (05/05/2023)   PRAPARE - Administrator, Civil Service (Medical): No    Lack of Transportation (Non-Medical): No  Physical Activity: Insufficiently Active (05/05/2023)   Exercise Vital Sign    Days of Exercise per Week: 1 day    Minutes of Exercise per Session: 40 min  Stress: No Stress Concern Present (05/05/2023)   [employer] of Occupational Health - Occupational Stress Questionnaire    Feeling of Stress : Not at all  Social Connections: Unknown (05/05/2023)   Social Connection and Isolation Panel [NHANES]    Frequency of Communication with Friends and Family: Once a week    Frequency of Social Gatherings with Friends and Family: Three times a week    Attends Religious Services: Never    Active Member of Clubs or Organizations: No    Attends Banker Meetings: Never    Marital Status: Not on file  Intimate Partner Violence: Not At Risk (05/05/2023)   Humiliation, Afraid, Rape, and Kick questionnaire    Fear of Current or Ex-Partner: No    Emotionally Abused: No    Physically Abused: No    Sexually Abused: No    Vital Signs: Blood pressure 118/70, pulse 87, temperature 98.6 F (37 C), resp. rate 16, height 5\' 7"  (1.702 m), weight 235 lb 6.4 oz (106.8 kg), last menstrual period 03/19/2023, SpO2  97%.  Examination: General Appearance: The patient is well-developed, well-nourished, and in no distress. Skin: Gross inspection of skin unremarkable. Head: normocephalic, no gross deformities. Eyes: no gross deformities noted. ENT: ears appear grossly normal no exudates. Neck: Supple. No thyromegaly. No LAD. Respiratory: no rhonchi noted. Cardiovascular: Normal S1 and S2 without murmur or rub. Extremities: No cyanosis. pulses are equal. Neurologic: Alert and oriented. No involuntary movements.  LABS: Recent Results (from the past 2160 hours)  Lipase, blood     Status: None   Collection Time: 08/05/23  4:34 AM  Result Value Ref Range   Lipase 25 11 - 51 U/L    Comment: Performed at [facility], [address], [place], [place] [address]  Comprehensive  metabolic panel     Status: Abnormal   Collection Time: 08/05/23  4:34 AM  Result Value Ref Range   Sodium 137 135 - 145 mmol/L   Potassium 3.7 3.5 - 5.1 mmol/L   Chloride 108 98 - 111 mmol/L   CO2 20 (L) 22 - 32 mmol/L   Glucose, Bld 88 70 - 99 mg/dL    Comment: Glucose reference range applies only to samples taken after fasting for at least 8 hours.   BUN 10 6 - 20 mg/dL   Creatinine, Ser 9.56 0.44 - 1.00 mg/dL   Calcium 8.6 (L) 8.9 - 10.3 mg/dL   Total Protein 6.9 6.5 - 8.1 g/dL   Albumin 3.3 (L) 3.5 - 5.0 g/dL   AST 17 15 - 41 U/L   ALT 13 0 - 44 U/L   Alkaline Phosphatase 56 38 - 126 U/L   Total Bilirubin 0.4 0.3 - 1.2 mg/dL   GFR, Estimated >38 >75 mL/min    Comment: (NOTE) Calculated using the CKD-EPI Creatinine Equation (2021)    Anion gap 9 5 - 15    Comment: Performed at [facility], [address] [place], [place] [address]  CBC     Status: Abnormal   Collection Time: 08/05/23  4:34 AM  Result Value Ref Range   WBC 6.6 4.0 - 10.5 K/uL   RBC 3.32 (L) 3.87 - 5.11 MIL/uL   Hemoglobin 10.4 (L) 12.0 - 15.0 g/dL   HCT 95.1 (L) 88.4 - 16.6 %   MCV 92.2 80.0 - 100.0 fL   MCH 31.3 26.0 - 34.0  pg   MCHC 34.0 30.0 - 36.0 g/dL   RDW 06.3 01.6 - 01.0 %   Platelets 279 150 - 400 K/uL   nRBC 0.0 0.0 - 0.2 %    Comment: Performed at [facility], [address] [place], [place] [address]  hCG, quantitative, pregnancy     Status: Abnormal   Collection Time: 08/05/23  4:34 AM  Result Value Ref Range   hCG, Beta Chain, Quant, S 17,264 (H) <5 mIU/mL    Comment:          GEST. AGE      CONC.  (mIU/mL)   <=1 WEEK        5 - 50     2 WEEKS       50 - 500     3 WEEKS       100 - 10,000     4 WEEKS     1,000 - 30,000     5 WEEKS     3,500 - 115,000   6-8 WEEKS     12,000 - 270,000    12 WEEKS     15,000 - 220,000        FEMALE AND NON-PREGNANT FEMALE:     LESS THAN 5 mIU/mL Performed at [facility], [address] [place], [place] [address]   Urinalysis, Routine w reflex microscopic -Urine, Clean Catch     Status: Abnormal   Collection Time: 08/05/23  4:35 AM  Result Value Ref Range   Color, Urine YELLOW (A) YELLOW   APPearance HAZY (A) CLEAR   Specific Gravity, Urine 1.029 1.005 - 1.030   pH 5.0 5.0 - 8.0   Glucose, UA NEGATIVE NEGATIVE mg/dL   Hgb urine dipstick NEGATIVE NEGATIVE   Bilirubin Urine NEGATIVE NEGATIVE   Ketones, ur NEGATIVE NEGATIVE mg/dL   Protein, ur NEGATIVE NEGATIVE mg/dL  Nitrite NEGATIVE NEGATIVE   Leukocytes,Ua NEGATIVE NEGATIVE    Comment: Performed at [facility], [address] [place], [place] [address]  Cervicovaginal ancillary only     Status: Abnormal   Collection Time: 09/11/23  8:42 AM  Result Value Ref Range   Bacterial Vaginitis (gardnerella) Negative    Candida Vaginitis Positive (A)    Candida Glabrata Negative    Comment      Normal Reference Range Bacterial Vaginosis - Negative   Comment Normal Reference Range Candida Species - Negative    Comment Normal Reference Range Candida Galbrata - Negative   POCT Urinalysis Dipstick     Status: None   Collection Time: 09/11/23  8:49 AM  Result Value Ref  Range   Color, UA     Clarity, UA     Glucose, UA Negative Negative   Bilirubin, UA Negative    Ketones, UA Negative    Spec Grav, UA 1.020 1.010 - 1.025   Blood, UA Negative    pH, UA 7.0 5.0 - 8.0   Protein, UA Negative Negative   Urobilinogen, UA 0.2 0.2 or 1.0 E.U./dL   Nitrite, UA Negative    Leukocytes, UA Negative Negative   Appearance     Odor    28 Week RH+Panel     Status: Abnormal   Collection Time: 10/02/23 10:51 AM  Result Value Ref Range   Gestational Diabetes Screen 64 (L) 70 - 139 mg/dL    Comment: According to ADA, a glucose threshold of >139 mg/dL after 19-JYNW load identifies approximately 80% of women with gestational diabetes mellitus, while the sensitivity is further increased to approximately 90% by a threshold of >129 mg/dL.    RPR Ser Ql Non Reactive Non Reactive   HIV Screen 4th Generation wRfx Non Reactive Non Reactive    Comment: HIV-1/HIV-2 antibodies and HIV-1 p24 antigen were NOT detected. There is no laboratory evidence of HIV infection. HIV Negative    WBC 5.5 3.4 - 10.8 x10E3/uL   RBC 3.67 (L) 3.77 - 5.28 x10E6/uL   Hemoglobin 11.7 11.1 - 15.9 g/dL   Hematocrit 29.5 62.1 - 46.6 %   MCV 98 (H) 79 - 97 fL   MCH 31.9 26.6 - 33.0 pg   MCHC 32.7 31.5 - 35.7 g/dL   RDW 30.8 65.7 - 84.6 %   Platelets 267 150 - 450 x10E3/uL   Neutrophils 63 Not Estab. %   Lymphs 22 Not Estab. %   Monocytes 10 Not Estab. %   Eos 4 Not Estab. %   Basos 1 Not Estab. %   Neutrophils Absolute 3.5 1.4 - 7.0 x10E3/uL   Lymphocytes Absolute 1.2 0.7 - 3.1 x10E3/uL   Monocytes Absolute 0.6 0.1 - 0.9 x10E3/uL   EOS (ABSOLUTE) 0.2 0.0 - 0.4 x10E3/uL   Basophils Absolute 0.0 0.0 - 0.2 x10E3/uL   Immature Granulocytes 0 Not Estab. %   Immature Grans (Abs) 0.0 0.0 - 0.1 x10E3/uL  POCT Urinalysis Dipstick     Status: None   Collection Time: 10/02/23 11:19 AM  Result Value Ref Range   Color, UA     Clarity, UA     Glucose, UA Negative Negative   Bilirubin, UA Negative     Ketones, UA Negative    Spec Grav, UA 1.015 1.010 - 1.025   Blood, UA Negative    pH, UA 6.5 5.0 - 8.0   Protein, UA Negative Negative   Urobilinogen, UA 0.2 0.2 or 1.0 E.U./dL  Nitrite, UA Negative    Leukocytes, UA Negative Negative   Appearance     Odor    POCT Urinalysis Dipstick     Status: None   Collection Time: 10/17/23 10:31 AM  Result Value Ref Range   Color, UA     Clarity, UA     Glucose, UA Negative Negative   Bilirubin, UA Negative    Ketones, UA Negative    Spec Grav, UA 1.015 1.010 - 1.025   Blood, UA Negative    pH, UA 7.0 5.0 - 8.0   Protein, UA Negative Negative   Urobilinogen, UA 0.2 0.2 or 1.0 E.U./dL   Nitrite, UA Negative    Leukocytes, UA Negative Negative   Appearance     Odor      Radiology: No results found.  No results found.  No results found.  Assessment and Plan: Patient Active Problem List   Diagnosis Date Noted   Supervision of other normal pregnancy, antepartum 05/05/2023   Obesity in pregnancy 09/07/2020    1. Mild intermittent asthma without complication (Primary) Under fair control avoid cold exposure as discussed. Will continue with current inhalers - Pulmonary function test; Future  2. Obesity, morbid (HCC) Obesity Counseling: Had a lengthy discussion regarding patients BMI and weight issues. Patient was instructed on portion control as well as increased activity.    3. Non-seasonal allergic rhinitis due to other allergic trigger Continue follow up with ENT  4. Other hypersomnia Hypersomnia symptoms with headahces and fatigue. Will get sleep study in the setting of obesity - PSG Sleep Study; Future   General Counseling: I have discussed the findings of the evaluation and examination with Katessa.  I have also discussed any further diagnostic evaluation thatmay be needed or ordered today. Izumi verbalizes understanding of the findings of todays visit. We also reviewed her medications today and discussed drug  interactions and side effects including but not limited excessive drowsiness and altered mental states. We also discussed that there is always a risk not just to her but also people around her. she has been encouraged to call the office with any questions or concerns that should arise related to todays visit.  No orders of the defined types were placed in this encounter.    Time spent: 77  I have personally obtained a history, examined the patient, evaluated laboratory and imaging results, formulated the assessment and plan and placed orders.    Yevonne Pax, MD [facility] Pulmonary and Critical Care Sleep medicine

## 2023-11-01 ENCOUNTER — Telehealth: Payer: Self-pay | Admitting: Internal Medicine

## 2023-11-01 NOTE — Telephone Encounter (Signed)
Per DSK, will wait to schedule pft and SS after patient has delivered. Patient will call back to schedule-[name]

## 2023-11-02 ENCOUNTER — Encounter: Payer: Medicaid Other | Admitting: Obstetrics and Gynecology

## 2023-11-02 ENCOUNTER — Ambulatory Visit (INDEPENDENT_AMBULATORY_CARE_PROVIDER_SITE_OTHER): Payer: Medicaid Other

## 2023-11-02 DIAGNOSIS — O99213 Obesity complicating pregnancy, third trimester: Secondary | ICD-10-CM

## 2023-11-02 DIAGNOSIS — Z3A32 32 weeks gestation of pregnancy: Secondary | ICD-10-CM | POA: Diagnosis not present

## 2023-11-02 DIAGNOSIS — Z3480 Encounter for supervision of other normal pregnancy, unspecified trimester: Secondary | ICD-10-CM

## 2023-11-02 DIAGNOSIS — E669 Obesity, unspecified: Secondary | ICD-10-CM

## 2023-11-08 ENCOUNTER — Encounter: Payer: Self-pay | Admitting: Licensed Practical Nurse

## 2023-11-08 ENCOUNTER — Ambulatory Visit (INDEPENDENT_AMBULATORY_CARE_PROVIDER_SITE_OTHER): Payer: Medicaid Other | Admitting: Licensed Practical Nurse

## 2023-11-08 VITALS — BP 127/81 | HR 75 | Wt 235.7 lb

## 2023-11-08 DIAGNOSIS — Z6836 Body mass index (BMI) 36.0-36.9, adult: Secondary | ICD-10-CM

## 2023-11-08 DIAGNOSIS — Z3A33 33 weeks gestation of pregnancy: Secondary | ICD-10-CM

## 2023-11-08 DIAGNOSIS — Z2911 Encounter for prophylactic immunotherapy for respiratory syncytial virus (RSV): Secondary | ICD-10-CM

## 2023-11-08 DIAGNOSIS — E669 Obesity, unspecified: Secondary | ICD-10-CM

## 2023-11-08 DIAGNOSIS — O99213 Obesity complicating pregnancy, third trimester: Secondary | ICD-10-CM

## 2023-11-08 DIAGNOSIS — Z23 Encounter for immunization: Secondary | ICD-10-CM | POA: Diagnosis not present

## 2023-11-08 DIAGNOSIS — Z3480 Encounter for supervision of other normal pregnancy, unspecified trimester: Secondary | ICD-10-CM | POA: Diagnosis not present

## 2023-11-08 DIAGNOSIS — O99210 Obesity complicating pregnancy, unspecified trimester: Secondary | ICD-10-CM

## 2023-11-08 LAB — POCT URINALYSIS DIPSTICK
Bilirubin, UA: NEGATIVE
Blood, UA: POSITIVE
Glucose, UA: NEGATIVE
Ketones, UA: NEGATIVE
Nitrite, UA: NEGATIVE
Protein, UA: NEGATIVE
Spec Grav, UA: 1.015 (ref 1.010–1.025)
Urobilinogen, UA: 0.2 U/dL
pH, UA: 7 (ref 5.0–8.0)

## 2023-11-08 NOTE — Assessment & Plan Note (Signed)
-  BMI now 36, TWG 16lbs which is appropriate -repeat growth [place] ordered

## 2023-11-08 NOTE — Assessment & Plan Note (Signed)
-  Mood has improved since starting Zoloft, now taking 50mg  daily, no longer feels like a zombie -Pt has been thinking about possible birth outcomes, needed an IOL for preeclampsia, had SVB. Reasons for induction and c-section reviewed -RSV vaccine given

## 2023-11-08 NOTE — Progress Notes (Signed)
    Return Prenatal Note   Subjective   23 y.o. G3P1011 at [redacted]w[redacted]d presents for this follow-up prenatal visit.  Patient notices more swelling in her right leg. Had a HA earlier this week, she did take Tylenol, the HA lingered then went away. Rec Daily Magnesium. BP WNL today, reviewed sxs of Preeclampsia. Sleep has much improved over the last couple of weeks.  Patient reports: Movement: Present Contractions: Irritability  Objective   Flow sheet Vitals: Pulse Rate: 75 BP: 127/81 Fundal Height: 33 cm Fetal Heart Rate (bpm): 145 Total weight gain: 16 lb 11.2 oz (7.575 kg)  General Appearance  No acute distress, well appearing, and well nourished Pulmonary   Normal work of breathing Neurologic   Alert and oriented to person, place, and time Psychiatric   Mood and affect within normal limits  Assessment/Plan   Plan  23 y.o. G3P1011 at [redacted]w[redacted]d presents for follow-up OB visit. Reviewed prenatal record including previous visit note.  Obesity in pregnancy -BMI now 36, TWG 16lbs which is appropriate -repeat growth [place] ordered   Supervision of other normal pregnancy, antepartum -Mood has improved since starting Zoloft, now taking 50mg  daily, no longer feels like a zombie -Pt has been thinking about possible birth outcomes, needed an IOL for preeclampsia, had SVB. Reasons for induction and c-section reviewed -RSV vaccine given      Orders Placed This Encounter  Procedures   Urine Culture    Release to patient:   Immediate [1]   US OB Follow Up    Standing Status:   Future    Expected Date:   11/30/2023    Expiration Date:   03/07/2024    Reason for exam::   BMI >35    [facility]   Respiratory syncytial virus vaccine, preF, subunit, bivalent,(Abrysvo)   POCT Urinalysis Dipstick   Return in about 2 weeks (around 11/22/2023) for ROB.   Future Appointments  Date Time Provider Department Center  11/21/2023  8:55 AM Glenetta Borg, CNM [facility] None  11/30/2023  8:15 AM [facility] [place] 1 [facility] None  12/05/2023  8:15 AM Deirdre Evener, MD [facility] None    For next visit:  continue with routine prenatal care     Ellouise Newer [facility], CNM  11/07/2510:51 PM

## 2023-11-10 LAB — URINE CULTURE

## 2023-11-18 DIAGNOSIS — J069 Acute upper respiratory infection, unspecified: Secondary | ICD-10-CM | POA: Diagnosis not present

## 2023-11-18 DIAGNOSIS — R509 Fever, unspecified: Secondary | ICD-10-CM | POA: Diagnosis not present

## 2023-11-18 DIAGNOSIS — Z20822 Contact with and (suspected) exposure to covid-19: Secondary | ICD-10-CM | POA: Diagnosis not present

## 2023-11-21 ENCOUNTER — Encounter: Payer: Self-pay | Admitting: Obstetrics

## 2023-11-21 ENCOUNTER — Encounter: Payer: Self-pay | Admitting: Licensed Practical Nurse

## 2023-11-21 ENCOUNTER — Ambulatory Visit (INDEPENDENT_AMBULATORY_CARE_PROVIDER_SITE_OTHER): Payer: Medicaid Other | Admitting: Obstetrics

## 2023-11-21 VITALS — BP 117/78 | HR 83 | Wt 234.0 lb

## 2023-11-21 DIAGNOSIS — E669 Obesity, unspecified: Secondary | ICD-10-CM | POA: Diagnosis not present

## 2023-11-21 DIAGNOSIS — O99213 Obesity complicating pregnancy, third trimester: Secondary | ICD-10-CM

## 2023-11-21 DIAGNOSIS — Z3A35 35 weeks gestation of pregnancy: Secondary | ICD-10-CM | POA: Diagnosis not present

## 2023-11-21 DIAGNOSIS — O99210 Obesity complicating pregnancy, unspecified trimester: Secondary | ICD-10-CM

## 2023-11-21 DIAGNOSIS — Z3480 Encounter for supervision of other normal pregnancy, unspecified trimester: Secondary | ICD-10-CM

## 2023-11-21 NOTE — Assessment & Plan Note (Signed)
Feeling some sharp cervical pain. Reviewed comfort measures including pregnancy support band.  Feels as though baby has some days with a lot of movement and other days less movement.. Reviewed typical fetal movement and kick counts. Kick count patient education added to patient instructions.  Reviewed kick counts and preterm labor warning signs. Instructed to call office or come to hospital with persistent headache, vision changes, regular contractions, leaking of fluid, decreased fetal movement or vaginal bleeding.

## 2023-11-21 NOTE — Assessment & Plan Note (Signed)
Growth [place] scheduled 2/20

## 2023-11-21 NOTE — Progress Notes (Signed)
    Return Prenatal Note   Assessment/Plan   Plan  23 y.o. G3P1011 at [redacted]w[redacted]d presents for follow-up OB visit. Reviewed prenatal record including previous visit note.  Supervision of other normal pregnancy, antepartum Feeling some sharp cervical pain. Reviewed comfort measures including pregnancy support band.  Feels as though baby has some days with a lot of movement and other days less movement.. Reviewed typical fetal movement and kick counts. Kick count patient education added to patient instructions.  Reviewed kick counts and preterm labor warning signs. Instructed to call office or come to hospital with persistent headache, vision changes, regular contractions, leaking of fluid, decreased fetal movement or vaginal bleeding.   Obesity in pregnancy Growth [place] scheduled 2/20   No orders of the defined types were placed in this encounter.  No follow-ups on file.   Future Appointments  Date Time Provider Department Center  11/30/2023  8:15 AM [facility] [place] 1 [facility] None  11/30/2023  9:35 AM Linzie Collin, MD [facility] None  12/05/2023  8:15 AM Deirdre Evener, MD [facility] None  12/07/2023  8:55 AM Glenetta Borg, CNM [facility] None  12/14/2023  8:55 AM Doreene Burke, CNM [facility] None  12/21/2023  8:55 AM Dominica Severin, CNM [facility] None    For next visit:  ROB with GBS screening      Subjective   22 y.o. G3P1011 at [redacted]w[redacted]d presents for this follow-up prenatal visit.  Patient has been having some general back and pelvic pain. Patient reports: Movement: Present Contractions: Not present  Objective   Flow sheet Vitals: Pulse Rate: 83 BP: 117/78 Fundal Height: 35 cm Fetal Heart Rate (bpm): 146 Presentation: Vertex Total weight gain: 15 lb (6.804 kg)  General Appearance  No acute distress, well appearing, and well nourished Pulmonary   Normal work of breathing Neurologic   Alert and oriented to person, place, and time Psychiatric   Mood and affect within normal  limits  Oley Balm, CNM  02/11/259:47 AM

## 2023-11-26 ENCOUNTER — Encounter: Payer: Self-pay | Admitting: Obstetrics and Gynecology

## 2023-11-26 ENCOUNTER — Observation Stay
Admission: EM | Admit: 2023-11-26 | Discharge: 2023-11-26 | Disposition: A | Discharge status: Home / Self Care | Payer: Medicaid Other

## 2023-11-26 ENCOUNTER — Other Ambulatory Visit: Payer: Self-pay

## 2023-11-26 DIAGNOSIS — O99891 Other specified diseases and conditions complicating pregnancy: Secondary | ICD-10-CM

## 2023-11-26 DIAGNOSIS — Z3480 Encounter for supervision of other normal pregnancy, unspecified trimester: Principal | ICD-10-CM

## 2023-11-26 DIAGNOSIS — N898 Other specified noninflammatory disorders of vagina: Secondary | ICD-10-CM

## 2023-11-26 DIAGNOSIS — Z3A36 36 weeks gestation of pregnancy: Secondary | ICD-10-CM | POA: Diagnosis not present

## 2023-11-26 DIAGNOSIS — O09299 Supervision of pregnancy with other poor reproductive or obstetric history, unspecified trimester: Secondary | ICD-10-CM

## 2023-11-26 DIAGNOSIS — O4193X0 Disorder of amniotic fluid and membranes, unspecified, third trimester, not applicable or unspecified: Principal | ICD-10-CM | POA: Insufficient documentation

## 2023-11-26 DIAGNOSIS — O4290 Premature rupture of membranes, unspecified as to length of time between rupture and onset of labor, unspecified weeks of gestation: Secondary | ICD-10-CM | POA: Diagnosis present

## 2023-11-26 LAB — URINALYSIS, ROUTINE W REFLEX MICROSCOPIC
Bilirubin Urine: NEGATIVE
Glucose, UA: NEGATIVE mg/dL
Ketones, ur: NEGATIVE mg/dL
Nitrite: NEGATIVE
Protein, ur: 30 mg/dL — AB
Specific Gravity, Urine: 1.03 (ref 1.005–1.030)
pH: 5 (ref 5.0–8.0)

## 2023-11-26 LAB — GROUP B STREP BY PCR: Group B strep by PCR: POSITIVE — AB

## 2023-11-26 LAB — WET PREP, GENITAL
Clue Cells Wet Prep HPF POC: NONE SEEN
Sperm: NONE SEEN
Trich, Wet Prep: NONE SEEN
WBC, Wet Prep HPF POC: >=10 — AB (ref <10)
Yeast Wet Prep HPF POC: NONE SEEN

## 2023-11-26 LAB — RUPTURE OF MEMBRANE (ROM)PLUS: Rom Plus: NEGATIVE

## 2023-11-26 LAB — CHLAMYDIA/NGC RT PCR (ARMC ONLY)
Chlamydia Tr: NOT DETECTED
N gonorrhoeae: NOT DETECTED

## 2023-11-26 NOTE — OB Triage Note (Signed)
23 y.o G3P1 presents to L&D triage at [redacted]w[redacted]d for abdominal cramping, possible LOF, and blurry vision. She reports that cramping began around 11:00 am, and pt denies continued leaking after this morning. She endorses fetal movement and denies vaginal bleeding, ctx's. Vaginal swabs performed and being processed. Monitors applied and assessing.

## 2023-11-26 NOTE — OB Triage Note (Signed)
LABOR & DELIVERY OB TRIAGE NOTE  SUBJECTIVE  HPI Sandy Burns is a 23 y.o. G3P1011 at [redacted]w[redacted]d who presents to Labor & Delivery for evaluation of leaking fluid.  Patient states that she noted increase in vaginal discharge in last hour prior to presenting to L&D triage. She denies vaginal bleeding and discharge has stopped since she arrived at triage.   She also reports some incidents of blurry vision. She has a hx of preeclampsia in previous pregnancy.   OB History     Gravida  3   Para  1   Term  1   Preterm  0   AB  1   Living  1      SAB      IAB  1   Ectopic      Multiple  0   Live Births  1           OBJECTIVE  BP 120/85   Pulse 92   Temp 98.6 F (37 C) (Oral)   Resp 18   LMP 03/19/2023 (Exact Date)   General: A&Ox4 Heart: normal heart rate Lungs: normal work of breathing Abdomen: gravid, soft, non-tender Cervical exam:   deferred  NST I reviewed the NST and it was reactive.  Baseline: 140 Variability: moderate Accelerations: >2 15x15 bpm Decelerations:none Toco: no contractions Category I  ASSESSMENT Impression  1) Pregnancy at G3P1011, [redacted]w[redacted]d, Estimated Date of Delivery: 12/24/23 2) Reassuring maternal/fetal status. 3) ROM+ and wet prep swabs negative. 4) GBS and GC/CT pending.  5) Normotensive in triage, some protein noted in UA.    PLAN 1) Reviewed lab results and provided reassurance that vaginal discharge is most likely related to normal discharge in pregnancy or leaking urine. Will follow up as needed with pending labs.  2) Reviewed preeclampsia precautions and that with normotensive blood pressures, does not meet criteria at this time. Will continue to monitor with normal prenatal care.  3) Has ROB and [place] on 2/20. Continue with routine prenatal care.    Lindalou Hose Kayliee Atienza, CNM  11/26/23  3:00 PM

## 2023-11-28 ENCOUNTER — Encounter: Payer: Self-pay | Admitting: Certified Nurse Midwife

## 2023-11-28 ENCOUNTER — Other Ambulatory Visit: Payer: Self-pay | Admitting: Licensed Practical Nurse

## 2023-11-28 DIAGNOSIS — Z3480 Encounter for supervision of other normal pregnancy, unspecified trimester: Secondary | ICD-10-CM

## 2023-11-28 DIAGNOSIS — Z6836 Body mass index (BMI) 36.0-36.9, adult: Secondary | ICD-10-CM

## 2023-11-28 DIAGNOSIS — O99213 Obesity complicating pregnancy, third trimester: Secondary | ICD-10-CM

## 2023-11-28 DIAGNOSIS — O99210 Obesity complicating pregnancy, unspecified trimester: Secondary | ICD-10-CM

## 2023-11-28 DIAGNOSIS — Z2911 Encounter for prophylactic immunotherapy for respiratory syncytial virus (RSV): Secondary | ICD-10-CM

## 2023-11-28 DIAGNOSIS — Z3A33 33 weeks gestation of pregnancy: Secondary | ICD-10-CM

## 2023-11-30 ENCOUNTER — Encounter: Payer: Self-pay | Admitting: Obstetrics and Gynecology

## 2023-11-30 ENCOUNTER — Other Ambulatory Visit: Payer: Self-pay

## 2023-11-30 ENCOUNTER — Ambulatory Visit (INDEPENDENT_AMBULATORY_CARE_PROVIDER_SITE_OTHER): Payer: Medicaid Other

## 2023-11-30 ENCOUNTER — Ambulatory Visit (INDEPENDENT_AMBULATORY_CARE_PROVIDER_SITE_OTHER): Payer: Medicaid Other | Admitting: Obstetrics and Gynecology

## 2023-11-30 ENCOUNTER — Inpatient Hospital Stay
Admission: AD | Admit: 2023-11-30 | Discharge: 2023-12-03 | DRG: 807 | Disposition: A | Discharge status: Home / Self Care | Payer: Medicaid Other | Source: Ambulatory Visit | Attending: Obstetrics and Gynecology | Admitting: Obstetrics and Gynecology

## 2023-11-30 VITALS — BP 119/82 | HR 83 | Wt 237.8 lb

## 2023-11-30 DIAGNOSIS — O9902 Anemia complicating childbirth: Secondary | ICD-10-CM | POA: Diagnosis present

## 2023-11-30 DIAGNOSIS — O99213 Obesity complicating pregnancy, third trimester: Secondary | ICD-10-CM

## 2023-11-30 DIAGNOSIS — O09299 Supervision of pregnancy with other poor reproductive or obstetric history, unspecified trimester: Secondary | ICD-10-CM

## 2023-11-30 DIAGNOSIS — O26893 Other specified pregnancy related conditions, third trimester: Secondary | ICD-10-CM | POA: Diagnosis not present

## 2023-11-30 DIAGNOSIS — O365930 Maternal care for other known or suspected poor fetal growth, third trimester, not applicable or unspecified: Principal | ICD-10-CM | POA: Diagnosis present

## 2023-11-30 DIAGNOSIS — Z3A36 36 weeks gestation of pregnancy: Secondary | ICD-10-CM | POA: Diagnosis not present

## 2023-11-30 DIAGNOSIS — Z113 Encounter for screening for infections with a predominantly sexual mode of transmission: Secondary | ICD-10-CM

## 2023-11-30 DIAGNOSIS — E669 Obesity, unspecified: Secondary | ICD-10-CM | POA: Diagnosis not present

## 2023-11-30 DIAGNOSIS — O368130 Decreased fetal movements, third trimester, not applicable or unspecified: Secondary | ICD-10-CM

## 2023-11-30 DIAGNOSIS — O99210 Obesity complicating pregnancy, unspecified trimester: Secondary | ICD-10-CM

## 2023-11-30 DIAGNOSIS — O99824 Streptococcus B carrier state complicating childbirth: Secondary | ICD-10-CM | POA: Diagnosis present

## 2023-11-30 DIAGNOSIS — Z3685 Encounter for antenatal screening for Streptococcus B: Secondary | ICD-10-CM

## 2023-11-30 DIAGNOSIS — Z3480 Encounter for supervision of other normal pregnancy, unspecified trimester: Secondary | ICD-10-CM

## 2023-11-30 DIAGNOSIS — Z3490 Encounter for supervision of normal pregnancy, unspecified, unspecified trimester: Principal | ICD-10-CM

## 2023-11-30 DIAGNOSIS — O99820 Streptococcus B carrier state complicating pregnancy: Secondary | ICD-10-CM | POA: Diagnosis not present

## 2023-11-30 DIAGNOSIS — O368330 Maternal care for abnormalities of the fetal heart rate or rhythm, third trimester, not applicable or unspecified: Secondary | ICD-10-CM | POA: Diagnosis not present

## 2023-11-30 DIAGNOSIS — O99214 Obesity complicating childbirth: Secondary | ICD-10-CM | POA: Diagnosis present

## 2023-11-30 LAB — CBC WITH DIFFERENTIAL/PLATELET
Abs Immature Granulocytes: 0.02 10*3/uL (ref 0.00–0.07)
Basophils Absolute: 0 10*3/uL (ref 0.0–0.1)
Basophils Relative: 1 %
Eosinophils Absolute: 0.2 10*3/uL (ref 0.0–0.5)
Eosinophils Relative: 3 %
HCT: 34.2 % — ABNORMAL LOW (ref 36.0–46.0)
Hemoglobin: 11.8 g/dL — ABNORMAL LOW (ref 12.0–15.0)
Immature Granulocytes: 0 %
Lymphocytes Relative: 29 %
Lymphs Abs: 1.7 10*3/uL (ref 0.7–4.0)
MCH: 31.6 pg (ref 26.0–34.0)
MCHC: 34.5 g/dL (ref 30.0–36.0)
MCV: 91.4 fL (ref 80.0–100.0)
Monocytes Absolute: 0.6 10*3/uL (ref 0.1–1.0)
Monocytes Relative: 11 %
Neutro Abs: 3.2 10*3/uL (ref 1.7–7.7)
Neutrophils Relative %: 56 %
Platelets: 215 10*3/uL (ref 150–400)
RBC: 3.74 MIL/uL — ABNORMAL LOW (ref 3.87–5.11)
RDW: 11.9 % (ref 11.5–15.5)
WBC: 5.7 10*3/uL (ref 4.0–10.5)
nRBC: 0 % (ref 0.0–0.2)

## 2023-11-30 LAB — TYPE AND SCREEN
ABO/RH(D): A POS
Antibody Screen: NEGATIVE

## 2023-11-30 MED ORDER — MISOPROSTOL 50MCG HALF TABLET
50.0000 ug | ORAL_TABLET | ORAL | Status: DC | PRN
  Administered 2023-11-30 – 2023-12-01 (×3): 50 ug via VAGINAL
  Filled 2023-11-30 (×3): qty 1

## 2023-11-30 MED ORDER — ACETAMINOPHEN 325 MG PO TABS: 650.0000 mg | ORAL_TABLET | ORAL | Status: DC | PRN

## 2023-11-30 MED ORDER — PENICILLIN G POTASSIUM 5000000 UNITS IJ SOLR
5.0000 10*6.[IU] | Freq: Once | INTRAMUSCULAR | Status: AC
  Administered 2023-12-01: 5 10*6.[IU] via INTRAVENOUS
  Filled 2023-11-30: qty 5

## 2023-11-30 MED ORDER — OXYTOCIN-SODIUM CHLORIDE 30-0.9 UT/500ML-% IV SOLN
2.5000 [IU]/h | INTRAVENOUS | Status: DC
  Administered 2023-12-01: 2.5 [IU]/h via INTRAVENOUS
  Filled 2023-11-30: qty 500

## 2023-11-30 MED ORDER — LACTATED RINGERS IV SOLN
500.0000 mL | INTRAVENOUS | Status: DC | PRN
  Administered 2023-12-01: 1000 mL via INTRAVENOUS

## 2023-11-30 MED ORDER — LACTATED RINGERS IV SOLN
INTRAVENOUS | Status: DC
Start: 2023-11-30 — End: 2023-12-01

## 2023-11-30 MED ORDER — SOD CITRATE-CITRIC ACID 500-334 MG/5ML PO SOLN: 30.0000 mL | ORAL | Status: DC | PRN

## 2023-11-30 MED ORDER — MISOPROSTOL 25 MCG QUARTER TABLET
25.0000 ug | ORAL_TABLET | ORAL | Status: DC | PRN
  Administered 2023-11-30: 25 ug via ORAL
  Filled 2023-11-30 (×2): qty 1

## 2023-11-30 MED ORDER — OXYTOCIN-SODIUM CHLORIDE 30-0.9 UT/500ML-% IV SOLN
1.0000 m[IU]/min | INTRAVENOUS | Status: DC
  Administered 2023-12-01: 2 m[IU]/min via INTRAVENOUS

## 2023-11-30 MED ORDER — ONDANSETRON HCL 4 MG/2ML IJ SOLN
4.0000 mg | Freq: Four times a day (QID) | INTRAMUSCULAR | Status: DC | PRN
  Administered 2023-12-01: 4 mg via INTRAVENOUS
  Filled 2023-11-30: qty 2

## 2023-11-30 MED ORDER — OXYTOCIN BOLUS FROM INFUSION
333.0000 mL | Freq: Once | INTRAVENOUS | Status: AC
  Administered 2023-12-01: 333 mL via INTRAVENOUS

## 2023-11-30 MED ORDER — FLEET ENEMA RE ENEM: 1.0000 | ENEMA | RECTAL | Status: DC | PRN

## 2023-11-30 MED ORDER — FENTANYL CITRATE (PF) 100 MCG/2ML IJ SOLN
50.0000 ug | INTRAMUSCULAR | Status: DC | PRN
  Administered 2023-12-01 (×2): 100 ug via INTRAVENOUS
  Filled 2023-11-30 (×2): qty 2

## 2023-11-30 MED ORDER — TERBUTALINE SULFATE 1 MG/ML IJ SOLN: 0.2500 mg | Freq: Once | INTRAMUSCULAR | Status: DC | PRN

## 2023-11-30 MED ORDER — PENICILLIN G POT IN DEXTROSE 60000 UNIT/ML IV SOLN
3.0000 10*6.[IU] | INTRAVENOUS | Status: DC
  Filled 2023-11-30: qty 50

## 2023-11-30 MED ORDER — LIDOCAINE HCL (PF) 1 % IJ SOLN: 30.0000 mL | INTRAMUSCULAR | Status: DC | PRN

## 2023-11-30 NOTE — Progress Notes (Signed)
Sandy Burns 2001/08/13 [redacted]w[redacted]d  Fetus A Non-Stress Test Interpretation for 11/30/23  Indication: Decreased Fetal Movement  Fetal Heart Rate A Mode: External Baseline Rate (A): 130 bpm Variability: Moderate Accelerations: 15 x 15 Decelerations: Variable Multiple birth?: No  Uterine Activity Mode: Toco Contraction Frequency (min): None  Interpretation (Fetal Testing) Nonstress Test Interpretation: Reactive Overall Impression: Reassuring for gestational age

## 2023-11-30 NOTE — Progress Notes (Signed)
ROB. She states she has been feeling weak this week and reports decreased fetal movement. GC/CT and GBS cultures preformed at L&D, GBS+. Growth ultrasound and BPP today.

## 2023-11-30 NOTE — Progress Notes (Signed)
Labor Progress Note   ASSESSMENT/PLAN   Sandy Burns 23 y.o.   G3P1011  at [redacted]w[redacted]d here with induction due to non-reassuring fetal status.  FWB:  - Fetal well being assessed: Cat I      GBS: - GBS POSITIVE/-- (02/16 1404)    LABOR: - Now awaiting cervical ripening, doing well. - Pain Management: position changes and breathing - Discussed options with patient and will place a second dose of misoprostol.  - Anticipate SVD.   Active Problems: IUGR -BPP 4/8 with elevated dopplers today in clinic, FHR currently Cat I  Hx of Postpartum Hemorrhage Hx of pre-eclampsia  Labor Progress: 1615 0.5/th/high, Dose#1 oral and vaginal misoprostol 2035 0.5/th/high, Dose #2 vaginal misoprostol  SUBJECTIVE/OBJECTIVE   SUBJECTIVE:  Patient reports feeling some intermittent tightening of belly but nothing overly uncomfortable.   OBJECTIVE: Vital Signs: Patient Vitals for the past 12 hrs:  BP Temp Temp src Pulse Resp Height Weight  11/30/23 1454 136/87 98.6 F (37 C) Oral 81 18 5\' 7"  (1.702 m) 107.9 kg    Last SVE:  Dilation: Fingertip Effacement (%): Thick Cervical Position: Posterior Exam by:: Alaira Level CNM -  ,  ,  ,    FHR:   - Mode: External  - Baseline Rate (A): 165 bpm  -    - Characteristics (ie - accels, decels): Accelerations: 15 x 15  -    UTERINE ACTIVITY:   - Mode: Toco  - Contraction Frequency (min): 3-3.5 minutes

## 2023-11-30 NOTE — H&P (Signed)
History and Physical   HPI  Sandy Burns is a 23 y.o. G3P1011 at [redacted]w[redacted]d Estimated Date of Delivery: 12/24/23 by LMP, who is being admitted for induction of labor secondary to a BPP of 4/8 with elevated doppler resistance today. Reports decreased fetal movement today. Patient was seen in clinic today for a ROB. At that time she reported that something is not right and that her baby was not moving as usual. A BPP was done with a score of 4/8, scored zeros for tone and movement. Had reactive NST in clinic with a variable decel present. Had elevated doppler resistance above the 95th%. Growth scan today showed EFW in 9th% which is decreased from 27th% a month ago. Dr. Logan Burns consulted with Dr. Lora Burns regarding plan of care. Dr. Logan Burns then discussed options of continued fetal surveillance for next couple of days with induction at 37 weeks or proceeding with induction today. Ultimately Sandy Burns decided to be induced today. Denies VB, LOF, HA, visual changes or RUQ pain.   Sandy Burns started prenatal care at 12 weeks. Had consistent and routine care complicated by GBS positive status, varicella non immune status, history of delayed PPH requiring 2u PRBCs and 1u FFP and repair in OR with last baby, history of preeclampsia with previous pregnancy, obesity (BMI currently 37), and asthma. Today baby was diagnosed as IUGR with EFW 9%. Additionally today had a BPP of 4/8 with elevated doppler resistance.    OB History  OB History  Gravida Para Term Preterm AB Living  3 1 1  0 1 1  SAB IAB Ectopic Multiple Live Births  0 1 0 0 1    # Outcome Date GA Lbr Len/2nd Weight Sex Type Anes PTL Lv  3 Current           2 Term 09/05/20 [redacted]w[redacted]d 05:18 / 00:03 2970 g M Vag-Spont EPI, Local  LIV     Name: Sandy Burns     Apgar1: 9  Apgar5: 9  1 IAB 2017     TAB       PROBLEM LIST  Pregnancy complications or risks: Patient Active Problem List   Diagnosis Date Noted   Amniotic fluid leaking 11/26/2023   Hx  of pre-eclampsia in prior pregnancy, currently pregnant 11/26/2023   Hx of postpartum hemorrhage, currently pregnant 11/26/2023   Supervision of other normal pregnancy, antepartum 05/05/2023   Obesity in pregnancy 09/07/2020    Prenatal labs and studies: ABO, Rh: A/Positive/-- (09/06 1036) Antibody: Negative (09/06 1036) Rubella: 1.70 (09/06 1036) RPR: Non Reactive (12/23 1051)  HBsAg: Negative (09/06 1036)  HIV: Non Reactive (12/23 1051)  JYN:WGNFAOZH/-- (02/16 1404) Varicella non immune GC/CT: neg/neg 1hr GTT: 75 on 9/13, 64 on 12/23 H&H:  Hemoglobin  Date Value Ref Range Status  10/02/2023 11.7 11.1 - 15.9 g/dL Final  08/65/7846 96.2 (L) 12.0 - 15.0 g/dL Final  95/28/4132 44.0 (L) 12.0 - 15.0 g/dL Final  08/06/2535 64.4 11.1 - 15.9 g/dL Final  03/47/4259 56.3 11.1 - 15.9 g/dL Final  87/56/4332 95.1 (L) 12.0 - 15.0 g/dL Final  88/41/6606 30.1 12.0 - 15.0 g/dL Final  60/07/9322 55.7 11.1 - 15.9 g/dL Final      Past Medical History:  Diagnosis Date   Anemia    Asthma    Delay 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/07/2020   Eczema    Environmental allergies    History of blood transfusion    after giving birth 09/06/2020   Vaginal vault hematoma 09/07/2020  Past Surgical History:  Procedure Laterality Date   FOOT SURGERY  2015   REPAIR VAGINAL CUFF N/A 09/06/2020   Procedure: REPAIR VAGINAL CUFF;  Surgeon: Sandy Laser, MD;  Location: [facility] ORS;  Service: Gynecology;  Laterality: N/A;   TOOTH EXTRACTION  2015   WISDOM TOOTH EXTRACTION     four; age 55     Medications    Current Discharge Medication List     CONTINUE these medications which have NOT CHANGED   Details  aspirin 81 MG chewable tablet Chew 1 tablet (81 mg total) by mouth daily.   Associated Diagnoses: Supervision of other normal pregnancy, antepartum    cetirizine (ZYRTEC) 10 MG tablet Take 10 mg by mouth daily as needed.    EPIPEN 2-PAK 0.3 MG/0.3ML SOAJ injection Inject 0.3 mg into the muscle as  needed for anaphylaxis. Qty: 1 each, Refills: 0   Associated Diagnoses: Encounter for general adult medical examination with abnormal findings    ferrous sulfate 324 MG TBEC Take 324 mg by mouth daily with breakfast.    fluticasone (FLONASE) 50 MCG/ACT nasal spray SHAKE LIQUID AND USE 1 SPRAY IN EACH NOSTRIL DAILY Qty: 16 g, Refills: 5   Associated Diagnoses: Non-seasonal allergic rhinitis due to other allergic trigger    fluticasone-salmeterol (ADVAIR DISKUS) 100-50 MCG/ACT AEPB INHALE 1 PUFF INTO THE LUNGS TWICE DAILY Qty: 60 each, Refills: 11   Associated Diagnoses: Mild intermittent asthma without complication    montelukast (SINGULAIR) 10 MG tablet TAKE 1 TABLET(10 MG) BY MOUTH AT BEDTIME Qty: 90 tablet, Refills: 3   Associated Diagnoses: Mild intermittent asthma without complication    ondansetron (ZOFRAN-ODT) 4 MG disintegrating tablet Take 1 tablet (4 mg total) by mouth every 8 (eight) hours as needed for nausea or vomiting. Qty: 20 tablet, Refills: 0    prenatal vitamin w/FE, FA (NATACHEW) 29-1 MG CHEW chewable tablet Chew 2 tablets by mouth daily at 12 noon. Qty: 30 tablet, Refills: 9   Associated Diagnoses: Encounter for supervision of normal first pregnancy in first trimester    sertraline (ZOLOFT) 25 MG tablet Take 1 tablet (25 mg total) by mouth daily. Qty: 30 tablet, Refills: 3   Associated Diagnoses: Depression, major, single episode, mild (HCC)    VENTOLIN HFA 108 (90 Base) MCG/ACT inhaler INHALE 2 PUFFS INTO THE LUNGS EVERY 4 HOURS AS NEEDED FOR WHEEZING OR SHORTNESS OF BREATH Qty: 18 g, Refills: 3   Associated Diagnoses: Mild intermittent asthma without complication         Allergies  Other, Cats claw (uncaria tomentosa), Dust mite extract, Molds & smuts, and Tree extract  Review of Systems  Pertinent items noted in HPI and remainder of comprehensive ROS otherwise negative.  Physical Exam  LMP 03/19/2023 (Exact Date)   Lungs:  CTA B Cardio: S1S2,  RRR Abd: Soft, gravid, NT Presentation: cephalic DTRs: 2+ B SVE: closed/thick/posterior  FHR 135, mod variability, pos accels, no decels Toco Ucs q2-35min   Test Results  No results found for this or any previous visit (from the past 24 hours). Group B Strep positive  Assessment  G3P1011 at [redacted]w[redacted]d Estimated Date of Delivery: 12/24/23  RNST IOL for BPP 4/8 with elevated doppler resistance GBS pos  Patient Active Problem List   Diagnosis Date Noted   Amniotic fluid leaking 11/26/2023   Hx of pre-eclampsia in prior pregnancy, currently pregnant 11/26/2023   Hx of postpartum hemorrhage, currently pregnant 11/26/2023   Supervision of other normal pregnancy, antepartum 05/05/2023   Obesity in  pregnancy 09/07/2020    Plan  1. Admit to L&D   2. EFM per unit policy 3. Labs : T&S, CBC, RPR 4. Dr. Valentino Saxon notified of patient admission and status  5. PCN for GBS prophylaxis once in active labor 5. Misoprostol for cervical ripening  Raeford Razor, CNM, FNP 11/30/2023 2:54 PM

## 2023-11-30 NOTE — Progress Notes (Signed)
ROB: 36 weeks 4 days.  Patient reports something is not right and that her baby is not moving well. BPP today 4/8.  Tone and movement 0.  NST reactive with a variable present.  Elevated Doppler resistance above the 95th percentile.  No absent or reversed diastolic flow.  Growth down to 9th percentile which is a significant drop from her last ultrasound. Consult with Dr. Lora Paula -he said if her NST was perfect she could have a BPP tomorrow and an NST over the weekend and deliver at 37 weeks assuming all her tests were normal.  The furthest he is attempting is 37 weeks. He recommends delivery if any of the testing is not "perfect". Up-to-date recommends with abnormal Dopplers, abnormal BPP after 34 weeks = delivery. I spoke with the patient at length regarding multiple options of extended monitoring over the next few days to get the baby slightly further gestational age.  She is certain something is not right and would rather proceed to delivery at this time.  Risks of early delivery including possible NICU stay discussed and she is completely aware of this. Discussed case with [facility] and will proceed with induction of labor today.

## 2023-11-30 NOTE — Patient Instructions (Signed)
Nonstress Test A nonstress test is a procedure that is done during pregnancy in order to check the baby's heartbeat. The procedure can help to show if the baby (fetus) is healthy. It is commonly done when: The baby is past his or her due date. The pregnancy is high risk. The baby is moving less than normal. The mother has lost a pregnancy in the past. The health care provider suspects a problem with the baby's growth. There is too much or too little amniotic fluid. The procedure is often done in the third trimester of pregnancy to find out if an early delivery is needed and whether such a delivery is safe. During a nonstress test, the baby's heartbeat is monitored when the baby is resting and when the baby is moving. If the baby is healthy, the heart rate will increase when he or she moves or kicks and will return to normal when he or she rests. Tell a health care provider about: Any allergies you have. Any medical conditions you have. All medicines you are taking, including vitamins, herbs, eye drops, creams, and over-the-counter medicines. Any surgeries you have had. Any past pregnancies you have had. What are the risks? There are no risks to you or your baby from a nonstress test. This procedure should not be painful or uncomfortable. What happens before the procedure? Eat a meal right before the test or as directed by your health care provider. Food may help encourage the baby to move. Use the restroom right before the test. What happens during the procedure?  Two monitors will be placed on your abdomen. One will record the baby's heart rate and the other will record the contractions of your uterus. You may be asked to lie down on your side or to sit upright. You may be given a button to press when you feel your baby move. Your health care provider will listen to your baby's heartbeat and record it. He or she may also watch your baby's heartbeat on a screen. If the baby seems to be  sleeping, you may be asked to drink some juice or soda, eat a snack, or change positions. The procedure may vary among health care providers and hospitals. What can I expect after procedure? Your health care provider will discuss the test results with you and make recommendations for the future. Depending on the results, your health care provider may order additional tests or another course of action. If your health care provider gave you any diet or activity instructions, make sure to follow them. Keep all follow-up visits. This is important. Summary A nonstress test is a procedure that is done during pregnancy in order to check the baby's heartbeat. The procedure can help show if the baby is healthy. The procedure is often done in the third trimester of pregnancy to find out if an early delivery is needed and whether such a delivery is safe. During a nonstress test, the baby's heartbeat is monitored when the baby is resting and when the baby is moving. If the baby is healthy, the heart rate will increase when he or she moves or kicks and will return to normal when he or she rests. Your health care provider will discuss the test results with you and make recommendations for the future. This information is not intended to replace advice given to you by your health care provider. Make sure you discuss any questions you have with your health care provider. Document Revised: 06/09/2021 Document Reviewed: 07/06/2020 Elsevier Patient  Education  2024 [employer] Fetal Movement Counts Pregnant people often feel their baby's first movements about halfway through the pregnancy. These movements may feel like flutters, rolls, or swishes at first. As the baby grows, pregnant people start noticing more kicks and jabs. Around week 28 of your pregnancy, most health care providers recommend counting how often the baby moves (fetal movement counts). Counting fetal movements is vital for high-risk pregnancies,  aiming to prevent stillbirths, but counting benefits all pregnancies. What is a fetal movement count?  A fetal movement count is the number of times that you feel your baby move during a certain amount of time. This may also be called a fetal kick count. Pay attention to when your baby is most active. You may notice your baby's sleep and wake cycles. You may also notice things that make your baby move more. You should do a fetal movement count: When your baby is normally most active. At the same time each day. A good time to count movements is while resting after eating and drinking. How do I count fetal movements? Find a quiet, comfortable area. Sit or lie down on your left side. Write down the date, the start and stop times, and the number of movements that you felt between those two times. Take this information with you to your health care visits. Write down your start time when you feel the first movement. Count kicks, flutters, swishes, rolls, and jabs. You should feel at least 10 movements within a 2 hour period. You may stop counting after you have felt 10 movements, or if you have been counting for 2 hours. Write down the stop time. If you do not feel 10 movements in 2 hours, you should repeat the count after waiting a few hours. After two attempts without 10 movements, contact your provider for further instructions. Your provider may want to do additional tests to assess your baby's well-being. Contact a health care provider if: You feel fewer than 10 movements in 2 hours after two count attempts a few hours apart. Your baby is not moving as usual, or not at all. This information is not intended to replace advice given to you by your health care provider. Make sure you discuss any questions you have with your health care provider. Document Revised: 10/12/2022 Document Reviewed: 10/12/2022 Elsevier Patient Education  2024 [employer].

## 2023-12-01 ENCOUNTER — Inpatient Hospital Stay: Payer: Medicaid Other | Admitting: Anesthesiology

## 2023-12-01 ENCOUNTER — Ambulatory Visit: Admission: RE | Admit: 2023-12-01 | Payer: Medicaid Other | Source: Ambulatory Visit

## 2023-12-01 ENCOUNTER — Encounter: Payer: Self-pay | Admitting: Family

## 2023-12-01 DIAGNOSIS — O368330 Maternal care for abnormalities of the fetal heart rate or rhythm, third trimester, not applicable or unspecified: Secondary | ICD-10-CM

## 2023-12-01 DIAGNOSIS — O99820 Streptococcus B carrier state complicating pregnancy: Secondary | ICD-10-CM | POA: Diagnosis not present

## 2023-12-01 DIAGNOSIS — O365930 Maternal care for other known or suspected poor fetal growth, third trimester, not applicable or unspecified: Secondary | ICD-10-CM | POA: Diagnosis not present

## 2023-12-01 DIAGNOSIS — Z3A36 36 weeks gestation of pregnancy: Secondary | ICD-10-CM

## 2023-12-01 LAB — RPR: RPR Ser Ql: NONREACTIVE

## 2023-12-01 MED ORDER — FENTANYL-BUPIVACAINE-NACL 0.5-0.125-0.9 MG/250ML-% EP SOLN
EPIDURAL | Status: AC
  Filled 2023-12-01: qty 250

## 2023-12-01 MED ORDER — DIPHENHYDRAMINE HCL 25 MG PO CAPS: 25.0000 mg | ORAL_CAPSULE | Freq: Four times a day (QID) | ORAL | Status: DC | PRN

## 2023-12-01 MED ORDER — ACETAMINOPHEN 325 MG PO TABS
650.0000 mg | ORAL_TABLET | ORAL | Status: DC | PRN
  Administered 2023-12-01 (×2): 650 mg via ORAL
  Filled 2023-12-01 (×2): qty 2

## 2023-12-01 MED ORDER — OXYTOCIN-SODIUM CHLORIDE 30-0.9 UT/500ML-% IV SOLN: 2.5000 [IU]/h | INTRAVENOUS | Status: DC | PRN

## 2023-12-01 MED ORDER — PHENYLEPHRINE 80 MCG/ML (10ML) SYRINGE FOR IV PUSH (FOR BLOOD PRESSURE SUPPORT): 80.0000 ug | PREFILLED_SYRINGE | INTRAVENOUS | Status: DC | PRN

## 2023-12-01 MED ORDER — LIDOCAINE HCL (PF) 1 % IJ SOLN
INTRAMUSCULAR | Status: DC | PRN
  Administered 2023-12-01: 1 mL

## 2023-12-01 MED ORDER — OXYCODONE-ACETAMINOPHEN 5-325 MG PO TABS
1.0000 | ORAL_TABLET | Freq: Four times a day (QID) | ORAL | Status: DC | PRN
  Administered 2023-12-01 – 2023-12-03 (×5): 1 via ORAL
  Filled 2023-12-01 (×5): qty 1

## 2023-12-01 MED ORDER — LIDOCAINE-EPINEPHRINE (PF) 1.5 %-1:200000 IJ SOLN
INTRAMUSCULAR | Status: DC | PRN
  Administered 2023-12-01: 3 mL via EPIDURAL

## 2023-12-01 MED ORDER — FENTANYL-BUPIVACAINE-NACL 0.5-0.125-0.9 MG/250ML-% EP SOLN
12.0000 mL/h | EPIDURAL | Status: DC | PRN
  Administered 2023-12-01: 12 mL/h via EPIDURAL

## 2023-12-01 MED ORDER — EPHEDRINE 5 MG/ML INJ
10.0000 mg | INTRAVENOUS | Status: DC | PRN
  Administered 2023-12-01: 10 mg via INTRAVENOUS

## 2023-12-01 MED ORDER — VARICELLA VIRUS VACCINE LIVE 1350 PFU/0.5ML IJ SUSR
0.5000 mL | Freq: Once | INTRAMUSCULAR | Status: DC
  Filled 2023-12-01: qty 0.5

## 2023-12-01 MED ORDER — COCONUT OIL OIL
1.0000 | TOPICAL_OIL | Status: DC | PRN
  Administered 2023-12-01: 1 via TOPICAL
  Filled 2023-12-01: qty 22.5

## 2023-12-01 MED ORDER — SERTRALINE HCL 25 MG PO TABS
25.0000 mg | ORAL_TABLET | Freq: Every day | ORAL | Status: DC
  Administered 2023-12-01 – 2023-12-03 (×3): 25 mg via ORAL
  Filled 2023-12-01 (×3): qty 1

## 2023-12-01 MED ORDER — DOCUSATE SODIUM 100 MG PO CAPS
100.0000 mg | ORAL_CAPSULE | Freq: Two times a day (BID) | ORAL | Status: DC
  Administered 2023-12-01 – 2023-12-03 (×4): 100 mg via ORAL
  Filled 2023-12-01 (×4): qty 1

## 2023-12-01 MED ORDER — DIPHENHYDRAMINE HCL 50 MG/ML IJ SOLN: 12.5000 mg | INTRAMUSCULAR | Status: DC | PRN

## 2023-12-01 MED ORDER — LACTATED RINGERS IV SOLN: 500.0000 mL | Freq: Once | INTRAVENOUS | Status: DC

## 2023-12-01 MED ORDER — EPHEDRINE 5 MG/ML INJ
10.0000 mg | INTRAVENOUS | Status: DC | PRN
  Administered 2023-12-01: 10 mg via INTRAVENOUS
  Filled 2023-12-01: qty 5

## 2023-12-01 MED ORDER — BENZOCAINE-MENTHOL 20-0.5 % EX AERO
1.0000 | INHALATION_SPRAY | CUTANEOUS | Status: DC | PRN
  Administered 2023-12-01: 1 via TOPICAL
  Filled 2023-12-01 (×2): qty 56

## 2023-12-01 MED ORDER — IBUPROFEN 600 MG PO TABS
600.0000 mg | ORAL_TABLET | Freq: Four times a day (QID) | ORAL | Status: DC
  Administered 2023-12-01 – 2023-12-03 (×9): 600 mg via ORAL
  Filled 2023-12-01 (×9): qty 1

## 2023-12-01 MED ORDER — SIMETHICONE 80 MG PO CHEW: 80.0000 mg | CHEWABLE_TABLET | ORAL | Status: DC | PRN

## 2023-12-01 MED ORDER — PRENATAL MULTIVITAMIN CH
1.0000 | ORAL_TABLET | Freq: Every day | ORAL | Status: DC
  Administered 2023-12-01 – 2023-12-02 (×2): 1 via ORAL
  Filled 2023-12-01 (×2): qty 1

## 2023-12-01 NOTE — Anesthesia Preprocedure Evaluation (Signed)
Anesthesia Evaluation  Patient identified by MRN, date of birth, ID band Patient awake    Reviewed: Allergy & Precautions, H&P , NPO status , Patient's Chart, lab work & pertinent test results  Airway Mallampati: II  TM Distance: >3 FB Neck ROM: full    Dental no notable dental hx.    Pulmonary neg pulmonary ROS   Pulmonary exam normal        Cardiovascular Exercise Tolerance: Good negative cardio ROS Normal cardiovascular exam     Neuro/Psych    GI/Hepatic negative GI ROS,,,  Endo/Other    Renal/GU   negative genitourinary   Musculoskeletal   Abdominal  (+) + obese  Peds  Hematology  (+) Blood dyscrasia, anemia   Anesthesia Other Findings G3P1011  at [redacted]w[redacted]d here with induction due to non-reassuring fetal status. H/o pre-e and pph.  Past Medical History: No date: Anemia No date: Asthma 09/07/2020: Delay postpartum hemorrhage No date: Eczema No date: Environmental allergies No date: History of blood transfusion     Comment:  after giving birth 09/06/2020 09/07/2020: Vaginal vault hematoma  Past Surgical History: 2015: FOOT SURGERY 09/06/2020: REPAIR VAGINAL CUFF; N/A     Comment:  Procedure: REPAIR VAGINAL CUFF;  Surgeon: Hildred Laser,              MD;  Location: [facility] ORS;  Service: Gynecology;                Laterality: N/A; 2015: TOOTH EXTRACTION No date: WISDOM TOOTH EXTRACTION     Comment:  four; age 69  BMI    Body Mass Index: 37.26 kg/m      Reproductive/Obstetrics (+) Pregnancy                              Anesthesia Physical Anesthesia Plan  ASA: 2  Anesthesia Plan: Epidural   Post-op Pain Management:    Induction:   PONV Risk Score and Plan:   Airway Management Planned:   Additional Equipment:   Intra-op Plan:   Post-operative Plan:   Informed Consent: I have reviewed the patients History and Physical, chart, labs and discussed the procedure  including the risks, benefits and alternatives for the proposed anesthesia with the patient or authorized representative who has indicated his/her understanding and acceptance.       Plan Discussed with: Anesthesiologist and CRNA  Anesthesia Plan Comments:          Anesthesia Quick Evaluation

## 2023-12-01 NOTE — Lactation Note (Signed)
This note was copied from a baby's chart. Lactation Consultation Note  Patient Name: Sandy Burns [id number] Date: 12/01/2023 Age:24 hours Reason for consult: Follow-up assessment;Late-preterm 34-36.6wks;Exclusive pumping and bottle feeding   Maternal Data Follow up assessment and assistance w/ using breast pump. Upon entry into room patient stated that she tried using the pump for 5 minutes and didn't get anything.   Feeding Mother's Current Feeding Choice: Breast Milk and Formula Nipple Type: Slow - flow  Lactation Tools Discussed/Used LC provided education on how to use the DEBP.  Patient is currently, using a size 18mm flange but states that she is not feeling a strong tugging feeling while pumping.  Education was provided on the hand pump and immediately patient saw drops of colostrum.  If patient chooses to use the manual pump, LC recommended about 10 minutes per breast then feed back any drops of colostrum to infant. Patient verbalized understanding.   Interventions Interventions: Education  Consult Status Consult Status: Follow-up Date: 12/02/23 Follow-up type: In-patient    Yvette Rack Ewell Benassi 12/01/2023, 5:46 PM

## 2023-12-01 NOTE — Significant Event (Signed)
This CNM and RN at bedside for deceleration at 0620. Patient's blood pressure cycled, 119/60, which is significantly lower than her blood pressures prior to getting an epidural (in the 130s/80s). RN will give ephedrine and continue to monitor fetal status and maternal BP.

## 2023-12-01 NOTE — Discharge Summary (Signed)
 Postpartum Discharge Summary  Date of Service updated: 12/03/2023     Patient Name: Sandy Burns DOB: 19-Jan-2001 MRN: [id number]  Date of admission: 11/30/2023 Delivery date:12/01/2023 Delivering provider: FREE, Lindalou Hose Date of discharge: 12/03/2023  Admitting diagnosis: [redacted] weeks gestation of pregnancy [Z3A.36] Intrauterine pregnancy: [redacted]w[redacted]d     Secondary diagnosis:  Principal Problem:   Encounter for induction of labor Active Problems:   Supervision of other normal pregnancy, antepartum   Hx of pre-eclampsia in prior pregnancy, currently pregnant   Hx of postpartum hemorrhage, currently pregnant   Postpartum care following vaginal delivery   Encounter for care or examination of lactating mother  Additional problems: IUGR    Discharge diagnosis:  Preterm pregnancy, delivered                                               Post partum procedures: none Augmentation: AROM, Pitocin, Cytotec, and IP Foley Complications: None  Hospital course: Induction of Labor With Vaginal Delivery   23 y.o. yo [id number] at [redacted]w[redacted]d was admitted to the hospital 11/30/2023 for induction of labor.  Indication for induction:  non-reassuring fetal status .  Patient had an uncomplicated labor course. Membrane Rupture Time/Date: 7:29 AM,12/01/2023  Delivery Method:Vaginal, Spontaneous Operative Delivery:N/A Episiotomy: None Lacerations:  None Details of delivery can be found in separate delivery note.    Patient had a postpartum course complicated by NA. She is tolerating regular diet, pain is controlled with PO medications, ambulating and voiding without difficulty. She reports both breast and formula feeding.   Patient is discharged home 12/03/23.  Newborn Data: Birth date:12/01/2023 Birth time:8:17 AM Gender:Female   Zal[place] Living status:Living Apgars:8 ,9  Weight:2460 g  Magnesium Sulfate received: No BMZ received: No Rhophylac:N/A MMR:N/A T-DaP:Given prenatally Flu: Yes RSV Vaccine received:  Yes Transfusion:No Immunizations administered: Immunization History  Administered Date(s) Administered   Influenza Inj Mdck Quad Pf 07/12/2022   Influenza, High Dose Seasonal PF 10/06/2016, 10/05/2017, 11/01/2018, 10/15/2020   Influenza, Seasonal, Injecte, Preservative Fre 07/14/2023   Influenza,inj,Quad PF,6+ Mos 06/30/2020   Moderna Sars-Covid-2 Vaccination 10/16/2020, 11/27/2020   Rsv, Bivalent, Protein Subunit Rsvpref,pf Verdis Frederickson) 11/08/2023   Tdap 06/30/2020, 10/02/2023    Physical exam  Vitals:   12/02/23 1457 12/03/23 0022 12/03/23 0332 12/03/23 0721  BP: (!) 131/91 (!) 133/92 139/85 (!) 137/98  Pulse: 71 75 76 66  Resp: 18 20 20 18   Temp: 97.9 F (36.6 C) 97.9 F (36.6 C) 97.6 F (36.4 C) 98.3 F (36.8 C)  TempSrc: Oral Oral Oral Oral  SpO2:  96% 94% 97%  Weight:      Height:       General: alert, cooperative, and no distress Lochia: appropriate Uterine Fundus: firm Incision: N/A DVT Evaluation: No evidence of DVT seen on physical exam. Labs: Lab Results  Component Value Date   WBC 5.7 11/30/2023   HGB 11.8 (L) 11/30/2023   HCT 34.2 (L) 11/30/2023   MCV 91.4 11/30/2023   PLT 215 11/30/2023      Latest Ref Rng & Units 08/05/2023    4:34 AM  CMP  Glucose 70 - 99 mg/dL 88   BUN 6 - 20 mg/dL 10   Creatinine 5.78 - 1.00 mg/dL 4.69   Sodium 629 - 528 mmol/L 137   Potassium 3.5 - 5.1 mmol/L 3.7   Chloride 98 - 111 mmol/L 108  CO2 22 - 32 mmol/L 20   Calcium 8.9 - 10.3 mg/dL 8.6   Total Protein 6.5 - 8.1 g/dL 6.9   Total Bilirubin 0.3 - 1.2 mg/dL 0.4   Alkaline Phos 38 - 126 U/L 56   AST 15 - 41 U/L 17   ALT 0 - 44 U/L 13    Edinburgh Score:    12/02/2023    4:12 PM  Edinburgh Postnatal Depression Scale Screening Tool  I have been able to laugh and see the funny side of things. 0  I have looked forward with enjoyment to things. 0  I have blamed myself unnecessarily when things went wrong. 2  I have been anxious or worried for no good reason. 2  I  have felt scared or panicky for no good reason. 1  Things have been getting on top of me. 1  I have been so unhappy that I have had difficulty sleeping. 1  I have felt sad or miserable. 0  I have been so unhappy that I have been crying. 0  The thought of harming myself has occurred to me. 0  Edinburgh Postnatal Depression Scale Total 7      After visit meds:  Allergies as of 12/03/2023       Reactions   Other Anaphylaxis   Bee stings   Cats Claw (uncaria Tomentosa)    cats   Dust Mite Extract    Molds & Smuts    Tree Extract         Medication List     STOP taking these medications    aspirin 81 MG chewable tablet   ferrous sulfate 324 MG Tbec   montelukast 10 MG tablet Commonly known as: SINGULAIR   ondansetron 4 MG disintegrating tablet Commonly known as: ZOFRAN-ODT       TAKE these medications    cetirizine 10 MG tablet Commonly known as: ZYRTEC Take 10 mg by mouth daily as needed.   EpiPen 2-Pak 0.3 MG/0.3ML Soaj injection Generic drug: EPINEPHrine Inject 0.3 mg into the muscle as needed for anaphylaxis.   fluticasone 50 MCG/ACT nasal spray Commonly known as: FLONASE SHAKE LIQUID AND USE 1 SPRAY IN EACH NOSTRIL DAILY   fluticasone-salmeterol 100-50 MCG/ACT Aepb Commonly known as: Advair Diskus INHALE 1 PUFF INTO THE LUNGS TWICE DAILY   ibuprofen 600 MG tablet Commonly known as: ADVIL Take 1 tablet (600 mg total) by mouth every 6 (six) hours.   NIFEdipine 30 MG 24 hr tablet Commonly known as: ADALAT CC Take 1 tablet (30 mg total) by mouth daily. Start taking on: December 04, 2023   prenatal vitamin w/FE, FA 29-1 MG Chew chewable tablet Chew 2 tablets by mouth daily at 12 noon.   sertraline 25 MG tablet Commonly known as: ZOLOFT Take 1 tablet (25 mg total) by mouth daily.   Ventolin HFA 108 (90 Base) MCG/ACT inhaler Generic drug: albuterol INHALE 2 PUFFS INTO THE LUNGS EVERY 4 HOURS AS NEEDED FOR WHEEZING OR SHORTNESS OF BREATH          Discharge home in stable condition Infant Feeding:  breast and formula Infant Disposition:home with mother Discharge instruction: per After Visit Summary and Postpartum booklet. Activity: Advance as tolerated. Pelvic rest for 6 weeks.  Diet: routine diet Anticipated Birth Control: IUD Mirena Postpartum Appointment:6 weeks Additional Postpartum F/U: Postpartum Depression checkup Future Appointments: Future Appointments  Date Time Provider Department Center  12/05/2023  8:15 AM Deirdre Evener, MD [facility] None   Follow up  Visit:  Follow-up Information     Free, Lindalou Hose, CNM. Schedule an appointment as soon as possible for a visit.   Specialty: Obstetrics and Gynecology Why: In two weeks for BP check and in 6 weeks for PP/IUD insertion Contact information: [address] [place] [address] [phone]                     12/03/2023 Tresea Mall, CNM

## 2023-12-01 NOTE — Progress Notes (Signed)
Labor Progress Note   ASSESSMENT/PLAN   Sandy Burns 23 y.o.   G3P1011  at [redacted]w[redacted]d here with induction of labor for non-reassuring fetal status.  FWB:  - Fetal well being assessed: Cat II, deep variables, maternal repositioning, pitocin stopped.     GBS: - GBS POSITIVE/-- (02/16 1404) , has received one dose of penicillin for prophylaxis.  LABOR: - Now in active labor, doing well. - Pain Management: epidural - Discussed options with patient and performed AROM to bloody fluid. Will continue to monitor fetal heart rate and titrate pitocin if able. Discussed with patient the current status of her labor and signs of possible fetal distress. Patient asking what will happen if baby continues to show distress and I told her we are trying to help her labor progress so she can have a vaginal delivery but if baby continues to show signs of distress, we may have to consider cesarean delivery.  - Cautious for SVD   Active Problems: IUGR -BPP 4/8 with elevated dopplers today in clinic   Hx of Postpartum Hemorrhage Hx of pre-eclampsia   Labor Progress: 1615 0.5/th/high, Dose#1 oral and vaginal misoprostol 2035 0.5/th/high, Dose #2 vaginal misoprostol  0015 1.5/50/high, Dose #3 vaginal misoprostol, Cook catheter placed 0540 Cook catheter out 0600 Pit and PCN started 0730 6/80/-2, AROM to bloody fluid  SUBJECTIVE/OBJECTIVE   SUBJECTIVE:   Remains comfortable with epidural.   OBJECTIVE: Vital Signs: Patient Vitals for the past 12 hrs:  BP Temp Temp src Pulse Resp SpO2  12/01/23 0701 (!) 121/55 97.6 F (36.4 C) Oral (!) 102 18 --  12/01/23 0700 -- -- -- -- -- 98 %  12/01/23 0655 (!) 115/55 -- -- 85 -- 97 %  12/01/23 0650 -- -- -- -- -- 97 %  12/01/23 0645 (!) 124/53 -- -- 87 -- 97 %  12/01/23 0640 -- -- -- -- -- 97 %  12/01/23 0635 (!) 126/58 -- -- 84 -- 98 %  12/01/23 0631 119/61 -- -- 78 -- --  12/01/23 0630 -- -- -- -- -- 97 %  12/01/23 0627 126/65 -- -- 80 -- --  12/01/23 0625 --  -- -- -- -- 95 %  12/01/23 0621 119/61 -- -- 74 -- --  12/01/23 0620 -- -- -- -- -- 96 %  12/01/23 0615 -- -- -- -- -- 96 %  12/01/23 0610 -- -- -- -- -- 96 %  12/01/23 0607 (!) 115/50 -- -- 75 -- --  12/01/23 0605 -- -- -- -- -- 97 %  12/01/23 0601 116/60 -- -- 69 -- --  12/01/23 0600 -- -- -- -- -- 97 %  12/01/23 0555 -- -- -- -- -- 98 %  12/01/23 0553 105/88 -- -- 81 -- --  12/01/23 0550 -- -- -- -- -- 97 %  12/01/23 0545 -- -- -- -- -- 97 %  12/01/23 0540 -- -- -- -- -- 97 %  12/01/23 0537 123/72 -- -- 80 -- --  12/01/23 0535 -- -- -- -- -- 95 %  12/01/23 0530 -- -- -- -- -- 95 %  12/01/23 0525 -- -- -- -- -- 96 %  12/01/23 0522 120/71 -- -- 82 -- --  12/01/23 0520 -- -- -- -- -- 97 %  12/01/23 0515 -- -- -- -- -- 98 %  12/01/23 0510 -- -- -- -- -- 97 %  12/01/23 0507 124/70 -- -- 84 -- --  12/01/23 0505 -- -- -- -- -- 97 %  12/01/23 0502 -- -- -- 84 -- --  12/01/23 0500 125/74 -- -- -- -- 97 %  12/01/23 0457 121/72 -- -- 85 -- --  12/01/23 0455 -- -- -- -- -- 98 %  12/01/23 0452 123/73 -- -- 84 -- --  12/01/23 0450 -- -- -- -- -- 99 %  12/01/23 0447 136/69 -- -- 89 -- --  12/01/23 0445 132/68 98.3 F (36.8 C) Oral 90 -- 97 %  12/01/23 0115 135/81 98.5 F (36.9 C) Oral 71 -- --    Last SVE:  Dilation: 6 Effacement (%): 80 Cervical Position: Posterior Station: -2 Presentation: Vertex Exam by:: Tammy Wickliffe CNM -  , Rupture Date: 12/01/23, Rupture Time: 0729,    FHR:   - Mode: External  - Baseline Rate (A): 165 bpm  -    - Characteristics (ie - accels, decels): Accelerations: 15 x 15  -    UTERINE ACTIVITY:   - Mode: Toco  - Contraction Frequency (min): 1-2.5 minutes Pitocin: stopped

## 2023-12-01 NOTE — Progress Notes (Addendum)
Labor Progress Note   ASSESSMENT/PLAN   Sandy Burns 23 y.o.   G3P1011  at [redacted]w[redacted]d here with induction for non-reassuring fetal status.  FWB:  - Fetal well being assessed: Cat I      GBS: - GBS POSITIVE/-- (02/16 1404), plan for prophylaxis with penicillin with active labor  LABOR: - Now awaiting active labor, doing well. - Pain Management: IV fentanyl - Discussed options with patient and placed Cook catheter with third dose of misoprostol. Patient placed in frog-leg position, cervix located digitally and catheter advance without issue. Uterine and vaginal balloons filled with 80 mL NS each. Patient tolerated placement well.  - Anticipate SVD   Active Problems: IUGR -BPP 4/8 with elevated dopplers today in clinic, FHR currently Cat I   Hx of Postpartum Hemorrhage Hx of pre-eclampsia   Labor Progress: 1615 0.5/th/high, Dose#1 oral and vaginal misoprostol 2035 0.5/th/high, Dose #2 vaginal misoprostol 0015 1.5/50/high, Cook catheter placed, Dose #3 vaginal misoprostol  SUBJECTIVE/OBJECTIVE   SUBJECTIVE:  Feeling crampy, like "period cramps".   OBJECTIVE: Vital Signs: Patient Vitals for the past 12 hrs:  BP Temp Temp src Pulse Resp Height Weight  11/30/23 1454 136/87 98.6 F (37 C) Oral 81 18 5\' 7"  (1.702 m) 107.9 kg    Last SVE:  Dilation: 1.5 Effacement (%): 50 Cervical Position: Posterior Exam by:: Konstance Happel CNM -  ,  ,  ,    FHR:   - Mode: External  - Baseline Rate (A): 135 bpm  -    - Characteristics (ie - accels, decels): Accelerations: 15 x 15  -    UTERINE ACTIVITY:   - Mode: Toco  - Contraction Frequency (min): UI minutes

## 2023-12-01 NOTE — Progress Notes (Signed)
Labor Progress Note   ASSESSMENT/PLAN   Sandy Burns 23 y.o.   G3P1011  at [redacted]w[redacted]d here with induction of labor for non-reassuring fetal status.  FWB:  - Fetal well being assessed: Cat II, maternal repositioning to left side      GBS: - GBS POSITIVE/-- (02/16 1404), starting prophylaxis with penicillin now  LABOR: - Now with cervical ripening, awaiting active labor, doing well. - Pain Management: epidural - Cook catheter out at 0540. Discussed options with patient and will start pitocin and penicillin and consider AROM with regular contraction pattern. - Anticipate SVD.   Active Problems: IUGR -BPP 4/8 with elevated dopplers today in clinic, FHR currently Cat I   Hx of Postpartum Hemorrhage Hx of pre-eclampsia   Labor Progress: 1615 0.5/th/high, Dose#1 oral and vaginal misoprostol 2035 0.5/th/high, Dose #2 vaginal misoprostol 0015 1.5/50/high, Dose #3 vaginal misoprostol, Cook catheter placed 0540 Cook catheter out 0600 Pit and PCN started  SUBJECTIVE/OBJECTIVE   SUBJECTIVE:  Comfortable with epidural, trying to rest.    OBJECTIVE: Vital Signs: Patient Vitals for the past 12 hrs:  BP Temp Temp src Pulse  12/01/23 0500 125/74 -- -- --  12/01/23 0445 132/68 98.3 F (36.8 C) Oral 90  12/01/23 0115 135/81 98.5 F (36.9 C) Oral 71  11/30/23 1908 (!) 140/78 98.1 F (36.7 C) Oral 90    Last SVE:  Dilation: 1.5 Effacement (%): 50 Cervical Position: Posterior Exam by:: Joylene Wescott CNM -  ,  ,  ,    FHR:   - Mode: External  - Baseline Rate (A): 135 bpm  -    - Characteristics (ie - accels, decels): Accelerations: 15 x 15  -    UTERINE ACTIVITY:   - Mode: Toco  - Contraction Frequency (min): 1-3 minutes

## 2023-12-01 NOTE — Anesthesia Procedure Notes (Signed)
Epidural Patient location during procedure: OB Start time: 12/01/2023 4:34 AM End time: 12/01/2023 4:52 AM  Staffing Anesthesiologist: Foye Deer, MD Performed: anesthesiologist   Preanesthetic Checklist Completed: patient identified, IV checked, site marked, risks and benefits discussed, surgical consent, monitors and equipment checked, pre-op evaluation and timeout performed  Epidural Patient position: sitting Prep: ChloraPrep Patient monitoring: heart rate, continuous pulse ox and blood pressure Approach: midline Location: L3-L4 Injection technique: LOR saline  Needle:  Needle type: Tuohy  Needle gauge: 18 G Needle length: 9 cm Needle insertion depth: 8 cm Catheter type: closed end Catheter size: 20 Guage Catheter at skin depth: 13 cm Test dose: negative and 1.5% lidocaine with Epi 1:200 K  Assessment Events: blood not aspirated, no cerebrospinal fluid, injection not painful, no injection resistance and no paresthesia  Additional Notes Reason for block:procedure for pain

## 2023-12-01 NOTE — Lactation Note (Signed)
This note was copied from a baby's chart. Lactation Consultation Note  Patient Name: Girl Lyndsie Wallman [id number] Date: 12/01/2023 Age:24 hours Reason for consult: Initial assessment;Late-preterm 34-36.6wks;Exclusive pumping and bottle feeding   Maternal Data Does the patient have breastfeeding experience prior to this delivery?: No  Initial assessment w/ a P2 patient and a 5hr old baby girl.  This was a SVD.  Patient w/ a hx of pre-E in prior pregnancy.  Patient stated that her feeding plan is to exclusively pump.   Patient expressed that she has not yet ordered a pump because infant was early.  Patient does have WIC in [place].  Feeding Mother's Current Feeding Choice: Breast Milk and Formula Nipple Type: Slow - flow  Lactation Tools Discussed/Used Tools: Pump Breast pump type: Double-Electric Breast Pump Pump Education: Setup, frequency, and cleaning;Milk Storage Reason for Pumping: Patient has chosen to exclusively pump. Pumping frequency: q3  LC set patient up w/ a DEBP.  Patient has not started pumping yet because patient/infant were doing STS because of body temperature. LC will return to show patient how to pump.  Interventions Interventions: Breast feeding basics reviewed;DEBP;Education  LC provided education on the following;  milk production expectations, hunger cues, day 1/2 wet/dirty diapers, hand expression, cluster feeding, benefits of STS and arousing infant for a feeding.  Lactation informed patient of feeding infant at least 8 or more times w/in a 24hr period but not exceeding 3hrs. Patient verbalized understanding.   Discharge Pump: Advised to call insurance company [facility] Program: Yes  Consult Status Consult Status: Follow-up Follow-up type: In-patient    Yvette Rack Usiel Astarita 12/01/2023, 4:11 PM

## 2023-12-02 MED ORDER — LORATADINE 10 MG PO TABS
10.0000 mg | ORAL_TABLET | Freq: Every day | ORAL | Status: DC
  Administered 2023-12-03: 10 mg via ORAL
  Filled 2023-12-02 (×2): qty 1

## 2023-12-02 MED ORDER — LORATADINE 10 MG PO TABS: 10.0000 mg | ORAL_TABLET | Freq: Every day | ORAL | Status: DC

## 2023-12-02 MED ORDER — NIFEDIPINE ER OSMOTIC RELEASE 30 MG PO TB24
30.0000 mg | ORAL_TABLET | Freq: Every day | ORAL | Status: DC
  Administered 2023-12-03: 30 mg via ORAL
  Filled 2023-12-02 (×2): qty 1

## 2023-12-02 MED ORDER — NIFEDIPINE ER OSMOTIC RELEASE 30 MG PO TB24: 30.0000 mg | ORAL_TABLET | Freq: Every day | ORAL | Status: DC

## 2023-12-02 NOTE — Anesthesia Postprocedure Evaluation (Signed)
 Anesthesia Post Note  Patient: Sandy Burns  Procedure(s) Performed: AN AD HOC LABOR EPIDURAL  Patient location during evaluation: Mother Baby Anesthesia Type: Epidural Level of consciousness: awake and alert Pain management: pain level controlled Vital Signs Assessment: post-procedure vital signs reviewed and stable Respiratory status: spontaneous breathing, nonlabored ventilation and respiratory function stable Cardiovascular status: stable Postop Assessment: no headache, no backache, patient able to bend at knees and able to ambulate Anesthetic complications: no   No notable events documented.   Last Vitals:  Vitals:   12/02/23 0348 12/02/23 0852  BP: 127/75 (!) 123/92  Pulse: 90   Resp: 20 20  Temp: 36.8 C 36.6 C  SpO2: 96% 98%    Last Pain:  Vitals:   12/02/23 0852  TempSrc: Oral  PainSc:                  Cleda Mccreedy Ogechi Kuehnel

## 2023-12-02 NOTE — Progress Notes (Signed)
 Post Partum Day 1 Subjective: Sandy Burns is doing well; ambulating in room, urinating and had bowel movement without difficulty. She reports that her cramps are "intense" but has been getting relief from percocet. Baby is skin to skin, and family is at the bedside. She has been bottle feeding and pumping.   Objective: Blood pressure (!) 131/91, pulse 71, temperature 97.9 F (36.6 C), temperature source Oral, resp. rate 18, height 5\' 7"  (1.702 m), weight 107.9 kg, last menstrual period 03/19/2023, SpO2 98%, unknown if currently breastfeeding.  Physical Exam:  General: alert and cooperative Cardiac: RRR, S1, S2, no rubs, murmurs, or gallops auscultated.  Resp: CTAB Lochia: appropriate Uterine Fundus: firm Breast: intact skin, no erythema noted Perineum: no swelling noted.  DVT Evaluation: No evidence of DVT seen on physical exam. Negative Homan's sign. No cords or calf tenderness. No significant calf/ankle edema.  Recent Labs    11/30/23 1525  HGB 11.8*  HCT 34.2*    Assessment/Plan: Plan for discharge tomorrow Continue routine postpartum care.  Sandy Burns would like an IUD at postpartum visit.  Postpartum warning signs and usual course of uterine involution discussed.    LOS: 2 days   Eloise Levels, Student-MidWife 12/02/2023, 3:00 PM  Carie Caddy, CNM present for all portions of care.

## 2023-12-02 NOTE — Lactation Note (Signed)
 This note was copied from a baby's chart. Lactation Consultation Note  Patient Name: Sandy Burns [id number] Date: 12/02/2023 Age:24 hours Reason for consult: Follow-up assessment;Primapara;Exclusive pumping and bottle feeding;Term   Maternal Data Does the patient have breastfeeding experience prior to this delivery?: No  Feeding Mother's Current Feeding Choice: Breast Milk and Formula Nipple Type: Slow - flow Mom exclusively pumping with manual [place] breast pump, prefers to use manual currently, states she has been pumping every time baby is fed a bottle of formula, states she has only been getting drops of colostrum, encouraged to continue to pumping to assist with increasing milk supply, may want to try the DEBP today to see if she likes it better today.  LATCH Score                    Lactation Tools Discussed/Used Tools: Pump Breast pump type: Manual Reason for Pumping: chooses to pump and bottle feed EBM Pumping frequency: at least every time baby feeds, or 8x per 24 hrs Pumped volume:  (states has not gotten many drops recently)  Interventions Interventions: Hand pump;Education LC Name updated on white board  Discharge Pump: Manual (mom prefers manual pump at this time) [facility] Program: Yes  Consult Status Consult Status: PRN Date: 12/02/23 Follow-up type: In-patient    Dyann Kief 12/02/2023, 11:58 AM

## 2023-12-02 NOTE — Plan of Care (Signed)
 Patient is progressing well.  Bonding well with infant at the bedside. Pain is controlled with ibuprofen and PRN percocet.  Jeronimo Norma, RN 12/02/23 @1650 

## 2023-12-02 NOTE — Discharge Instructions (Signed)
 Discharge Instructions:   If there are any new medications, they have been ordered and will be available for pickup at the listed pharmacy on your way home from the hospital.   Call office if you have any of the following: headache, visual changes, fever >101.0 F, chills, shortness of breath, breast concerns, excessive vaginal bleeding, incision drainage or problems, leg pain or redness, depression or any other concerns. If you have vaginal discharge with an odor, let your doctor know.   It is normal to bleed for up to 6 weeks. You should not soak through more than 1 pad in 1 hour. If you have a blood clot larger than your fist with continued bleeding, call your doctor.   Activity: Do not lift > 10 lbs for 6 weeks (do not lift anything heavier than your baby). No intercourse, tampons, swimming pools, hot tubs, baths (only showers) for 6 weeks.  No driving for 1-2 weeks. Continue prenatal vitamin, especially if breastfeeding. Increase calories and fluids (water) while breastfeeding.   Your milk will come in, in the next couple of days (right now it is colostrum). You may have a slight fever when your milk comes in, but it should go away on its own.  If it does not, and rises above 101 F please call the doctor. You will also feel achy and your breasts will be firm. They will also start to leak. If you are breastfeeding, continue as you have been and you can pump/express milk for comfort.   If you have too much milk, your breasts can become engorged, which could lead to mastitis. This is an infection of the milk ducts. It can be very painful and you will need to notify your doctor to obtain a prescription for antibiotics. You can also treat it with a shower or hot/cold compress.   For concerns about your baby, please call your pediatrician.  For breastfeeding concerns, the lactation consultant can be reached at [phone].   Postpartum blues (feelings of happy one minute and sad another minute)  are normal for the first few weeks but if it gets worse let your doctor know.   Congratulations! We enjoyed caring for you and your new bundle of joy!

## 2023-12-03 DIAGNOSIS — Z3490 Encounter for supervision of normal pregnancy, unspecified, unspecified trimester: Principal | ICD-10-CM

## 2023-12-03 MED ORDER — IBUPROFEN 600 MG PO TABS: 600.0000 mg | ORAL_TABLET | Freq: Four times a day (QID) | ORAL | 0 refills | Status: DC

## 2023-12-03 MED ORDER — NIFEDIPINE ER 30 MG PO TB24: 30.0000 mg | ORAL_TABLET | Freq: Every day | ORAL | 2 refills | Status: DC

## 2023-12-03 NOTE — Clinical Social Work Maternal (Signed)
 CLINICAL SOCIAL WORK MATERNAL/CHILD NOTE  Patient Details  Name: Sandy Burns MRN: [id number] Date of Birth: Feb 16, 2001  Date:  12/03/2023  Clinical Social Worker Initiating Note:  Siarra Gilkerson Date/Time: Initiated:  12/03/23/      Child's Name:  Hester Mates   Biological Parents:      Need for Interpreter:  None   Reason for Referral:  Other (Comment) (eval resource needs)   Address:  [address] [place] [address]    Phone number:  [phone] (home)     Additional phone number:   Household Members/Support Persons (HM/SP):   Household Member/Support Person 1, Household Member/Support Person 2   HM/SP Name Relationship DOB or Age  HM/SP -1 Shawn Mother unknown  HM/SP -2 [name] son 3 years  HM/SP -3        HM/SP -4        HM/SP -5        HM/SP -6        HM/SP -7        HM/SP -8          Natural Supports (not living in the home):  Immediate Family, Extended Family   Professional Supports:     Employment:     Type of Work:     Education:      Homebound arranged:    Surveyor, quantity Resources:  Medicaid   Other Resources:  [employer]   Cultural/Religious Considerations Which May Impact Care:    Strengths:  Ability to meet basic needs  , Home prepared for child  , Psychotropic Medications, Pediatrician chosen   Psychotropic Medications:  Zoloft      Pediatrician:    Merchant navy officer List:   [facility]    [facility]  [facility]      Pediatrician Fax Number:    Risk Factors/Current Problems:  None   Cognitive State:  Able to Concentrate  , Alert     Mood/Affect:  Calm  , Happy     CSW Assessment:  CSW received a consult for MOB receiving a car seat from [facility].  CSW spoke with RN prior to checking in with MOB. Per RN, there are no additional needs/concerns.  MOB reported she is feeling good post delivery. MOB was alert and  appropriate during assessment.   Confirmed contact information for MOB. MOB and Baby will be living with her Mother and 82 year old son at discharge.   MOB reported she receives [facility] and was reminded to inform her Workers of [employer] birth. MOB plans to use [facility] for [facility]. MOB reported she has all items needed for Baby. MOB reported she has reliable transportation for herself and Baby- MOB drives but her Mother can also provide transportation. MOB denied resource needs at this time.   MOB reported she has a history of depression in high school, but felt she "outgrew it." MOB stated she did have some depression symptoms late in her pregnancy and spoke with her Midwife about it who prescribed Zoloft. MOB stated the Zoloft is effective. MOB reported she has a good support system and is coping well emotionally at this time. MOB denied SI, HI, or DV. MOB denied the need for additional mental health support resources at this time, reported she is aware of resources if needed.  CSW provided education on PPD and SIDS. MOB verbalized understanding.  CSW ecouraged MOB to reach out to her Provider with any questions or needs for support or resources, even after discharge.   MOB denied any needs or questions at this time. CSW encouraged MOB to reach out if any arise prior to discharge.   Please re consult CSW if any additional needs or concerns arise.   CSW Plan/Description:  No Further Intervention Required/No Barriers to Discharge    Kacelyn Rowzee E Dimitrius Steedman, LCSW 12/03/2023, 9:24 AM

## 2023-12-04 LAB — SURGICAL PATHOLOGY

## 2023-12-05 ENCOUNTER — Ambulatory Visit: Payer: Medicaid Other | Admitting: Dermatology

## 2023-12-07 ENCOUNTER — Encounter: Payer: Medicaid Other | Admitting: Obstetrics

## 2023-12-07 ENCOUNTER — Encounter: Payer: Self-pay | Admitting: Licensed Practical Nurse

## 2023-12-07 ENCOUNTER — Encounter: Payer: Self-pay | Admitting: Obstetrics and Gynecology

## 2023-12-07 NOTE — Patient Instructions (Signed)
 High Blood Pressure During Pregnancy High blood pressure, or hypertension, is when the blood in your body moves with so much force that it could affect your health. It can cause problems for you and your baby. Three types of high blood pressure that can happen during pregnancy. They are: Chronic high blood pressure. This is when you've had high blood pressure for a while, even before getting pregnant. It doesn't go away after your baby is born. Gestational high blood pressure. This type starts after you're [redacted] weeks pregnant and usually goes away once your baby is born. Postpartum high blood pressure. This type is most common within 1 to 2 days after delivery, but it can also happen later -- even up to 12 weeks or more after pregnancy. It can be: High blood pressure that you had before your baby was born and continues after delivery. High blood pressure that starts after you've given birth. If your blood pressure gets really high, it's an emergency. You need to be treated right away. How does high blood pressure affect me? If you had blood pressure problems during pregnancy, you're more likely to get this condition after giving birth. High blood pressure can even happen 12 weeks or more after your baby is born. You may also: Get it when you are pregnant next time. Get it later in life. In some cases, this condition can cause serious problems, such as: Heart problems, such as stroke or heart attack. Injury to kidneys, lungs, or liver. Pre-eclampsia. HELLP syndrome. Seizures. Problems with the placenta. How does high blood pressure affect my baby? Your baby may: Be born early. Have a low birth weight. Have problems during labor. This may mean a C-section could be needed quickly. What are the risks for high blood pressure during pregnancy? You're more likely to get high blood pressure during pregnancy if: You had high blood pressure during a past pregnancy. You're overweight. You're 35 years  or older. You're pregnant for the first time. You're pregnant with more than one baby. You used a fertility method, such as IVF, to get pregnant. You have other problems, such as diabetes, kidney disease, or lupus. What can I do to lower my risk?  Keep a healthy weight. Eat a healthy diet. If you have long-term (chronic) conditions, have them treated before you become pregnant. How is this condition treated? Treatment depends on the type of high blood pressure you have and how serious it is. If you have high blood pressure, your health care provider may give you medicine to treat it and also to lessen risks to you and the baby. If you have very bad high blood pressure, you may need to stay in the hospital for treatment. If your condition gets worse, your baby may need to be born early. If you were taking medicine for your blood pressure before you got pregnant, talk with your provider. You may need to change the medicine during pregnancy if it is not safe for your baby. Follow these instructions at home: Eating and drinking Drink more fluids as told. Avoid caffeine. Lifestyle Do not use alcohol or drugs. Do not smoke, vape, or use nicotine or tobacco. Avoid stress as much as you can. Rest and get plenty of sleep. Get regular exercise. This can help to lower your blood pressure. Ask your health care provider what kinds of exercise are safe for you. General instructions Take your medicines only as told. Keep all follow-up visits. Your provider will check your blood pressure and  make sure that you and your baby are healthy. Contact a health care provider if: Your baby is not moving as much as usual. You feel very tired. You feel faint or dizzy. You throw up, or feel like you may throw up. You have cramping in your belly or have pain in your hips or lower back. You have spotting or bleeding, or you leak fluid from your vagina. Get help right away if: You have chest pain or trouble  breathing. You faint, have a seizure, or cannot think clearly. You have symptoms of serious problems, such as: A headache that doesn't go away when you take medicine. Very bad and sudden swelling of your face, hands, legs, or feet. Vision problems, such as: Seeing spots. Blurry vision. Being sensitive to light. These symptoms may be an emergency. Call 911 right away. Do not wait to see if the symptoms will go away. Do not drive yourself to the hospital. This information is not intended to replace advice given to you by your health care provider. Make sure you discuss any questions you have with your health care provider. Document Revised: 08/08/2023 Document Reviewed: 03/21/2023 Elsevier Patient Education  2024 [employer].

## 2023-12-08 ENCOUNTER — Encounter: Payer: Self-pay | Admitting: Licensed Practical Nurse

## 2023-12-08 ENCOUNTER — Ambulatory Visit: Payer: Medicaid Other

## 2023-12-08 ENCOUNTER — Encounter: Payer: Self-pay | Admitting: Obstetrics and Gynecology

## 2023-12-08 ENCOUNTER — Other Ambulatory Visit: Payer: Self-pay

## 2023-12-08 VITALS — BP 143/92 | HR 81 | Resp 16 | Ht 67.0 in | Wt 225.1 lb

## 2023-12-08 DIAGNOSIS — Z0130 Encounter for examination of blood pressure without abnormal findings: Secondary | ICD-10-CM

## 2023-12-08 NOTE — Progress Notes (Signed)
    NURSE VISIT NOTE  Subjective:    Patient ID: Seda Kronberg, female    DOB: 11-Nov-2000, 23 y.o.   MRN: [id number]  HPI  Patient is a 23 y.o. [id number] female who presents for BP check per order from Tresea Mall, CNM.   Patient reports  non compliance with prescribed BP medications: Nefidipine 30 mg daily Last dose of BP medication: 12/06/2023  Patient complains of dizziness and weakness when taking Medication.  BP Readings from Last 3 Encounters:  12/03/23 (!) 137/98  11/30/23 119/82  11/26/23 120/85   Pulse Readings from Last 3 Encounters:  12/03/23 66  11/30/23 83  11/26/23 92    Objective:    BP (!) 143/92   Pulse 81   Resp 16   Ht 5\' 7"  (1.702 m)   Wt 225 lb 1.6 oz (102.1 kg)   LMP 03/19/2023 (Exact Date)   Breastfeeding Yes   BMI 35.26 kg/m   Assessment:   1. Blood pressure check      Plan:   Per Tresea Mall, CNM:  Treatment: Change therapy from Nifidipine 30 mg to Labetalol 100 mg twice daily per Hartley Barefoot, CNM Continue to monitor blood pressure at home. Report any reading >140/90 or with any associated symptoms. Return to clinic as scheduled. Follow up in 1 week for Nurse Visit: BP Check   Patient verbalized understanding of instructions.     Santiago Bumpers, CMA [facility]

## 2023-12-11 ENCOUNTER — Other Ambulatory Visit: Payer: Self-pay | Admitting: Licensed Practical Nurse

## 2023-12-11 DIAGNOSIS — O133 Gestational [pregnancy-induced] hypertension without significant proteinuria, third trimester: Secondary | ICD-10-CM

## 2023-12-11 MED ORDER — LABETALOL HCL 100 MG PO TABS: 100.0000 mg | ORAL_TABLET | Freq: Two times a day (BID) | ORAL | 3 refills | Status: AC

## 2023-12-11 NOTE — Progress Notes (Signed)
 Pt with GTHN medication switched to Labetalol, pt sent mychart message stating she has not received the new medication. Prescription sent. Carie Caddy, CNM   [facility]  12/11/23  8:28 AM

## 2023-12-12 ENCOUNTER — Ambulatory Visit: Admitting: Nurse Practitioner

## 2023-12-12 ENCOUNTER — Encounter: Payer: Self-pay | Admitting: Nurse Practitioner

## 2023-12-12 VITALS — BP 120/70 | HR 91 | Temp 98.3°F | Resp 16 | Ht 67.0 in | Wt 218.2 lb

## 2023-12-12 DIAGNOSIS — F320 Major depressive disorder, single episode, mild: Secondary | ICD-10-CM

## 2023-12-12 DIAGNOSIS — R2241 Localized swelling, mass and lump, right lower limb: Secondary | ICD-10-CM | POA: Diagnosis not present

## 2023-12-12 DIAGNOSIS — R7303 Prediabetes: Secondary | ICD-10-CM | POA: Diagnosis not present

## 2023-12-12 DIAGNOSIS — O165 Unspecified maternal hypertension, complicating the puerperium: Secondary | ICD-10-CM | POA: Diagnosis not present

## 2023-12-12 NOTE — Progress Notes (Signed)
 [facility] [address] [place], [place] [address]  Internal MEDICINE  Office Visit Note  Patient Name: Sandy Burns  [id number]  [id number]  Date of Service: 12/12/2023  Chief Complaint  Patient presents with   Acute Visit    Lump on leg.     HPI Sandy Burns presents for a follow-up visit for lump on right leg Lump on lower right leg just below knee on the medial side.  Recent childbirth, baby girl is 68 weeks old, patient has help at home    Edinburgh Postnatal Depression Scale - 12/12/23 1605       Edinburgh Postnatal Depression Scale:  In the Past 7 Days   I have been able to laugh and see the funny side of things. 0    I have looked forward with enjoyment to things. 0    I have blamed myself unnecessarily when things went wrong. 1    I have been anxious or worried for no good reason. 0    I have felt scared or panicky for no good reason. 0    Things have been getting on top of me. 1    I have been so unhappy that I have had difficulty sleeping. 0    I have felt sad or miserable. 0    I have been so unhappy that I have been crying. 0    The thought of harming myself has occurred to me. 0    Edinburgh Postnatal Depression Scale Total 2               Current Medication: Outpatient Encounter Medications as of 12/12/2023  Medication Sig   cetirizine (ZYRTEC) 10 MG tablet Take 10 mg by mouth daily as needed.   EPIPEN 2-PAK 0.3 MG/0.3ML SOAJ injection Inject 0.3 mg into the muscle as needed for anaphylaxis.   fluticasone (FLONASE) 50 MCG/ACT nasal spray SHAKE LIQUID AND USE 1 SPRAY IN EACH NOSTRIL DAILY   fluticasone-salmeterol (ADVAIR DISKUS) 100-50 MCG/ACT AEPB INHALE 1 PUFF INTO THE LUNGS TWICE DAILY   ibuprofen (ADVIL) 600 MG tablet Take 1 tablet (600 mg total) by mouth every 6 (six) hours.   labetalol (NORMODYNE) 100 MG tablet Take 1 tablet (100 mg total) by mouth 2 (two) times daily.   prenatal vitamin w/FE, FA (NATACHEW) 29-1 MG CHEW chewable tablet  Chew 2 tablets by mouth daily at 12 noon.   sertraline (ZOLOFT) 25 MG tablet Take 1 tablet (25 mg total) by mouth daily.   VENTOLIN HFA 108 (90 Base) MCG/ACT inhaler INHALE 2 PUFFS INTO THE LUNGS EVERY 4 HOURS AS NEEDED FOR WHEEZING OR SHORTNESS OF BREATH   [DISCONTINUED] NIFEdipine (ADALAT CC) 30 MG 24 hr tablet Take 1 tablet (30 mg total) by mouth daily.   No facility-administered encounter medications on file as of 12/12/2023.    Surgical History: Past Surgical History:  Procedure Laterality Date   FOOT SURGERY  2015   REPAIR VAGINAL CUFF N/A 09/06/2020   Procedure: REPAIR VAGINAL CUFF;  Surgeon: Teresa Fender, MD;  Location: [facility] ORS;  Service: Gynecology;  Laterality: N/A;   TOOTH EXTRACTION  2015   WISDOM TOOTH EXTRACTION     four; age 23    Medical History: Past Medical History:  Diagnosis Date   Anemia    Asthma    Delay 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/07/2020   Eczema    Environmental allergies    History of blood transfusion    after giving birth 09/06/2020   Vaginal vault hematoma 09/07/2020  Family History: Family History  Problem Relation Age of Onset   Rheum arthritis Mother    Hypertension Mother    Lupus Mother    Schizophrenia Father    Healthy Sister    Healthy Sister    Healthy Brother    Healthy Brother    Asthma Maternal Grandmother    Hypertension Maternal Grandmother    Glaucoma Maternal Grandmother    Carpal tunnel syndrome Maternal Grandmother    Healthy Maternal Grandfather     Social History   Socioeconomic History   Marital status: Single    Spouse name: Not on file   Number of children: 1   Years of education: 14   Highest education level: Not on file  Occupational History   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  Tobacco Use   Smoking status: Never   Smokeless tobacco: Never  Vaping Use   Vaping status: Never Used  Substance and Sexual Activity   Alcohol use: Not Currently    Comment: special occ and holidays   Drug use: Never    Sexual activity: Yes    Partners: Male    Birth control/protection: None  Other Topics Concern   Not on file  Social History Narrative   Not on file   Social Drivers of Health   Financial Resource Strain: Medium Risk (05/05/2023)   Overall Financial Resource Strain (CARDIA)    Difficulty of Paying Living Expenses: Somewhat hard  Food Insecurity: No Food Insecurity (11/30/2023)   Hunger Vital Sign    Worried About Running Out of Food in the Last Year: Never true    Ran Out of Food in the Last Year: Never true  Transportation Needs: No Transportation Needs (11/30/2023)   PRAPARE - Administrator, Civil Service (Medical): No    Lack of Transportation (Non-Medical): No  Physical Activity: Insufficiently Active (05/05/2023)   Exercise Vital Sign    Days of Exercise per Week: 1 day    Minutes of Exercise per Session: 40 min  Stress: No Stress Concern Present (05/05/2023)   [employer] of Occupational Health - Occupational Stress Questionnaire    Feeling of Stress : Not at all  Social Connections: Unknown (05/05/2023)   Social Connection and Isolation Panel [NHANES]    Frequency of Communication with Friends and Family: Once a week    Frequency of Social Gatherings with Friends and Family: Three times a week    Attends Religious Services: Never    Active Member of Clubs or Organizations: No    Attends Banker Meetings: Never    Marital Status: Not on file  Intimate Partner Violence: Not At Risk (11/30/2023)   Humiliation, Afraid, Rape, and Kick questionnaire    Fear of Current or Ex-Partner: No    Emotionally Abused: No    Physically Abused: No    Sexually Abused: No      Review of Systems  Constitutional:  Positive for fatigue.  HENT: Negative.    Respiratory: Negative.  Negative for chest tightness, shortness of breath and wheezing.   Cardiovascular:  Negative for chest pain and palpitations.  Gastrointestinal: Negative.   Musculoskeletal:  Negative.   Skin:        Lump on lower right leg  Neurological: Negative.  Negative for headaches.  Psychiatric/Behavioral:  Positive for behavioral problems. Negative for self-injury and suicidal ideas.     Vital Signs: BP 120/70 Comment: 140/90  Pulse 91   Temp 98.3 F (36.8 C)   Resp 16  Ht 5\' 7"  (1.702 m)   Wt 218 lb 3.2 oz (99 kg)   LMP 03/19/2023 (Exact Date)   SpO2 93%   BMI 34.17 kg/m    Physical Exam Vitals reviewed.  Constitutional:      General: She is not in acute distress.    Appearance: Normal appearance. She is obese. She is not ill-appearing.  HENT:     Head: Normocephalic and atraumatic.  Eyes:     Pupils: Pupils are equal, round, and reactive to light.  Cardiovascular:     Rate and Rhythm: Normal rate and regular rhythm.  Pulmonary:     Effort: Pulmonary effort is normal. No respiratory distress.  Skin:    General: Skin is warm and dry.     Comments: Small lump on the medial aspect of the lower right leg below the knee.   Neurological:     Mental Status: She is alert and oriented to person, place, and time.  Psychiatric:        Mood and Affect: Mood normal.        Behavior: Behavior normal.        Assessment/Plan: 1. Localized swelling, mass, or lump of lower extremity, right (Primary) Ultrasound ordered, will follow up with results  - US  SOFT TISSUE LOWER EXTREMITY LIMITED RIGHT (NON-VASCULAR); Future  2. Prediabetes A1c is stable at 5.9 as of September 2024.   3. Postpartum hypertension Elevated BP after delivery, was started on labetalol by OBGYN  4. Depression, major, single episode, mild (HCC) Taking sertraline, continue as prescribed.    General Counseling: Gracelee verbalizes understanding of the findings of todays visit and agrees with plan of treatment. I have discussed any further diagnostic evaluation that may be needed or ordered today. We also reviewed her medications today. she has been encouraged to call the office with  any questions or concerns that should arise related to todays visit.    Orders Placed This Encounter  Procedures   US  SOFT TISSUE LOWER EXTREMITY LIMITED RIGHT (NON-VASCULAR)    No orders of the defined types were placed in this encounter.   Return for F/U, Anjelique Makar PCP to discuss ultrasoun results .   Total time spent:30 Minutes Time spent includes review of chart, medications, test results, and follow up plan with the patient.   [place] Controlled Substance Database was reviewed by me.  This patient was seen by Laurence Pons, FNP-C in collaboration with Dr. Verneta Gone as a part of collaborative care agreement.   Lovie Agresta R. Bobbi Burow, MSN, FNP-C Internal medicine

## 2023-12-13 ENCOUNTER — Telehealth: Payer: Self-pay | Admitting: Nurse Practitioner

## 2023-12-13 NOTE — Telephone Encounter (Signed)
 Notified patient of U/S appointment date, arrival time, location-[name]

## 2023-12-14 ENCOUNTER — Encounter: Payer: Medicaid Other | Admitting: Certified Nurse Midwife

## 2023-12-14 NOTE — Progress Notes (Cosign Needed Addendum)
    NURSE VISIT NOTE  Subjective:    Patient ID: Sandy Burns, female    DOB: 11-May-2001, 23 y.o.   MRN: [id number]  HPI  Patient is a 23 y.o. [id number] female who presents for BP check per order from Hartley Barefoot, CNM.   Patient reports compliance with prescribed BP medications: no Labetalol 100 mgtwice daily Last dose of BP medication:  THIS MORNING at 8AM  BP Readings from Last 3 Encounters:  12/15/23 114/83  12/12/23 120/70  12/08/23 (!) 143/92   Pulse Readings from Last 3 Encounters:  12/15/23 91  12/12/23 91  12/08/23 81    Objective:    BP 114/83   Pulse 91   Ht 5\' 7"  (1.702 m)   Wt 221 lb (100.2 kg)   LMP 03/19/2023 (Exact Date)   Breastfeeding Yes   BMI 34.61 kg/m   Assessment:   1. BP check      Plan:   Per Hartley Barefoot, CNM:  Continue current treatment regimen.  Patient verbalized understanding of instructions.   Cornelius Moras, CMA

## 2023-12-15 ENCOUNTER — Ambulatory Visit: Payer: Medicaid Other

## 2023-12-15 ENCOUNTER — Ambulatory Visit

## 2023-12-15 VITALS — BP 114/83 | HR 91 | Ht 67.0 in | Wt 221.0 lb

## 2023-12-15 DIAGNOSIS — Z0130 Encounter for examination of blood pressure without abnormal findings: Secondary | ICD-10-CM

## 2023-12-16 ENCOUNTER — Encounter: Payer: Self-pay | Admitting: Nurse Practitioner

## 2023-12-18 ENCOUNTER — Encounter: Payer: Self-pay | Admitting: Obstetrics and Gynecology

## 2023-12-18 DIAGNOSIS — J069 Acute upper respiratory infection, unspecified: Secondary | ICD-10-CM | POA: Diagnosis not present

## 2023-12-20 ENCOUNTER — Ambulatory Visit

## 2023-12-21 ENCOUNTER — Other Ambulatory Visit: Payer: Self-pay | Admitting: Nurse Practitioner

## 2023-12-21 ENCOUNTER — Other Ambulatory Visit: Payer: Self-pay | Admitting: Dermatology

## 2023-12-21 ENCOUNTER — Encounter: Payer: Medicaid Other | Admitting: Certified Nurse Midwife

## 2023-12-21 DIAGNOSIS — J4520 Mild intermittent asthma, uncomplicated: Secondary | ICD-10-CM

## 2023-12-21 DIAGNOSIS — L2081 Atopic neurodermatitis: Secondary | ICD-10-CM

## 2023-12-21 DIAGNOSIS — E559 Vitamin D deficiency, unspecified: Secondary | ICD-10-CM

## 2023-12-22 NOTE — Telephone Encounter (Signed)
 Please refused Singulair and send other

## 2023-12-25 ENCOUNTER — Ambulatory Visit
Admission: RE | Admit: 2023-12-25 | Discharge: 2023-12-25 | Disposition: A | Discharge status: Home / Self Care | Source: Ambulatory Visit | Attending: Nurse Practitioner | Admitting: Nurse Practitioner

## 2023-12-25 DIAGNOSIS — R2241 Localized swelling, mass and lump, right lower limb: Secondary | ICD-10-CM

## 2023-12-26 ENCOUNTER — Telehealth: Payer: Self-pay | Admitting: Nurse Practitioner

## 2023-12-26 NOTE — Telephone Encounter (Signed)
 Sent message to patient notifying her work note is completed and ready to p/u @ front desk-[name]

## 2023-12-27 ENCOUNTER — Encounter: Payer: Self-pay | Admitting: Obstetrics and Gynecology

## 2023-12-27 ENCOUNTER — Ambulatory Visit (INDEPENDENT_AMBULATORY_CARE_PROVIDER_SITE_OTHER): Admitting: Obstetrics and Gynecology

## 2023-12-27 DIAGNOSIS — Z0130 Encounter for examination of blood pressure without abnormal findings: Secondary | ICD-10-CM | POA: Diagnosis not present

## 2023-12-27 DIAGNOSIS — O133 Gestational [pregnancy-induced] hypertension without significant proteinuria, third trimester: Secondary | ICD-10-CM

## 2023-12-27 NOTE — Progress Notes (Signed)
 HPI:      [name] is a 23 y.o. [id number] who LMP was Patient's last menstrual period was 03/19/2023 (exact date).  Subjective:   She presents today 3 weeks postpartum.  She reports that she is doing well.  She has been taking her labetalol.  She has not been taking her Zoloft for the last 2 weeks.  She reports no episodes of depression or issues with depression.  She desires a return to work as soon as possible.  She is breast-feeding and bottlefeeding.  She would like to meet with lactation consultation for further assistance with breast-feeding.    Hx: The following portions of the patient's history were reviewed and updated as appropriate:             She  has a past medical history of Anemia, Asthma, Delay postpartum hemorrhage (09/07/2020), Eczema, Environmental allergies, History of blood transfusion, and Vaginal vault hematoma (09/07/2020). She does not have any pertinent problems on file. She  has a past surgical history that includes Foot surgery (2015); Tooth extraction (2015); Repair vaginal cuff (N/A, 09/06/2020); and Wisdom tooth extraction. Her family history includes Asthma in her maternal grandmother; Carpal tunnel syndrome in her maternal grandmother; Glaucoma in her maternal grandmother; Healthy in her brother, brother, maternal grandfather, sister, and sister; Hypertension in her maternal grandmother and mother; Lupus in her mother; Rheum arthritis in her mother; Schizophrenia in her father. She  reports that she has never smoked. She has never used smokeless tobacco. She reports that she does not currently use alcohol. She reports that she does not use drugs. She has a current medication list which includes the following prescription(s): advair diskus, albuterol, cetirizine, epipen 2-pak, fluticasone, ibuprofen, labetalol, montelukast, prenatal vitamin w/fe, fa, opzelura, sertraline, and vitamin d (ergocalciferol). She is allergic to other, cats claw (uncaria tomentosa),  dust mite extract, molds & smuts, and tree extract.       Review of Systems:  Review of Systems  Constitutional: Denied constitutional symptoms, night sweats, recent illness, fatigue, fever, insomnia and weight loss.  Eyes: Denied eye symptoms, eye pain, photophobia, vision change and visual disturbance.  Ears/Nose/Throat/Neck: Denied ear, nose, throat or neck symptoms, hearing loss, nasal discharge, sinus congestion and sore throat.  Cardiovascular: Denied cardiovascular symptoms, arrhythmia, chest pain/pressure, edema, exercise intolerance, orthopnea and palpitations.  Respiratory: Denied pulmonary symptoms, asthma, pleuritic pain, productive sputum, cough, dyspnea and wheezing.  Gastrointestinal: Denied, gastro-esophageal reflux, melena, nausea and vomiting.  Genitourinary: Denied genitourinary symptoms including symptomatic vaginal discharge, pelvic relaxation issues, and urinary complaints.  Musculoskeletal: Denied musculoskeletal symptoms, stiffness, swelling, muscle weakness and myalgia.  Dermatologic: Denied dermatology symptoms, rash and scar.  Neurologic: Denied neurology symptoms, dizziness, headache, neck pain and syncope.  Psychiatric: Denied psychiatric symptoms, anxiety and depression.  Endocrine: Denied endocrine symptoms including hot flashes and night sweats.   Meds:   Current Outpatient Medications on File Prior to Visit  Medication Sig Dispense Refill   ADVAIR DISKUS 100-50 MCG/ACT AEPB INHALE 1 PUFF INTO THE LUNGS TWICE DAILY 60 each 11   albuterol (VENTOLIN HFA) 108 (90 Base) MCG/ACT inhaler INHALE 2 PUFFS INTO THE LUNGS EVERY 4 HOURS AS NEEDED FOR WHEEZING OR SHORTNESS OF BREATH 18 g 3   cetirizine (ZYRTEC) 10 MG tablet Take 10 mg by mouth daily as needed.     EPIPEN 2-PAK 0.3 MG/0.3ML SOAJ injection Inject 0.3 mg into the muscle as needed for anaphylaxis. 1 each 0   fluticasone (FLONASE) 50 MCG/ACT nasal spray SHAKE LIQUID AND  USE 1 SPRAY IN EACH NOSTRIL DAILY 16 g 5    ibuprofen (ADVIL) 600 MG tablet Take 1 tablet (600 mg total) by mouth every 6 (six) hours. 30 tablet 0   labetalol (NORMODYNE) 100 MG tablet Take 1 tablet (100 mg total) by mouth 2 (two) times daily. 60 tablet 3   montelukast (SINGULAIR) 10 MG tablet TAKE 1 TABLET(10 MG) BY MOUTH AT BEDTIME 90 tablet 3   prenatal vitamin w/FE, FA (NATACHEW) 29-1 MG CHEW chewable tablet Chew 2 tablets by mouth daily at 12 noon. 30 tablet 9   Ruxolitinib Phosphate (OPZELURA) 1.5 % CREA APPLY TOPICALLY TO LEGS ONCE DAILY 60 g 2   sertraline (ZOLOFT) 25 MG tablet Take 1 tablet (25 mg total) by mouth daily. 30 tablet 3   Vitamin D, Ergocalciferol, (DRISDOL) 1.25 MG (50000 UNIT) CAPS capsule TAKE 1 CAPSULE BY MOUTH EVERY 7 DAYS 12 capsule 1   No current facility-administered medications on file prior to visit.      Objective:     Vitals:   12/27/23 1333  BP: 121/85  Pulse: 89   Filed Weights   12/27/23 1333  Weight: 222 lb 9.6 oz (101 kg)                        Assessment:    [id number] Patient Active Problem List   Diagnosis Date Noted   Encounter for induction of labor 12/03/2023   Postpartum care following vaginal delivery 12/03/2023   Encounter for care or examination of lactating mother 12/03/2023   Hx of pre-eclampsia in prior pregnancy, currently pregnant 11/26/2023   Hx of postpartum hemorrhage, currently pregnant 11/26/2023   Supervision of other normal pregnancy, antepartum 05/05/2023   Obesity in pregnancy 09/07/2020     1. Postpartum care following vaginal delivery   2. BP check   3. Gestational hypertension, third trimester   4. Lactating mother     Doing very well postpartum.   Plan:            1.  Continue labetalol till 6-week checkup.  2.  We have chosen to not restart Zoloft at this time.  We will check in again at 6 weeks and see if she needs it.  3.  Lactation consultation  4.  Return to work note Orders Orders Placed This Encounter  Procedures   Ambulatory  referral to Lactation    No orders of the defined types were placed in this encounter.     F/U  Return in about 3 weeks (around 01/17/2024).  Elonda Husky, M.D. 12/27/2023 1:52 PM

## 2023-12-27 NOTE — Patient Instructions (Signed)
  Tips to Increase Milk Supply: Increase PO fluids  , Encourage baby to breastfeed every 2-3 hours   , Offer both breasts at each feeding  , Allow baby to stay at the first breast if he/she is actively suckling and swallowing. Offer second breast when baby slows down or stops.  At next feeding start with the last breast from the previous feeding , Ensure proper latching.  Refer to [facility] outpatient lactation [phone].  , Limit use of pacifier so all sucking is at the breast.  , and Pump after breastfeeding any time infant needs supplementation to promote milk supply

## 2023-12-27 NOTE — Progress Notes (Signed)
 Patient presents today for 3 week postpartum follow-up. Patient had a vaginal delivery on 11/30/22. Continuing to take Labetalol 100 mg bid, feeling well on medication. She reports baby is bottle feeding, she wants to try to breast feed and pump but is having trouble; lactation referral placed. She states she would like an IUD for birth control. EPDS score of 4. She states she would like to return to work on 01/01/24 if able.

## 2024-01-04 DIAGNOSIS — R0602 Shortness of breath: Secondary | ICD-10-CM | POA: Diagnosis not present

## 2024-01-04 DIAGNOSIS — E039 Hypothyroidism, unspecified: Secondary | ICD-10-CM | POA: Diagnosis not present

## 2024-01-04 DIAGNOSIS — R42 Dizziness and giddiness: Secondary | ICD-10-CM | POA: Diagnosis not present

## 2024-01-07 DIAGNOSIS — J069 Acute upper respiratory infection, unspecified: Secondary | ICD-10-CM | POA: Diagnosis not present

## 2024-01-09 DIAGNOSIS — J301 Allergic rhinitis due to pollen: Secondary | ICD-10-CM | POA: Diagnosis not present

## 2024-01-10 ENCOUNTER — Encounter: Payer: Self-pay | Admitting: Nurse Practitioner

## 2024-01-10 DIAGNOSIS — Z7251 High risk heterosexual behavior: Secondary | ICD-10-CM | POA: Diagnosis not present

## 2024-01-10 NOTE — Progress Notes (Signed)
 Will review results at upcoming office visit

## 2024-01-15 ENCOUNTER — Ambulatory Visit

## 2024-01-15 ENCOUNTER — Encounter: Payer: Self-pay | Admitting: Obstetrics and Gynecology

## 2024-01-17 ENCOUNTER — Encounter: Payer: Self-pay | Admitting: Nurse Practitioner

## 2024-01-17 ENCOUNTER — Telehealth: Payer: Self-pay | Admitting: Internal Medicine

## 2024-01-17 ENCOUNTER — Ambulatory Visit: Admitting: Nurse Practitioner

## 2024-01-17 VITALS — BP 118/86 | HR 87 | Temp 98.1°F | Resp 16 | Ht 67.0 in | Wt 238.8 lb

## 2024-01-17 DIAGNOSIS — R2241 Localized swelling, mass and lump, right lower limb: Secondary | ICD-10-CM | POA: Diagnosis not present

## 2024-01-17 DIAGNOSIS — G4719 Other hypersomnia: Secondary | ICD-10-CM | POA: Diagnosis not present

## 2024-01-17 DIAGNOSIS — J4520 Mild intermittent asthma, uncomplicated: Secondary | ICD-10-CM | POA: Diagnosis not present

## 2024-01-17 DIAGNOSIS — Z6837 Body mass index (BMI) 37.0-37.9, adult: Secondary | ICD-10-CM

## 2024-01-17 DIAGNOSIS — E66812 Obesity, class 2: Secondary | ICD-10-CM | POA: Diagnosis not present

## 2024-01-17 NOTE — Telephone Encounter (Signed)
 SS order emailed to [facility] w/  [name]

## 2024-01-17 NOTE — Progress Notes (Signed)
 [facility] [address] [place], [place] [address]  Internal MEDICINE  Office Visit Note  Patient Name: Sandy Burns  [id number]  [id number]  Date of Service: 01/17/2024  Chief Complaint  Patient presents with   Follow-up    Review u/s     HPI Sandy Burns presents for a follow-up visit for ultrasound results, asthma, hypersomnia.  Ultrasound results reviewed -- subcutaneous nodules of the right lower leg. Not painful or tender. They are not vascular and not suspicious per [place] report. Consider biopsy if clinically indicated.  Breathing is not as good, increased SOB and increased dry cough. Reports chest tightness and some intermittent wheezing. Has PFT ordered and sleep study already ordered, needs to be scheduled.  Hypersomnia with headaches and fatigue -- sleep study was ordered by Dr. Freda Munro.     Current Medication: Outpatient Encounter Medications as of 01/17/2024  Medication Sig   ADVAIR DISKUS 100-50 MCG/ACT AEPB INHALE 1 PUFF INTO THE LUNGS TWICE DAILY   albuterol (VENTOLIN HFA) 108 (90 Base) MCG/ACT inhaler INHALE 2 PUFFS INTO THE LUNGS EVERY 4 HOURS AS NEEDED FOR WHEEZING OR SHORTNESS OF BREATH   cetirizine (ZYRTEC) 10 MG tablet Take 10 mg by mouth daily as needed.   EPIPEN 2-PAK 0.3 MG/0.3ML SOAJ injection Inject 0.3 mg into the muscle as needed for anaphylaxis.   fluticasone (FLONASE) 50 MCG/ACT nasal spray SHAKE LIQUID AND USE 1 SPRAY IN EACH NOSTRIL DAILY   ibuprofen (ADVIL) 600 MG tablet Take 1 tablet (600 mg total) by mouth every 6 (six) hours.   labetalol (NORMODYNE) 100 MG tablet Take 1 tablet (100 mg total) by mouth 2 (two) times daily.   montelukast (SINGULAIR) 10 MG tablet TAKE 1 TABLET(10 MG) BY MOUTH AT BEDTIME   prenatal vitamin w/FE, FA (NATACHEW) 29-1 MG CHEW chewable tablet Chew 2 tablets by mouth daily at 12 noon.   Ruxolitinib Phosphate (OPZELURA) 1.5 % CREA APPLY TOPICALLY TO LEGS ONCE DAILY   sertraline (ZOLOFT) 25 MG tablet Take 1 tablet  (25 mg total) by mouth daily.   Vitamin D, Ergocalciferol, (DRISDOL) 1.25 MG (50000 UNIT) CAPS capsule TAKE 1 CAPSULE BY MOUTH EVERY 7 DAYS   No facility-administered encounter medications on file as of 01/17/2024.    Surgical History: Past Surgical History:  Procedure Laterality Date   FOOT SURGERY  2015   REPAIR VAGINAL CUFF N/A 09/06/2020   Procedure: REPAIR VAGINAL CUFF;  Surgeon: Hildred Laser, MD;  Location: [facility] ORS;  Service: Gynecology;  Laterality: N/A;   TOOTH EXTRACTION  2015   WISDOM TOOTH EXTRACTION     four; age 19    Medical History: Past Medical History:  Diagnosis Date   Anemia    Asthma    Delay 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/07/2020   Eczema    Environmental allergies    History of blood transfusion    after giving birth 09/06/2020   Vaginal vault hematoma 09/07/2020    Family History: Family History  Problem Relation Age of Onset   Rheum arthritis Mother    Hypertension Mother    Lupus Mother    Schizophrenia Father    Healthy Sister    Healthy Sister    Healthy Brother    Healthy Brother    Asthma Maternal Grandmother    Hypertension Maternal Grandmother    Glaucoma Maternal Grandmother    Carpal tunnel syndrome Maternal Grandmother    Healthy Maternal Grandfather     Social History   Socioeconomic History   Marital status: Single  Spouse name: Not on file   Number of children: 1   Years of education: 14   Highest education level: Not on file  Occupational History   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  Tobacco Use   Smoking status: Never   Smokeless tobacco: Never  Vaping Use   Vaping status: Never Used  Substance and Sexual Activity   Alcohol use: Not Currently    Comment: special occ and holidays   Drug use: Never   Sexual activity: Yes    Partners: Male    Birth control/protection: None  Other Topics Concern   Not on file  Social History Narrative   Not on file   Social Drivers of Health   Financial Resource Strain: Medium  Risk (05/05/2023)   Overall Financial Resource Strain (CARDIA)    Difficulty of Paying Living Expenses: Somewhat hard  Food Insecurity: No Food Insecurity (11/30/2023)   Hunger Vital Sign    Worried About Running Out of Food in the Last Year: Never true    Ran Out of Food in the Last Year: Never true  Transportation Needs: No Transportation Needs (11/30/2023)   PRAPARE - Administrator, Civil Service (Medical): No    Lack of Transportation (Non-Medical): No  Physical Activity: Insufficiently Active (05/05/2023)   Exercise Vital Sign    Days of Exercise per Week: 1 day    Minutes of Exercise per Session: 40 min  Stress: No Stress Concern Present (05/05/2023)   [employer] of Occupational Health - Occupational Stress Questionnaire    Feeling of Stress : Not at all  Social Connections: Unknown (05/05/2023)   Social Connection and Isolation Panel [NHANES]    Frequency of Communication with Friends and Family: Once a week    Frequency of Social Gatherings with Friends and Family: Three times a week    Attends Religious Services: Never    Active Member of Clubs or Organizations: No    Attends Banker Meetings: Never    Marital Status: Not on file  Intimate Partner Violence: Not At Risk (11/30/2023)   Humiliation, Afraid, Rape, and Kick questionnaire    Fear of Current or Ex-Partner: No    Emotionally Abused: No    Physically Abused: No    Sexually Abused: No      Review of Systems  Constitutional:  Positive for appetite change, fatigue and unexpected weight change. Negative for chills and fever.  HENT:  Positive for postnasal drip. Negative for congestion, rhinorrhea, sinus pressure, sinus pain, sneezing and sore throat.   Respiratory:  Positive for shortness of breath (intermittent). Negative for cough, chest tightness and wheezing.   Cardiovascular: Negative.  Negative for chest pain and palpitations.  Gastrointestinal: Negative.  Negative for abdominal  pain, constipation, diarrhea, nausea and vomiting.  Musculoskeletal:  Negative for arthralgias.  Neurological:  Positive for headaches.  Psychiatric/Behavioral:  Positive for behavioral problems and sleep disturbance. Negative for self-injury. The patient is nervous/anxious.     Vital Signs: BP 118/86   Pulse 87   Temp 98.1 F (36.7 C)   Resp 16   Ht 5\' 7"  (1.702 m)   Wt 238 lb 12.8 oz (108.3 kg)   SpO2 99%   BMI 37.40 kg/m    Physical Exam Vitals reviewed.  Constitutional:      General: She is not in acute distress.    Appearance: Normal appearance. She is obese. She is not ill-appearing.  HENT:     Head: Normocephalic and atraumatic.  Eyes:  Pupils: Pupils are equal, round, and reactive to light.  Cardiovascular:     Rate and Rhythm: Normal rate and regular rhythm.  Pulmonary:     Effort: Pulmonary effort is normal. No respiratory distress.  Neurological:     Mental Status: She is alert and oriented to person, place, and time.  Psychiatric:        Mood and Affect: Mood normal.        Behavior: Behavior normal.        Assessment/Plan: 1. Subcutaneous nodule of right lower leg (Primary) No intervention at this time, patient will call clinic if nodule gets larger, painful or tender,   2. Mild intermittent asthma without complication Needs PFT scheduled and a follow up to discuss results   3. Other hypersomnia Needs sleep study scheduled and a follow up to discuss results   4. Class 2 severe obesity due to excess calories with serious comorbidity and body mass index (BMI) of 37.0 to 37.9 in adult [facility]) Sleep study needs to be scheduled.   General Counseling: Teale verbalizes understanding of the findings of todays visit and agrees with plan of treatment. I have discussed any further diagnostic evaluation that may be needed or ordered today. We also reviewed her medications today. she has been encouraged to call the office with any questions or concerns that  should arise related to todays visit.    No orders of the defined types were placed in this encounter.   No orders of the defined types were placed in this encounter.   Return for need SS and PFT scheduled and need f/u visit to review results w/lauren or DSK. .   Total time spent:30 Minutes Time spent includes review of chart, medications, test results, and follow up plan with the patient.   [place] Controlled Substance Database was reviewed by me.  This patient was seen by Sallyanne Kuster, FNP-C in collaboration with Dr. Beverely Risen as a part of collaborative care agreement.   Meganne Rita R. Tedd Sias, MSN, FNP-C Internal medicine

## 2024-01-18 ENCOUNTER — Ambulatory Visit (INDEPENDENT_AMBULATORY_CARE_PROVIDER_SITE_OTHER): Admitting: Obstetrics and Gynecology

## 2024-01-18 ENCOUNTER — Encounter: Payer: Self-pay | Admitting: Obstetrics and Gynecology

## 2024-01-18 DIAGNOSIS — Z30430 Encounter for insertion of intrauterine contraceptive device: Secondary | ICD-10-CM

## 2024-01-18 DIAGNOSIS — Z0130 Encounter for examination of blood pressure without abnormal findings: Secondary | ICD-10-CM

## 2024-01-18 DIAGNOSIS — J301 Allergic rhinitis due to pollen: Secondary | ICD-10-CM | POA: Diagnosis not present

## 2024-01-18 DIAGNOSIS — O133 Gestational [pregnancy-induced] hypertension without significant proteinuria, third trimester: Secondary | ICD-10-CM

## 2024-01-18 MED ORDER — LEVONORGESTREL 20 MCG/DAY IU IUD
1.0000 | INTRAUTERINE_SYSTEM | Freq: Once | INTRAUTERINE | Status: AC
  Administered 2024-01-18: 1 via INTRAUTERINE

## 2024-01-18 NOTE — Progress Notes (Signed)
 HPI:      Sandy Burns is a 23 y.o. [id number] who LMP was Patient's last menstrual period was 01/14/2024.  Subjective:   She presents today for IUD insertion and her 6-week postpartum visit.  She reports that she is doing well.  She is bottlefeeding. 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  She is not sure that she has ever had a true outbreak.  Although, she does describe occasional spot on her thigh that gets irritated.    Hx: The following portions of the patient's history were reviewed and updated as appropriate:             She  has a past medical history of Anemia, Asthma, Delay postpartum hemorrhage (09/07/2020), Eczema, Environmental allergies, History of blood transfusion, and Vaginal vault hematoma (09/07/2020). She does not have any pertinent problems on file. She  has a past surgical history that includes Foot surgery (2015); Tooth extraction (2015); Repair vaginal cuff (N/A, 09/06/2020); and Wisdom tooth extraction. Her family history includes Asthma in her maternal grandmother; Carpal tunnel syndrome in her maternal grandmother; Glaucoma in her maternal grandmother; Healthy in her brother, brother, maternal grandfather, sister, and sister; Hypertension in her maternal grandmother and mother; Lupus in her mother; Rheum arthritis in her mother; Schizophrenia in her father. She  reports that she has never smoked. She has never used smokeless tobacco. She reports that she does not currently use alcohol. She reports that she does not use drugs. She has a current medication list which includes the following prescription(s): advair diskus, albuterol, cetirizine, epipen 2-pak, fluticasone, ibuprofen, montelukast, prenatal vitamin w/fe, fa, opzelura, sertraline, vitamin d (ergocalciferol), and labetalol, and the following Facility-Administered Medications: levonorgestrel. She is allergic to other, cats claw (uncaria tomentosa), dust  mite extract, molds & smuts, and tree extract.       Review of Systems:  Review of Systems  Constitutional: Denied constitutional symptoms, night sweats, recent illness, fatigue, fever, insomnia and weight loss.  Eyes: Denied eye symptoms, eye pain, photophobia, vision change and visual disturbance.  Ears/Nose/Throat/Neck: Denied ear, nose, throat or neck symptoms, hearing loss, nasal discharge, sinus congestion and sore throat.  Cardiovascular: Denied cardiovascular symptoms, arrhythmia, chest pain/pressure, edema, exercise intolerance, orthopnea and palpitations.  Respiratory: Denied pulmonary symptoms, asthma, pleuritic pain, productive sputum, cough, dyspnea and wheezing.  Gastrointestinal: Denied, gastro-esophageal reflux, melena, nausea and vomiting.  Genitourinary: Denied genitourinary symptoms including symptomatic vaginal discharge, pelvic relaxation issues, and urinary complaints.  Musculoskeletal: Denied musculoskeletal symptoms, stiffness, swelling, muscle weakness and myalgia.  Dermatologic: Denied dermatology symptoms, rash and scar.  Neurologic: Denied neurology symptoms, dizziness, headache, neck pain and syncope.  Psychiatric: Denied psychiatric symptoms, anxiety and depression.  Endocrine: Denied endocrine symptoms including hot flashes and night sweats.   Meds:   Current Outpatient Medications on File Prior to Visit  Medication Sig Dispense Refill   ADVAIR DISKUS 100-50 MCG/ACT AEPB INHALE 1 PUFF INTO THE LUNGS TWICE DAILY 60 each 11   albuterol (VENTOLIN HFA) 108 (90 Base) MCG/ACT inhaler INHALE 2 PUFFS INTO THE LUNGS EVERY 4 HOURS AS NEEDED FOR WHEEZING OR SHORTNESS OF BREATH 18 g 3   cetirizine (ZYRTEC) 10 MG tablet Take 10 mg by mouth daily as needed.     EPIPEN 2-PAK 0.3 MG/0.3ML SOAJ injection Inject 0.3 mg into the muscle as needed for anaphylaxis. 1 each 0   fluticasone (FLONASE) 50 MCG/ACT nasal spray SHAKE LIQUID AND USE 1 SPRAY  IN EACH NOSTRIL DAILY 16 g 5    ibuprofen (ADVIL) 600 MG tablet Take 1 tablet (600 mg total) by mouth every 6 (six) hours. 30 tablet 0   montelukast (SINGULAIR) 10 MG tablet TAKE 1 TABLET(10 MG) BY MOUTH AT BEDTIME 90 tablet 3   prenatal vitamin w/FE, FA (NATACHEW) 29-1 MG CHEW chewable tablet Chew 2 tablets by mouth daily at 12 noon. 30 tablet 9   Ruxolitinib Phosphate (OPZELURA) 1.5 % CREA APPLY TOPICALLY TO LEGS ONCE DAILY 60 g 2   sertraline (ZOLOFT) 25 MG tablet Take 1 tablet (25 mg total) by mouth daily. 30 tablet 3   Vitamin D, Ergocalciferol, (DRISDOL) 1.25 MG (50000 UNIT) CAPS capsule TAKE 1 CAPSULE BY MOUTH EVERY 7 DAYS 12 capsule 1   labetalol (NORMODYNE) 100 MG tablet Take 1 tablet (100 mg total) by mouth 2 (two) times daily. (Patient not taking: Reported on 01/18/2024) 60 tablet 3   No current facility-administered medications on file prior to visit.    Objective:     Vitals:   01/18/24 1437  BP: 122/85  Pulse: 85    Physical examination   Pelvic:   Vulva: Normal appearance.  No lesions.  Vagina: No lesions or abnormalities noted.  Support: Normal pelvic support.  Urethra No masses tenderness or scarring.  Meatus Normal size without lesions or prolapse.  Cervix: Normal appearance.  No lesions.  Anus: Normal exam.  No lesions.  Perineum: Normal exam.  No lesions.        Bimanual   Uterus: Normal size.  Non-tender.  Mobile.  AV.  Adnexae: No masses.  Non-tender to palpation.  Cul-de-sac: Negative for abnormality.   IUD Procedure Pt has read the booklet and signed the appropriate forms regarding the Mirena IUD.  All of her questions have been answered.   The cervix was cleansed with betadine solution.  After sounding the uterus and noting the position, the IUD was placed in the usual manner without problem.  The string was cut to the appropriate length.  The patient tolerated the procedure well.            NDC # = [id number]   Assessment:    [id number] Patient Active Problem List   Diagnosis  Date Noted   Encounter for induction of labor 12/03/2023   Postpartum care following vaginal delivery 12/03/2023   Encounter for care or examination of lactating mother 12/03/2023   Hx of pre-eclampsia in prior pregnancy, currently pregnant 11/26/2023   Hx of postpartum hemorrhage, currently pregnant 11/26/2023   Supervision of other normal pregnancy, antepartum 05/05/2023   Obesity in pregnancy 09/07/2020     1. Postpartum care following vaginal delivery   2. BP check   3. Gestational hypertension, third trimester   4. Encounter for IUD insertion     Doing well postpartum.  No issues with IUD insertion.  Plan:             F/U  Return in about 4 weeks (around 02/15/2024) for For IUD f/u.  May resume normal activities.  Elonda Husky, M.D. 01/18/2024 3:33 PM

## 2024-01-18 NOTE — Progress Notes (Signed)
 Patient presents today for 6 week postpartum follow-up. Patient had a vaginal delivery.  Patient states bottle feeding is going well. She states she would like IUD for birth control, placed today. EPDS score of 7. She states no other questions or concerns at this time.

## 2024-01-20 ENCOUNTER — Encounter: Payer: Self-pay | Admitting: Nurse Practitioner

## 2024-01-20 DIAGNOSIS — R7303 Prediabetes: Secondary | ICD-10-CM | POA: Insufficient documentation

## 2024-01-31 ENCOUNTER — Telehealth: Payer: Self-pay | Admitting: Internal Medicine

## 2024-01-31 NOTE — Telephone Encounter (Signed)
 Left vm and sent mychart message to confirm 02/07/24 appointment-[name]

## 2024-02-01 DIAGNOSIS — J301 Allergic rhinitis due to pollen: Secondary | ICD-10-CM | POA: Diagnosis not present

## 2024-02-05 ENCOUNTER — Encounter: Payer: Self-pay | Admitting: Obstetrics and Gynecology

## 2024-02-05 DIAGNOSIS — J320 Chronic maxillary sinusitis: Secondary | ICD-10-CM | POA: Diagnosis not present

## 2024-02-07 ENCOUNTER — Ambulatory Visit: Admitting: Internal Medicine

## 2024-02-07 DIAGNOSIS — J4520 Mild intermittent asthma, uncomplicated: Secondary | ICD-10-CM | POA: Diagnosis not present

## 2024-02-08 DIAGNOSIS — J301 Allergic rhinitis due to pollen: Secondary | ICD-10-CM | POA: Diagnosis not present

## 2024-02-12 ENCOUNTER — Encounter: Payer: Self-pay | Admitting: Obstetrics and Gynecology

## 2024-02-13 ENCOUNTER — Ambulatory Visit (INDEPENDENT_AMBULATORY_CARE_PROVIDER_SITE_OTHER): Payer: Medicaid Other | Admitting: Dermatology

## 2024-02-13 ENCOUNTER — Encounter: Payer: Self-pay | Admitting: Dermatology

## 2024-02-13 DIAGNOSIS — L700 Acne vulgaris: Secondary | ICD-10-CM

## 2024-02-13 DIAGNOSIS — L219 Seborrheic dermatitis, unspecified: Secondary | ICD-10-CM | POA: Diagnosis not present

## 2024-02-13 DIAGNOSIS — L309 Dermatitis, unspecified: Secondary | ICD-10-CM

## 2024-02-13 DIAGNOSIS — L209 Atopic dermatitis, unspecified: Secondary | ICD-10-CM

## 2024-02-13 DIAGNOSIS — L299 Pruritus, unspecified: Secondary | ICD-10-CM

## 2024-02-13 DIAGNOSIS — L2081 Atopic neurodermatitis: Secondary | ICD-10-CM

## 2024-02-13 DIAGNOSIS — Z79899 Other long term (current) drug therapy: Secondary | ICD-10-CM

## 2024-02-13 DIAGNOSIS — Z7189 Other specified counseling: Secondary | ICD-10-CM

## 2024-02-13 MED ORDER — KETOCONAZOLE 2 % EX SHAM: MEDICATED_SHAMPOO | CUTANEOUS | 11 refills | Status: AC

## 2024-02-13 MED ORDER — AZELAIC ACID 15 % EX GEL: CUTANEOUS | 6 refills | Status: AC

## 2024-02-13 MED ORDER — OPZELURA 1.5 % EX CREA: TOPICAL_CREAM | CUTANEOUS | 2 refills | Status: AC

## 2024-02-13 MED ORDER — EUCRISA 2 % EX OINT: 1.0000 | TOPICAL_OINTMENT | CUTANEOUS | 1 refills | Status: AC

## 2024-02-13 NOTE — Progress Notes (Signed)
 Follow-Up Visit   Subjective  Sandy Burns is a 23 y.o. female who presents for the following: 6 month follow up on Atopic Dermatitis. History of flares at arms, legs, face, antecutibal areas. Has used eucrisa  and opzelura  cream to treat. Reports recently having baby in February and since then skin has been very flared especially at right hand, behind knees and between breasts.   Patient states she is not breastfeeding and is not currently pregnant.  Patient also reports she is flared at scalp and has a few scabs. Has used ketoconazole shampoo in past.  Patient reports her acne is flared, had to stop using Retin A 0.1 % cream and Benzaclin  gel. Would like to discuss treatment  The following portions of the chart were reviewed this encounter and updated as appropriate: medications, allergies, medical history  Review of Systems:  No other skin or systemic complaints except as noted in HPI or Assessment and Plan.  Objective  Well appearing patient in no apparent distress; mood and affect are within normal limits.  Areas Examined: Intramammary b/l, b/l arms, b/l hands and face   Relevant physical exam findings are noted in the Assessment and Plan.   Assessment & Plan          Acne      ACNE VULGARIS   Related Medications Azelaic Acid 15 % gel After skin is thoroughly washed and patted dry, gently but thoroughly massage a thin film of azelaic acid cream into the affected area twice daily, in the morning and evening. SEBORRHEIC DERMATITIS   Related Medications ketoconazole (NIZORAL) 2 % shampoo apply 1 time per week, massage into scalp and leave in for 5 minutes before rinsing out ATOPIC NEURODERMATITIS   Related Medications Ruxolitinib Phosphate  (OPZELURA ) 1.5 % CREA APPLY TOPICALLY TO LEGS ONCE DAILY ECZEMA, UNSPECIFIED TYPE   Related Medications Crisaborole  (EUCRISA ) 2 % OINT Apply 1 application  topically as directed. Qd to bid to eczema on hands, arms  until clear   ATOPIC DERMATITIS Exam:  hyperpigmentation and lichenification of the intermammary chest area, b/l hands. Hands with scale. See photos 14% BSA Chronic and persistent condition with duration or expected duration over one year. Condition is bothersome/symptomatic for patient. Currently flared.  Atopic dermatitis (eczema) is a chronic, relapsing, pruritic condition that can significantly affect quality of life. It is often associated with allergic rhinitis and/or asthma and can require treatment with topical medications, phototherapy, or in severe cases biologic injectable medication (Dupixent; Adbry) or Oral JAK inhibitors.   Treatment Plan: Continue Eucrisa  ointment daily  Continue Opzelura  cream apply to legs once a day  Discussed Dupixent today, patient declined starting, will continue cream/topical treatment, patient advised to contact us  in 2 months if not improving will consider Dupixent again.    Recommend gentle skin care.  ACNE VULGARIS Exam:  fine comedones of the face  Location: face  Chronic and persistent condition with duration or expected duration over one year. Condition is bothersome/symptomatic for patient. Currently flared. Treatment Plan: Stopped Benzaclin  due to recent pregnancy / birth Stopped Retin A 0.1 due to recent pregancy /birth  Start Azaelic acid gel apply topically twice daily to face for acne   SEBORRHEIC DERMATITIS Exam: Pink patches with greasy scale at scalp Chronic and persistent condition with duration or expected duration over one year. Condition is bothersome/symptomatic for patient. Currently flared. Seborrheic Dermatitis is a chronic persistent rash characterized by pinkness and scaling most commonly of the mid face but also can occur on the  scalp (dandruff), ears; mid chest, mid back and groin.  It tends to be exacerbated by stress and cooler weather.  People who have neurologic disease may experience new onset or exacerbation of  existing seborrheic dermatitis.  The condition is not curable but treatable and can be controlled. Treatment Plan: Start ketoconazole shampoo 2 % once weekly apply  times per week, massage into scalp and leave in for 5 minutes before rinsing out  Patient advised if still itchy will send rx of mometasone  lotion   Return in about 6 months (around 08/15/2024) for atopic derm, acne, seb derm .  IRandee Busing, CMA, am acting as scribe for Celine Collard, MD.   Documentation: I have reviewed the above documentation for accuracy and completeness, and I agree with the above.  Celine Collard, MD

## 2024-02-13 NOTE — Patient Instructions (Addendum)
 For eczema   Continue Eucrisa  as directed  Continue opzelura  as directed   For scalp   Start ketoconazole 2 % shampoo - apply 1  time per week, massage into scalp and leave in for 5 minutes before rinsing out   For acne  Start azelaic acid gel - apply topically to face twice daily for acne    Gentle Skin Care Guide  1. Bathe no more than once a day.  2. Avoid bathing in hot water  3. Use a mild soap like Dove, Vanicream, Cetaphil, CeraVe. Can use Lever 2000 or Cetaphil antibacterial soap  4. Use soap only where you need it. On most days, use it under your arms, between your legs, and on your feet. Let the water rinse other areas unless visibly dirty.  5. When you get out of the bath/shower, use a towel to gently blot your skin dry, don't rub it.  6. While your skin is still a little damp, apply a moisturizing cream such as Vanicream, CeraVe, Cetaphil, Eucerin, Sarna lotion or plain Vaseline Jelly. For hands apply Neutrogena [place] Hand Cream or Excipial Hand Cream.  7. Reapply moisturizer any time you start to itch or feel dry.  8. Sometimes using free and clear laundry detergents can be helpful. Fabric softener sheets should be avoided. Downy Free & Gentle liquid, or any liquid fabric softener that is free of dyes and perfumes, it acceptable to use  9. If your doctor has given you prescription creams you may apply moisturizers over them         Due to recent changes in healthcare laws, you may see results of your pathology and/or laboratory studies on MyChart before the doctors have had a chance to review them. We understand that in some cases there may be results that are confusing or concerning to you. Please understand that not all results are received at the same time and often the doctors may need to interpret multiple results in order to provide you with the best plan of care or course of treatment. Therefore, we ask that you please give us  2 business days to  thoroughly review all your results before contacting the office for clarification. Should we see a critical lab result, you will be contacted sooner.   If You Need Anything After Your Visit  If you have any questions or concerns for your doctor, please call our main line at [phone] and press option 4 to reach your doctor's medical assistant. If no one answers, please leave a voicemail as directed and we will return your call as soon as possible. Messages left after 4 pm will be answered the following business day.   You may also send us  a message via MyChart. We typically respond to MyChart messages within 1-2 business days.  For prescription refills, please ask your pharmacy to contact our office. Our fax number is [phone].  If you have an urgent issue when the clinic is closed that cannot wait until the next business day, you can page your doctor at the number below.    Please note that while we do our best to be available for urgent issues outside of office hours, we are not available 24/7.   If you have an urgent issue and are unable to reach us , you may choose to seek medical care at your doctor's office, retail clinic, urgent care center, or emergency room.  If you have a medical emergency, please immediately call 911 or go to the emergency  department.  Pager Numbers  - Dr. Bary Likes: [phone]  - Dr. Annette Barters: [phone]  - Dr. Felipe Horton: [phone]   In the event of inclement weather, please call our main line at [phone] for an update on the status of any delays or closures.  Dermatology Medication Tips: Please keep the boxes that topical medications come in in order to help keep track of the instructions about where and how to use these. Pharmacies typically print the medication instructions only on the boxes and not directly on the medication tubes.   If your medication is too expensive, please contact our office at [phone] option 4 or send us  a message  through MyChart.   We are unable to tell what your co-pay for medications will be in advance as this is different depending on your insurance coverage. However, we may be able to find a substitute medication at lower cost or fill out paperwork to get insurance to cover a needed medication.   If a prior authorization is required to get your medication covered by your insurance company, please allow us  1-2 business days to complete this process.  Drug prices often vary depending on where the prescription is filled and some pharmacies may offer cheaper prices.  The website www.goodrx.com contains coupons for medications through different pharmacies. The prices here do not account for what the cost may be with help from insurance (it may be cheaper with your insurance), but the website can give you the price if you did not use any insurance.  - You can print the associated coupon and take it with your prescription to the pharmacy.  - You may also stop by our office during regular business hours and pick up a GoodRx coupon card.  - If you need your prescription sent electronically to a different pharmacy, notify our office through [facility] or by phone at [phone] option 4.     Si Usted Necesita Algo Despus de Su Visita  Tambin puede enviarnos un mensaje a travs de Clinical cytogeneticist. Por lo general respondemos a los mensajes de MyChart en el transcurso de 1 a 2 das hbiles.  Para renovar recetas, por favor pida a su farmacia que se ponga en contacto con nuestra oficina. Franz Jacks de fax es [place] [phone].  Si tiene un asunto urgente cuando la clnica est cerrada y que no puede esperar hasta el siguiente da hbil, puede llamar/localizar a su doctor(a) al nmero que aparece a continuacin.   Por favor, tenga en cuenta que aunque hacemos todo lo posible para estar disponibles para asuntos urgentes fuera del horario de [place], no estamos disponibles las 24 horas del da, los 7 [address]  [address] de  la [place].   Si tiene un problema urgente y no puede comunicarse con nosotros, puede optar por buscar atencin mdica  en el consultorio de su doctor(a), en una clnica privada, en un centro de atencin urgente o en una sala de emergencias.  Si tiene Engineer, drilling, por favor llame inmediatamente al 911 o vaya a la sala de emergencias.  Nmeros de bper  - Dr. Bary Likes: [phone]  - Dra. Annette Barters: [phone]  - Dr. Felipe Horton: [phone]   En caso de inclemencias del tiempo, por favor llame a Lajuan Pila principal al [phone] para una actualizacin sobre el [place] de cualquier retraso o cierre.  Consejos para la medicacin en dermatologa: Por favor, guarde las cajas en las que vienen los medicamentos de uso tpico para ayudarle a seguir las instrucciones sobre dnde y cmo usarlos.  Las farmacias generalmente imprimen las instrucciones del medicamento slo en las cajas y no directamente en los tubos del [place].   Si su medicamento es muy caro, por favor, pngase en contacto con Bettyjane Brunet llamando al [phone] y presione la opcin 4 o envenos un mensaje a travs de Clinical cytogeneticist.   No podemos decirle cul ser su copago por los medicamentos por adelantado ya que esto es diferente dependiendo de la cobertura de su seguro. Sin embargo, es posible que podamos encontrar un medicamento sustituto a Audiological scientist un formulario para que el seguro cubra el medicamento que se considera necesario.   Si se requiere una autorizacin previa para que su compaa de seguros [place] su medicamento, por favor permtanos de 1 a 2 das hbiles para completar este proceso.  Los precios de los medicamentos varan con frecuencia dependiendo del Environmental consultant de dnde se surte la receta y alguna farmacias pueden ofrecer precios ms baratos.  El sitio web www.goodrx.com tiene cupones para medicamentos de Health and safety inspector. Los precios aqu no tienen en cuenta lo que podra costar con la ayuda  del seguro (puede ser ms barato con su seguro), pero el sitio web puede darle el precio si no utiliz Tourist information centre manager.  - Puede imprimir el cupn correspondiente y llevarlo con su receta a la farmacia.  - Tambin puede pasar por nuestra oficina durante el horario de atencin regular y Education officer, museum una tarjeta de cupones de GoodRx.  - Si necesita que su receta se enve electrnicamente a una farmacia diferente, informe a nuestra oficina a travs de MyChart de [place] o por telfono llamando al [phone] y presione la opcin 4.

## 2024-02-15 ENCOUNTER — Ambulatory Visit: Admitting: Obstetrics and Gynecology

## 2024-02-15 DIAGNOSIS — J301 Allergic rhinitis due to pollen: Secondary | ICD-10-CM | POA: Diagnosis not present

## 2024-02-15 DIAGNOSIS — Z30431 Encounter for routine checking of intrauterine contraceptive device: Secondary | ICD-10-CM

## 2024-02-22 DIAGNOSIS — J301 Allergic rhinitis due to pollen: Secondary | ICD-10-CM | POA: Diagnosis not present

## 2024-03-04 DIAGNOSIS — J0100 Acute maxillary sinusitis, unspecified: Secondary | ICD-10-CM | POA: Diagnosis not present

## 2024-03-11 ENCOUNTER — Encounter: Payer: Self-pay | Admitting: Obstetrics and Gynecology

## 2024-03-11 DIAGNOSIS — B009 Herpesviral infection, unspecified: Secondary | ICD-10-CM

## 2024-03-11 MED ORDER — VALACYCLOVIR HCL 500 MG PO TABS: 500.0000 mg | ORAL_TABLET | Freq: Every day | ORAL | 3 refills | Status: AC

## 2024-03-13 NOTE — Procedures (Signed)
 [facility] [address] [place], [address]    Complete Pulmonary Function Testing Interpretation:  FINDINGS:  The forced vital capacity is normal FEV1 is normal.  FEV1 FVC ratio was moderately decreased.  Total lung capacity is increased residual volume is increased FRC is increased residual in total impression ratio is increased.  DLCO is within normal limits.  Postbronchodilator there was no significant change in FEV1  IMPRESSION:  This pulmonary function study is within normal limits clinical correlation is recommended  Cordie Deters, MD [facility] Pulmonary Critical Care Medicine Sleep Medicine

## 2024-03-14 DIAGNOSIS — J301 Allergic rhinitis due to pollen: Secondary | ICD-10-CM | POA: Diagnosis not present

## 2024-03-15 ENCOUNTER — Telehealth: Payer: Self-pay | Admitting: Internal Medicine

## 2024-03-15 NOTE — Telephone Encounter (Addendum)
 Per FG. Patient has not returned calls to schedule appointment. Lvm & sent message to patient-[name]

## 2024-03-17 DIAGNOSIS — J0100 Acute maxillary sinusitis, unspecified: Secondary | ICD-10-CM | POA: Diagnosis not present

## 2024-03-19 ENCOUNTER — Encounter: Payer: Self-pay | Admitting: Obstetrics and Gynecology

## 2024-03-19 ENCOUNTER — Ambulatory Visit: Admitting: Obstetrics and Gynecology

## 2024-03-19 VITALS — BP 113/79 | HR 90 | Ht 67.0 in | Wt 235.2 lb

## 2024-03-19 DIAGNOSIS — Z30431 Encounter for routine checking of intrauterine contraceptive device: Secondary | ICD-10-CM

## 2024-03-19 NOTE — Progress Notes (Signed)
 HPI:      [name] is a 23 y.o. [id number] who LMP was No LMP recorded. (Menstrual status: IUD).  Subjective:   She presents today approximately 2 months after IUD insertion.  She reports she is doing well.  She has no longer having any bleeding.  She has had intercourse without issue.    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 She does not have any pertinent problems on file. She  has a past surgical history that includes Foot surgery (2015); Tooth extraction (2015); Repair vaginal cuff (N/A, 09/06/2020); and Wisdom tooth extraction. Her family history includes Asthma in her maternal grandmother; Carpal tunnel syndrome in her maternal grandmother; Glaucoma in her maternal grandmother; Healthy in her brother, brother, maternal grandfather, sister, and sister; Hypertension in her maternal grandmother and mother; Lupus in her mother; Rheum arthritis in her mother; Schizophrenia in her father. She  reports that she has never smoked. She has never used smokeless tobacco. She reports that she does not currently use alcohol. She reports that she does not use drugs. She has a current medication list which includes the following prescription(s): advair  diskus, albuterol , azelaic acid , cetirizine , eucrisa , epipen  2-pak, fluticasone , ibuprofen , ketoconazole , labetalol , levonorgestrel , montelukast , prenatal vitamin w/fe, fa, opzelura , sertraline , valacyclovir , and vitamin d  (ergocalciferol ). She is allergic to other, cats claw (uncaria tomentosa), dust mite extract, molds & smuts, and tree extract.       Review of Systems:  Review of Systems  Constitutional: Denied constitutional  symptoms, night sweats, recent illness, fatigue, fever, insomnia and weight loss.  Eyes: Denied eye symptoms, eye pain, photophobia, vision change and visual disturbance.  Ears/Nose/Throat/Neck: Denied ear, nose, throat or neck symptoms, hearing loss, nasal discharge, sinus congestion and sore throat.  Cardiovascular: Denied cardiovascular symptoms, arrhythmia, chest pain/pressure, edema, exercise intolerance, orthopnea and palpitations.  Respiratory: Denied pulmonary symptoms, asthma, pleuritic pain, productive sputum, cough, dyspnea and wheezing.  Gastrointestinal: Denied, gastro-esophageal reflux, melena, nausea and vomiting.  Genitourinary: Denied genitourinary symptoms including symptomatic vaginal discharge, pelvic relaxation issues, and urinary complaints.  Musculoskeletal: Denied musculoskeletal symptoms, stiffness, swelling, muscle weakness and myalgia.  Dermatologic: Denied dermatology symptoms, rash and scar.  Neurologic: Denied neurology symptoms, dizziness, headache, neck pain and syncope.  Psychiatric: Denied psychiatric symptoms, anxiety and depression.  Endocrine: Denied endocrine symptoms including hot flashes and night sweats.   Meds:   Current Outpatient Medications on File Prior to Visit  Medication Sig Dispense Refill   ADVAIR  DISKUS 100-50 MCG/ACT AEPB INHALE 1 PUFF INTO THE LUNGS TWICE DAILY 60 each 11   albuterol  (VENTOLIN  HFA) 108 (90 Base) MCG/ACT inhaler INHALE 2 PUFFS INTO THE LUNGS EVERY 4 HOURS AS NEEDED FOR WHEEZING OR SHORTNESS OF BREATH 18 g 3   Azelaic Acid  15 % gel After skin is thoroughly washed and patted dry, gently but thoroughly massage a thin film of azelaic acid  cream into the affected area twice daily, in the morning and evening. 30 g 6   cetirizine  (ZYRTEC ) 10 MG tablet Take 10 mg by mouth daily as needed.     Crisaborole  (EUCRISA ) 2 % OINT Apply 1 application  topically as directed. Qd to bid to eczema on hands, arms until clear 180  g 1   EPIPEN   2-PAK 0.3 MG/0.3ML SOAJ injection Inject 0.3 mg into the muscle as needed for anaphylaxis. 1 each 0   fluticasone  (FLONASE ) 50 MCG/ACT nasal spray SHAKE LIQUID AND USE 1 SPRAY IN EACH NOSTRIL DAILY 16 g 5   ibuprofen  (ADVIL ) 600 MG tablet Take 1 tablet (600 mg total) by mouth every 6 (six) hours. 30 tablet 0   ketoconazole  (NIZORAL ) 2 % shampoo apply 1 time per week, massage into scalp and leave in for 5 minutes before rinsing out 120 mL 11   labetalol  (NORMODYNE ) 100 MG tablet Take 1 tablet (100 mg total) by mouth 2 (two) times daily. 60 tablet 3   levonorgestrel  (MIRENA ) 20 MCG/DAY IUD 1 each by Intrauterine route once.     montelukast  (SINGULAIR ) 10 MG tablet TAKE 1 TABLET(10 MG) BY MOUTH AT BEDTIME 90 tablet 3   prenatal vitamin w/FE, FA (NATACHEW) 29-1 MG CHEW chewable tablet Chew 2 tablets by mouth daily at 12 noon. 30 tablet 9   Ruxolitinib Phosphate  (OPZELURA ) 1.5 % CREA APPLY TOPICALLY TO LEGS ONCE DAILY 60 g 2   sertraline  (ZOLOFT ) 25 MG tablet Take 1 tablet (25 mg total) by mouth daily. 30 tablet 3   valACYclovir  (VALTREX ) 500 MG tablet Take 1 tablet (500 mg total) by mouth daily. Can increase to twice a day for 5 days in the event of a recurrence 30 tablet 3   Vitamin D , Ergocalciferol , (DRISDOL ) 1.25 MG (50000 UNIT) CAPS capsule TAKE 1 CAPSULE BY MOUTH EVERY 7 DAYS 12 capsule 1   No current facility-administered medications on file prior to visit.      Objective:      Vitals:   03/19/24 1415  BP: 113/79  Pulse: 90   Filed Weights   03/19/24 1415  Weight: 235 lb 3.2 oz (106.7 kg)              Physical examination   Pelvic:   Vulva: Normal appearance.  No lesions.  Vagina: No lesions or abnormalities noted.  Support: Normal pelvic support.  Urethra No masses tenderness or scarring.  Meatus Normal size without lesions or prolapse.  Cervix: Normal appearance.  No lesions. IUD strings noted at cervical os.  Anus: Normal exam.  No lesions.  Perineum: Normal exam.  No  lesions.        Bimanual   Uterus: Normal size.  Non-tender.  Mobile.  AV.  Adnexae: No masses.  Non-tender to palpation.  Cul-de-sac: Negative for abnormality.             Assessment:     [id number] Patient Active Problem List   Diagnosis Date Noted   Prediabetes 01/20/2024   Encounter for induction of labor 12/03/2023   Postpartum care following vaginal delivery 12/03/2023   Encounter for care or examination of lactating mother 12/03/2023   Hx of pre-eclampsia in prior pregnancy, currently pregnant 11/26/2023   Hx of postpartum hemorrhage, currently pregnant 11/26/2023   Supervision of other normal pregnancy, antepartum 05/05/2023   Obesity in pregnancy 09/07/2020     1. Encounter for routine checking of intrauterine contraceptive device (IUD)     Doing well with IUD   Plan:            1.  Follow-up for annual examination. Orders No orders of the defined types were placed in this encounter.   No orders of the defined types were placed in this encounter.     F/U  No follow-ups on file.  Myrtie Atkinson  Wyvonne Height, M.D. 03/19/2024 2:38 PM

## 2024-03-19 NOTE — Progress Notes (Signed)
 Patient presents today for IUD string check. She states not bleeding or any pain and  discomfort. No other questions or concerns.

## 2024-03-26 ENCOUNTER — Ambulatory Visit: Admitting: Internal Medicine

## 2024-03-26 ENCOUNTER — Encounter: Payer: Self-pay | Admitting: Nurse Practitioner

## 2024-03-26 ENCOUNTER — Encounter: Payer: Self-pay | Admitting: Internal Medicine

## 2024-03-26 VITALS — BP 130/80 | HR 90 | Temp 97.8°F | Resp 16 | Ht 67.0 in | Wt 238.0 lb

## 2024-03-26 DIAGNOSIS — J4520 Mild intermittent asthma, uncomplicated: Secondary | ICD-10-CM

## 2024-03-26 DIAGNOSIS — G4719 Other hypersomnia: Secondary | ICD-10-CM | POA: Diagnosis not present

## 2024-03-26 NOTE — Patient Instructions (Signed)
 Asthma, Adult  Asthma is a condition that causes swelling and narrowing of the airways. These are the passages that lead from the nose and mouth down into the lungs. When asthma symptoms get worse it is called an asthma attack or flare. This can make it hard to breathe. Asthma flares can range from minor to life-threatening. There is no cure for asthma, but medicines and lifestyle changes can help to control it. What are the causes? It is not known exactly what causes asthma, but certain things can cause asthma symptoms to get worse (triggers). What can trigger an asthma attack? Cigarette smoke. Mold. Dust. Your pet's skin flakes (dander). Cockroaches. Pollen. Air pollution (like household cleaners, wood smoke, smog, or Therapist, occupational). What are the signs or symptoms? Trouble breathing (shortness of breath). Coughing. Making high-pitched whistling sounds when you breathe, most often when you breathe out (wheezing). Chest tightness. Tiredness with little activity. Poor exercise tolerance. How is this treated? Controller medicines that help prevent asthma symptoms. Fast-acting reliever or rescue medicines. These give short-term relief of asthma symptoms. Allergy medicines if your attacks are brought on by allergens. Medicines to help control the body's defense (immune) system. Staying away from the things that cause asthma attacks. Follow these instructions at home: Avoiding triggers in your home Do not allow anyone to smoke in your home. Limit use of fireplaces and wood stoves. Get rid of pests (such as roaches and mice) and their droppings. Keep your home clean. Clean your floors. Dust regularly. Use cleaning products that do not smell. Wash bed sheets and blankets every week in hot water. Dry them in a dryer. Have someone vacuum when you are not home. Change your heating and air conditioning filters often. Use blankets that are made of polyester or cotton. General  instructions Take over-the-counter and prescription medicines only as told by your doctor. Do not smoke or use any products that contain nicotine or tobacco. If you need help quitting, ask your doctor. Stay away from secondhand smoke. Avoid doing things outdoors when allergen counts are high and when air quality is low. Warm up before you exercise. Take time to cool down after exercise. Use a peak flow meter as told by your doctor. A peak flow meter is a tool that measures how well your lungs are working. Keep track of the peak flow meter's readings. Write them down. Follow your asthma action plan. This is a written plan for taking care of your asthma and treating your attacks. Make sure you get all the shots (vaccines) that your doctor recommends. Ask your doctor about a flu shot and a pneumonia shot. Keep all follow-up visits. Contact a doctor if: You have wheezing, shortness of breath, or a cough even while taking medicine to prevent attacks. The mucus you cough up (sputum) is thicker than usual. The mucus you cough up changes from clear or white to yellow, green, gray, or is bloody. You have problems from the medicine you are taking, such as: A rash. Itching. Swelling. Trouble breathing. You need reliever medicines more than 2-3 times a week. Your peak flow reading is still at 50-79% of your personal best after following the action plan for 1 hour. You have a fever. Get help right away if: You seem to be worse and are not responding to medicine during an asthma attack. You are short of breath even at rest. You get short of breath when doing very little activity. You have trouble eating, drinking, or talking. You have chest  pain or tightness. You have a fast heartbeat. Your lips or fingernails start to turn blue. You are light-headed or dizzy, or you faint. Your peak flow is less than 50% of your personal best. You feel too tired to breathe normally. These symptoms may be an  emergency. Get help right away. Call 911. Do not wait to see if the symptoms will go away. Do not drive yourself to the hospital. Summary Asthma is a long-term (chronic) condition in which the airways get tight and narrow. An asthma attack can make it hard to breathe. Asthma cannot be cured, but medicines and lifestyle changes can help control it. Make sure you understand how to avoid triggers and how and when to use your medicines. Avoid things that can cause allergy symptoms (allergens). These include animal skin flakes (dander) and pollen from trees or grass. Avoid things that pollute the air. These may include household cleaners, wood smoke, smog, or chemical odors. This information is not intended to replace advice given to you by your health care provider. Make sure you discuss any questions you have with your health care provider. Document Revised: 07/05/2021 Document Reviewed: 07/05/2021 Elsevier Patient Education  2024 [employer].

## 2024-03-26 NOTE — Progress Notes (Unsigned)
 [facility] [address] [place], [place] [address]  Pulmonary Sleep Medicine   Office Visit Note  Patient Name: Sandy Burns DOB: [birth year] MRN [id number]  Date of Service: 03/26/2024  Complaints/HPI: She had her PFT done and she states she is feeling good now. Her PFT results were noted and were in normal limits. She has given birth to her baby no issues with the delivery. She has advair  which she is using and also has her escue inhaler noted that she uses it infrequently  Office Spirometry Results:     ROS  General: (-) fever, (-) chills, (-) night sweats, (-) weakness Skin: (-) rashes, (-) itching,. Eyes: (-) visual changes, (-) redness, (-) itching. Nose and Sinuses: (-) nasal stuffiness or itchiness, (-) postnasal drip, (-) nosebleeds, (-) sinus trouble. Mouth and Throat: (-) sore throat, (-) hoarseness. Neck: (-) swollen glands, (-) enlarged thyroid , (-) neck pain. Respiratory: - cough, (-) bloody sputum, - shortness of breath, - wheezing. Cardiovascular: - ankle swelling, (-) chest pain. Lymphatic: (-) lymph node enlargement. Neurologic: (-) numbness, (-) tingling. Psychiatric: (-) anxiety, (-) depression   Current Medication: Outpatient Encounter Medications as of 03/26/2024  Medication Sig   ADVAIR  DISKUS 100-50 MCG/ACT AEPB INHALE 1 PUFF INTO THE LUNGS TWICE DAILY   albuterol  (VENTOLIN  HFA) 108 (90 Base) MCG/ACT inhaler INHALE 2 PUFFS INTO THE LUNGS EVERY 4 HOURS AS NEEDED FOR WHEEZING OR SHORTNESS OF BREATH   Azelaic Acid  15 % gel After skin is thoroughly washed and patted dry, gently but thoroughly massage a thin film of azelaic acid  cream into the affected area twice daily, in the morning and evening.   cetirizine  (ZYRTEC ) 10 MG tablet Take 10 mg by mouth daily as needed.   Crisaborole  (EUCRISA ) 2 % OINT Apply 1 application  topically as directed. Qd to bid to eczema on hands, arms until clear   EPIPEN  2-PAK 0.3 MG/0.3ML SOAJ injection Inject 0.3  mg into the muscle as needed for anaphylaxis.   fluticasone  (FLONASE ) 50 MCG/ACT nasal spray SHAKE LIQUID AND USE 1 SPRAY IN EACH NOSTRIL DAILY   ibuprofen  (ADVIL ) 600 MG tablet Take 1 tablet (600 mg total) by mouth every 6 (six) hours.   ketoconazole  (NIZORAL ) 2 % shampoo apply 1 time per week, massage into scalp and leave in for 5 minutes before rinsing out   labetalol  (NORMODYNE ) 100 MG tablet Take 1 tablet (100 mg total) by mouth 2 (two) times daily.   levonorgestrel  (MIRENA ) 20 MCG/DAY IUD 1 each by Intrauterine route once.   montelukast  (SINGULAIR ) 10 MG tablet TAKE 1 TABLET(10 MG) BY MOUTH AT BEDTIME   prenatal vitamin w/FE, FA (NATACHEW) 29-1 MG CHEW chewable tablet Chew 2 tablets by mouth daily at 12 noon.   Ruxolitinib Phosphate  (OPZELURA ) 1.5 % CREA APPLY TOPICALLY TO LEGS ONCE DAILY   sertraline  (ZOLOFT ) 25 MG tablet Take 1 tablet (25 mg total) by mouth daily.   valACYclovir  (VALTREX ) 500 MG tablet Take 1 tablet (500 mg total) by mouth daily. Can increase to twice a day for 5 days in the event of a recurrence   Vitamin D , Ergocalciferol , (DRISDOL ) 1.25 MG (50000 UNIT) CAPS capsule TAKE 1 CAPSULE BY MOUTH EVERY 7 DAYS   No facility-administered encounter medications on file as of 03/26/2024.    Surgical History: Past Surgical History:  Procedure Laterality Date   FOOT SURGERY  2015   REPAIR VAGINAL CUFF N/A 09/06/2020   Procedure: REPAIR VAGINAL CUFF;  Surgeon: Teresa Fender, MD;  Location: [facility];  Service: Gynecology;  Laterality: N/A;   TOOTH EXTRACTION  2015   WISDOM TOOTH EXTRACTION     four; age 58    Medical History: Past Medical History:  Diagnosis Date   Anemia    Asthma    Delay 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/07/2020   Eczema    Environmental allergies    History of blood transfusion    after giving birth 09/06/2020   HSV-1 (herpes simplex virus 1) infection    HSV-2 infection    Vaginal vault hematoma 09/07/2020    Family History: Family History  Problem  Relation Age of Onset   Rheum arthritis Mother    Hypertension Mother    Lupus Mother    Schizophrenia Father    Healthy Sister    Healthy Sister    Healthy Brother    Healthy Brother    Asthma Maternal Grandmother    Hypertension Maternal Grandmother    Glaucoma Maternal Grandmother    Carpal tunnel syndrome Maternal Grandmother    Healthy Maternal Grandfather     Social History: Social History   Socioeconomic History   Marital status: Single    Spouse name: Not on file   Number of children: 2   Years of education: 14   Highest education level: Not on file  Occupational History   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    Comment: Mirena  01/18/24  Other Topics Concern   Not on file  Social History Narrative   Not on file   Social Drivers of Health   Financial Resource Strain: Medium Risk (05/05/2023)   Overall Financial Resource Strain (CARDIA)    Difficulty of Paying Living Expenses: Somewhat hard  Food Insecurity: No Food Insecurity (11/30/2023)   Hunger Vital Sign    Worried About Running Out of Food in the Last Year: Never true    Ran Out of Food in the Last Year: Never true  Transportation Needs: No Transportation Needs (11/30/2023)   PRAPARE - Administrator, Civil Service (Medical): No    Lack of Transportation (Non-Medical): No  Physical Activity: Insufficiently Active (05/05/2023)   Exercise Vital Sign    Days of Exercise per Week: 1 day    Minutes of Exercise per Session: 40 min  Stress: No Stress Concern Present (05/05/2023)   [employer] of Occupational Health - Occupational Stress Questionnaire    Feeling of Stress : Not at all  Social  Connections: Unknown (05/05/2023)   Social Connection and Isolation Panel    Frequency of Communication with Friends and Family: Once a week    Frequency of Social Gatherings with Friends and Family: Three times a week    Attends Religious Services: Never    Active Member of Clubs or Organizations: No    Attends Banker Meetings: Never    Marital Status: Not on file  Intimate Partner Violence: Not At Risk (11/30/2023)   Humiliation, Afraid, Rape, and Kick questionnaire    Fear of Current or Ex-Partner: No    Emotionally Abused: No    Physically Abused: No    Sexually Abused: No    Vital Signs:  Blood pressure 130/80, pulse 90, temperature 97.8 F (36.6 C), resp. rate 16, height 5' 7 (1.702 m), weight 238 lb (108 kg), SpO2 99%, not currently breastfeeding.  Examination: General Appearance: The patient is well-developed, well-nourished, and in no distress. Skin: Gross inspection of skin unremarkable. Head: normocephalic, no gross deformities. Eyes: no gross deformities noted. ENT: ears appear grossly normal no exudates. Neck: Supple. No thyromegaly. No LAD. Respiratory: no rhonchi noted at this time. Cardiovascular: Normal S1 and S2 without murmur or rub. Extremities: No cyanosis. pulses are equal. Neurologic: Alert and oriented. No involuntary movements.  LABS: No results found for this or any previous visit (from the past 2160 hours).  Radiology: US  SOFT TISSUE LOWER EXTREMITY LIMITED RIGHT (NON-VASCULAR) Result Date: 01/02/2024 CLINICAL DATA:  Lump in lower right leg near the base of the knee EXAM: ULTRASOUND right LOWER EXTREMITY LIMITED TECHNIQUE: Ultrasound examination of the lower extremity soft tissues was performed in the area of clinical concern. COMPARISON:  None Available. FINDINGS: Joint Space: No effusion. Muscles: Normal. Tendons: Normal Other Soft Tissue Structures: Palpable abnormality correlates with a hypoechoic nodule in the subcutaneous tissues  measuring 1.1 x 0.8 x 0.6 cm. There is not vascular, not connected to any vascular structure. Significance unclear may correlate with a soft tissue mass. Nonspecific characteristics. Correlate clinically. Perhaps a if clinically indicated a ultrasound-guided aspiration-biopsy recommended as clinically needed. IMPRESSION: Palpable abnormality correlates with a hypoechoic nodule in the subcutaneous tissues measuring 1.1 x 0.8 x 0.6 cm. There is not vascular, not connected to any vascular structure. Significance unclear may correlate with a soft tissue mass. Nonspecific characteristics. Perhaps a if clinically indicated a ultrasound-guided aspiration-biopsy recommended as clinically needed. Electronically Signed   By: Fredrich Jefferson M.D.   On: 01/02/2024 09:25    No results found.  No results found.  Assessment and Plan: Patient Active Problem List   Diagnosis Date Noted   Prediabetes 01/20/2024   Encounter for induction of labor 12/03/2023   Postpartum care following vaginal delivery 12/03/2023   Encounter for care or examination of lactating mother 12/03/2023   Hx of pre-eclampsia in prior pregnancy, currently pregnant 11/26/2023   Hx of postpartum hemorrhage, currently pregnant 11/26/2023   Supervision of other normal pregnancy, antepartum 05/05/2023   Obesity in pregnancy 09/07/2020    1. Mild intermittent asthma without complication (Primary) She is under better control.  She has done well since she delivered her baby.  She is not requiring as much in the way of her inhalers.  She should continue using as needed inhalers as tolerated.  2. Obesity, morbid (HCC) Spoke to her at length about the obesity and about working on losing weight.  She would like to know about going on some kind of medication for obesity suggested that she speak with her primary care team at this time.  3. Other hypersomnia She is apparently scheduled for a PSG in July    General Counseling: I have discussed the  findings of the evaluation and examination with Aaisha.  I have also discussed any further diagnostic evaluation thatmay be needed or ordered today. Marijke verbalizes understanding of the findings of todays visit. We also reviewed her medications today and discussed drug interactions and side effects including but not limited excessive drowsiness and altered mental states. We also discussed that there is always a risk not just to her but also people around her. she has been encouraged to call the office with any questions or concerns that should arise related to todays  visit.  No orders of the defined types were placed in this encounter.    Time spent: 40  I have personally obtained a history, examined the patient, evaluated laboratory and imaging results, formulated the assessment and plan and placed orders.    Cordie Deters, MD [facility] Pulmonary and Critical Care Sleep medicine

## 2024-03-28 DIAGNOSIS — J301 Allergic rhinitis due to pollen: Secondary | ICD-10-CM | POA: Diagnosis not present

## 2024-04-04 DIAGNOSIS — J301 Allergic rhinitis due to pollen: Secondary | ICD-10-CM | POA: Diagnosis not present

## 2024-04-05 DIAGNOSIS — J069 Acute upper respiratory infection, unspecified: Secondary | ICD-10-CM | POA: Diagnosis not present

## 2024-04-05 DIAGNOSIS — Z20822 Contact with and (suspected) exposure to covid-19: Secondary | ICD-10-CM | POA: Diagnosis not present

## 2024-04-17 DIAGNOSIS — J069 Acute upper respiratory infection, unspecified: Secondary | ICD-10-CM | POA: Diagnosis not present

## 2024-04-17 DIAGNOSIS — Z20822 Contact with and (suspected) exposure to covid-19: Secondary | ICD-10-CM | POA: Diagnosis not present

## 2024-04-18 DIAGNOSIS — J301 Allergic rhinitis due to pollen: Secondary | ICD-10-CM | POA: Diagnosis not present

## 2024-04-23 ENCOUNTER — Encounter: Payer: Self-pay | Admitting: Dermatology

## 2024-04-23 DIAGNOSIS — L700 Acne vulgaris: Secondary | ICD-10-CM

## 2024-04-24 MED ORDER — BENZACLIN 1-5 % EX GEL: CUTANEOUS | 11 refills | Status: AC

## 2024-04-24 NOTE — Addendum Note (Signed)
 Addended by: TANDA SETTER A on: 04/24/2024 10:30 AM   Modules accepted: Orders

## 2024-04-25 DIAGNOSIS — J301 Allergic rhinitis due to pollen: Secondary | ICD-10-CM | POA: Diagnosis not present

## 2024-04-28 ENCOUNTER — Encounter: Payer: Self-pay | Admitting: Nurse Practitioner

## 2024-04-29 ENCOUNTER — Encounter: Payer: Self-pay | Admitting: Obstetrics and Gynecology

## 2024-04-30 DIAGNOSIS — J069 Acute upper respiratory infection, unspecified: Secondary | ICD-10-CM | POA: Diagnosis not present

## 2024-04-30 LAB — PULMONARY FUNCTION TEST

## 2024-05-02 DIAGNOSIS — J301 Allergic rhinitis due to pollen: Secondary | ICD-10-CM | POA: Diagnosis not present

## 2024-05-08 ENCOUNTER — Ambulatory Visit: Admitting: Nurse Practitioner

## 2024-05-08 ENCOUNTER — Encounter: Payer: Self-pay | Admitting: Nurse Practitioner

## 2024-05-09 DIAGNOSIS — J301 Allergic rhinitis due to pollen: Secondary | ICD-10-CM | POA: Diagnosis not present

## 2024-05-16 DIAGNOSIS — J301 Allergic rhinitis due to pollen: Secondary | ICD-10-CM | POA: Diagnosis not present

## 2024-05-23 DIAGNOSIS — J301 Allergic rhinitis due to pollen: Secondary | ICD-10-CM | POA: Diagnosis not present

## 2024-05-30 DIAGNOSIS — J301 Allergic rhinitis due to pollen: Secondary | ICD-10-CM | POA: Diagnosis not present

## 2024-06-06 DIAGNOSIS — J301 Allergic rhinitis due to pollen: Secondary | ICD-10-CM | POA: Diagnosis not present

## 2024-06-08 DIAGNOSIS — H6692 Otitis media, unspecified, left ear: Secondary | ICD-10-CM | POA: Diagnosis not present

## 2024-06-08 DIAGNOSIS — Z20822 Contact with and (suspected) exposure to covid-19: Secondary | ICD-10-CM | POA: Diagnosis not present

## 2024-06-13 DIAGNOSIS — J301 Allergic rhinitis due to pollen: Secondary | ICD-10-CM | POA: Diagnosis not present

## 2024-06-17 ENCOUNTER — Ambulatory Visit: Admitting: Nurse Practitioner

## 2024-06-17 ENCOUNTER — Encounter: Payer: Self-pay | Admitting: Nurse Practitioner

## 2024-06-17 VITALS — BP 124/86 | HR 90 | Temp 98.2°F | Resp 16 | Ht 67.0 in | Wt 243.6 lb

## 2024-06-17 DIAGNOSIS — E559 Vitamin D deficiency, unspecified: Secondary | ICD-10-CM | POA: Diagnosis not present

## 2024-06-17 DIAGNOSIS — F419 Anxiety disorder, unspecified: Secondary | ICD-10-CM

## 2024-06-17 DIAGNOSIS — E66812 Obesity, class 2: Secondary | ICD-10-CM

## 2024-06-17 DIAGNOSIS — R7303 Prediabetes: Secondary | ICD-10-CM

## 2024-06-17 DIAGNOSIS — Z6838 Body mass index (BMI) 38.0-38.9, adult: Secondary | ICD-10-CM

## 2024-06-17 MED ORDER — WEGOVY 0.5 MG/0.5ML ~~LOC~~ SOAJ: 0.5000 mg | SUBCUTANEOUS | 5 refills | Status: DC

## 2024-06-17 MED ORDER — VITAMIN D (ERGOCALCIFEROL) 1.25 MG (50000 UNIT) PO CAPS: 50000.0000 [IU] | ORAL_CAPSULE | ORAL | 1 refills | Status: DC

## 2024-06-17 NOTE — Progress Notes (Signed)
 [facility] [address] [place], [place] [address]  Internal MEDICINE  Office Visit Note  Patient Name: Randie Bloodgood  [id number]  [id number]  Date of Service: 06/17/2024  Chief Complaint  Patient presents with   Follow-up    HPI Julieann presents for a follow-up visit for prediabetes, low vitamin D  and weight loss.  Prediabetes -- last A1c was 5.9.  Vitamin D  deficiency -- has been taking weekly supplement, needs refills.  Weight loss -- BMI is elevated at 38. She is interested in getting some help to lose weight. She previously tried phentermine  with modest results. Due to her prediabetes, she would benefit from a GLP-1 RA.     Current Medication: Outpatient Encounter Medications as of 06/17/2024  Medication Sig   escitalopram (LEXAPRO) 10 MG tablet Take 10 mg by mouth daily.   semaglutide -weight management (WEGOVY ) 0.5 MG/0.5ML SOAJ SQ injection Inject 0.5 mg into the skin once a week.   ADVAIR  DISKUS 100-50 MCG/ACT AEPB INHALE 1 PUFF INTO THE LUNGS TWICE DAILY   albuterol  (VENTOLIN  HFA) 108 (90 Base) MCG/ACT inhaler INHALE 2 PUFFS INTO THE LUNGS EVERY 4 HOURS AS NEEDED FOR WHEEZING OR SHORTNESS OF BREATH   Azelaic Acid  15 % gel After skin is thoroughly washed and patted dry, gently but thoroughly massage a thin film of azelaic acid  cream into the affected area twice daily, in the morning and evening.   BENZACLIN  gel Apply to face daily   cetirizine  (ZYRTEC ) 10 MG tablet Take 10 mg by mouth daily as needed.   EPIPEN  2-PAK 0.3 MG/0.3ML SOAJ injection Inject 0.3 mg into the muscle as needed for anaphylaxis.   fluticasone  (FLONASE ) 50 MCG/ACT nasal spray SHAKE LIQUID AND USE 1 SPRAY IN EACH NOSTRIL DAILY   ibuprofen  (ADVIL ) 600 MG tablet Take 1 tablet (600 mg total) by mouth every 6 (six) hours.   ketoconazole  (NIZORAL ) 2 % shampoo apply 1 time per week, massage into scalp and leave in for 5 minutes before rinsing out   labetalol  (NORMODYNE ) 100 MG tablet Take 1 tablet  (100 mg total) by mouth 2 (two) times daily.   levonorgestrel  (MIRENA ) 20 MCG/DAY IUD 1 each by Intrauterine route once.   montelukast  (SINGULAIR ) 10 MG tablet TAKE 1 TABLET(10 MG) BY MOUTH AT BEDTIME   prenatal vitamin w/FE, FA (NATACHEW) 29-1 MG CHEW chewable tablet Chew 2 tablets by mouth daily at 12 noon.   Ruxolitinib Phosphate  (OPZELURA ) 1.5 % CREA APPLY TOPICALLY TO LEGS ONCE DAILY   valACYclovir  (VALTREX ) 500 MG tablet Take 1 tablet (500 mg total) by mouth daily. Can increase to twice a day for 5 days in the event of a recurrence   Vitamin D , Ergocalciferol , (DRISDOL ) 1.25 MG (50000 UNIT) CAPS capsule Take 1 capsule (50,000 Units total) by mouth every 7 (seven) days.   [DISCONTINUED] sertraline  (ZOLOFT ) 25 MG tablet Take 1 tablet (25 mg total) by mouth daily.   [DISCONTINUED] Vitamin D , Ergocalciferol , (DRISDOL ) 1.25 MG (50000 UNIT) CAPS capsule TAKE 1 CAPSULE BY MOUTH EVERY 7 DAYS   No facility-administered encounter medications on file as of 06/17/2024.    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    Vaginal vault hematoma 09/07/2020    Family History: Family History  Problem Relation Age of Onset   Rheum arthritis Mother    Hypertension Mother    Lupus Mother    Schizophrenia Father    Healthy Sister    Healthy Sister    Healthy Brother    Healthy Brother    Asthma Maternal Grandmother    Hypertension Maternal  Grandmother    Glaucoma Maternal Grandmother    Carpal tunnel syndrome Maternal Grandmother    Healthy Maternal Grandfather     Social History   Socioeconomic History   Marital status: Single    Spouse name: Not on file   Number of children: 2   Years of education: 14   Highest education level: Not on file  Occupational History   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    Comment: Mirena  01/18/24  Other Topics Concern   Not on file  Social History Narrative   Not on file   Social Drivers of Health   Financial Resource Strain: Medium Risk (05/05/2023)   Overall Financial Resource Strain (CARDIA)    Difficulty of Paying Living Expenses: Somewhat hard  Food Insecurity: No Food Insecurity (11/30/2023)   Hunger Vital Sign    Worried About Running Out of Food in the Last Year: Never true    Ran Out of Food in the Last Year: Never true  Transportation Needs: No Transportation Needs (11/30/2023)   PRAPARE - Administrator, Civil Service (Medical): No    Lack of Transportation (Non-Medical): No  Physical Activity: Insufficiently Active (05/05/2023)   Exercise Vital Sign    Days of Exercise per Week: 1 day    Minutes of Exercise per Session: 40 min  Stress: No Stress Concern Present (05/05/2023)   [employer] of Occupational Health - Occupational Stress Questionnaire    Feeling of Stress : Not at all  Social Connections: Unknown (05/05/2023)   Social Connection and Isolation Panel    Frequency of Communication with Friends and Family: Once a week    Frequency of Social Gatherings with Friends and Family: Three times a week    Attends Religious Services: Never     Active Member of Clubs or Organizations: No    Attends Banker Meetings: Never    Marital Status: Not on file  Intimate Partner Violence: Not At Risk (11/30/2023)   Humiliation, Afraid, Rape, and Kick questionnaire    Fear of Current or Ex-Partner: No    Emotionally Abused: No    Physically Abused: No    Sexually Abused: No      Review of Systems  Constitutional:  Positive for appetite change and unexpected weight change. Negative for chills, fatigue and fever.  HENT:  Positive for postnasal drip. Negative for congestion, rhinorrhea, sinus pressure,  sinus pain, sneezing and sore throat.   Respiratory:  Positive for shortness of breath (intermittent). Negative for cough, chest tightness and wheezing.   Cardiovascular: Negative.  Negative for chest pain and palpitations.  Gastrointestinal: Negative.  Negative for abdominal pain, constipation, diarrhea, nausea and vomiting.  Musculoskeletal:  Negative for arthralgias.  Psychiatric/Behavioral:  Positive for behavioral problems.     Vital Signs: BP 124/86   Pulse 90   Temp 98.2 F (36.8 C)   Resp 16   Ht 5' 7 (1.702 m)   Wt 243 lb 9.6 oz (110.5 kg)   SpO2 99%   BMI 38.15 kg/m    Physical Exam Vitals reviewed.  Constitutional:      General: She is not in acute distress.    Appearance: Normal appearance. She is obese. She is not ill-appearing.  HENT:     Head: Normocephalic and atraumatic.  Eyes:     Pupils: Pupils are equal, round, and reactive to light.  Cardiovascular:     Rate and Rhythm: Normal rate and regular rhythm.  Pulmonary:     Effort: Pulmonary effort is normal. No respiratory distress.  Skin:    Capillary Refill: Capillary refill takes less than 2 seconds.  Neurological:     Mental Status: She is alert and oriented to person, place, and time.  Psychiatric:        Mood and Affect: Mood is depressed.        Speech: Speech is delayed.        Behavior: Behavior is slowed. Behavior is  cooperative.        Thought Content: Thought content is not paranoid or delusional. Thought content does not include homicidal or suicidal ideation.        Assessment/Plan: 1. Prediabetes (Primary) Restart wegovy  for weight loss which will also help improve her a1c - semaglutide -weight management (WEGOVY ) 0.5 MG/0.5ML SOAJ SQ injection; Inject 0.5 mg into the skin once a week.  Dispense: 2 mL; Refill: 5  2. Vitamin D  deficiency Continue weekly vitamin D  supplement as prescribed - Vitamin D , Ergocalciferol , (DRISDOL ) 1.25 MG (50000 UNIT) CAPS capsule; Take 1 capsule (50,000 Units total) by mouth every 7 (seven) days.  Dispense: 12 capsule; Refill: 1  3. Class 2 severe obesity due to excess calories with serious comorbidity and body mass index (BMI) of 38.0 to 38.9 in adult [facility]) Restart wegovy  as prescribed.  - semaglutide -weight management (WEGOVY ) 0.5 MG/0.5ML SOAJ SQ injection; Inject 0.5 mg into the skin once a week.  Dispense: 2 mL; Refill: 5  4. Anxiety Continue lexapro as prescribed  - escitalopram (LEXAPRO) 10 MG tablet; Take 10 mg by mouth daily.   General Counseling: Fate verbalizes understanding of the findings of todays visit and agrees with plan of treatment. I have discussed any further diagnostic evaluation that may be needed or ordered today. We also reviewed her medications today. she has been encouraged to call the office with any questions or concerns that should arise related to todays visit.    No orders of the defined types were placed in this encounter.   Meds ordered this encounter  Medications   Vitamin D , Ergocalciferol , (DRISDOL ) 1.25 MG (50000 UNIT) CAPS capsule    Sig: Take 1 capsule (50,000 Units total) by mouth every 7 (seven) days.    Dispense:  12 capsule    Refill:  1   semaglutide -weight management (WEGOVY ) 0.5 MG/0.5ML SOAJ SQ injection    Sig: Inject 0.5 mg into the skin once a week.  Dispense:  2 mL    Refill:  5    Dx code E66.01,  fill new script asap, please send prior auth request asap so we can get this approved.    Return in about 1 month (around 07/17/2024) for F/U, eval new med, Janeene Sand PCP.   Total time spent:30 Minutes Time spent includes review of chart, medications, test results, and follow up plan with the patient.   [place] Controlled Substance Database was reviewed by me.  This patient was seen by Mardy Maxin, FNP-C in collaboration with Dr. Sigrid Bathe as a part of collaborative care agreement.   Adalie Mand R. Maxin, MSN, FNP-C Internal medicine

## 2024-06-18 ENCOUNTER — Encounter: Payer: Self-pay | Admitting: Nurse Practitioner

## 2024-06-18 DIAGNOSIS — J301 Allergic rhinitis due to pollen: Secondary | ICD-10-CM | POA: Diagnosis not present

## 2024-06-20 DIAGNOSIS — J301 Allergic rhinitis due to pollen: Secondary | ICD-10-CM | POA: Diagnosis not present

## 2024-06-21 ENCOUNTER — Encounter: Payer: Self-pay | Admitting: Nurse Practitioner

## 2024-06-27 ENCOUNTER — Telehealth: Payer: Self-pay | Admitting: Internal Medicine

## 2024-06-27 DIAGNOSIS — J301 Allergic rhinitis due to pollen: Secondary | ICD-10-CM | POA: Diagnosis not present

## 2024-06-27 NOTE — Telephone Encounter (Signed)
 SS appointment 06/29/24 @ [name]

## 2024-06-29 ENCOUNTER — Encounter (INDEPENDENT_AMBULATORY_CARE_PROVIDER_SITE_OTHER): Admitting: Internal Medicine

## 2024-06-29 DIAGNOSIS — G4733 Obstructive sleep apnea (adult) (pediatric): Secondary | ICD-10-CM

## 2024-07-02 DIAGNOSIS — U071 COVID-19: Secondary | ICD-10-CM | POA: Diagnosis not present

## 2024-07-02 DIAGNOSIS — Z20822 Contact with and (suspected) exposure to covid-19: Secondary | ICD-10-CM | POA: Diagnosis not present

## 2024-07-04 DIAGNOSIS — J301 Allergic rhinitis due to pollen: Secondary | ICD-10-CM | POA: Diagnosis not present

## 2024-07-08 ENCOUNTER — Telehealth: Payer: Self-pay | Admitting: Internal Medicine

## 2024-07-08 NOTE — Procedures (Signed)
 [facility]  Polysomnogram Report Part I                                                               Phone: [phone] Fax: [phone]  Patient Name: Sandy Burns, Sandy Burns. Acquisition Number: [id number]  Date of Birth: 2001/04/20 Acquisition Date: 06/29/2024  Referring Physician: Elfreda RONAL Bathe, MD     History: The patient is a 23 year old  who was referred for evaluation of . Medical History: anemia, asthma, delay postpartum hemorrhage, eczema, hypersomnia and enviromental allergies.  Medications: aspirin  81, cetirizine , EPIPEN , ferrous sulfate , fluticasone , fluticasone -salmeterol, montelukast , ondansetron , prenatal vitamin, sertraline  and Ventolin  HFA.  Procedure: This routine overnight polysomnogram was performed on the [facility] 5 using the standard diagnostic protocol. This included 6 channels of EEG, 2 channels of EOG, chin EMG, bilateral anterior tibialis EMG, nasal/oral thermistor, PTAF (nasal pressure transducer), chest and abdominal wall movements, EKG, and pulse oximetry.  Description: The total recording time was 394.6 minutes. The total sleep time was 366.0 minutes. There were a total of 20.9 minutes of wakefulness after sleep onset for agoodsleep efficiency of 92.8%. The latency to sleep onset was shortat 7.7 minutes. The R sleep onset latency was within normal limits at 81.5 minutes. Sleep parameters, as a percentage of the total sleep time, demonstrated 9.2% of sleep was in N1 sleep, 42.5% N2, 22.1% N3 and 26.2% R sleep. There were a total of 103 arousals for an arousal index of 16.9 arousals per hour of sleep that was elevated.  Respiratory monitoring demonstrated   snoring . There were no apneas or hypopneas during the study. The baseline oxygen saturation during wakefulness was 98%, during NREM sleep averaged 98%, and during REM sleep averaged 98%. The total duration of oxygen < 90% was 0.0 minutes.  Cardiac monitoring-  significant cardiac rhythm irregularities.    Periodic limb movement monitoring- demonstrated that there were 4 periodic limb movements for a periodic limb movement index of 0.7 periodic limb movements per hour of sleep.   Impression: This routine overnight polysomnogram did not demonstrate obstructive sleep apnea. There were with no respiratory events observed.  Sleep efficiency was good and sleep progressed normally through all stages. An elevated arousal indexwas observed.      Recommendations: CPAP is not indicated in this case The patient reports a history of hypersomnia with an elevated Epworth Sleepiness Score of 15 out of 24 and daytime napping despite 6-7 hours of sleep per night.   This suggests the possible presence of another sleep disorder of hypersomnia. If there are concerns for unexplained hypersomnia, a repeat polysomnogram followed by a multiple sleep latency test is suggested.  The patient should ensure a minimum of 7-8 hours' sleep per night in the 2 weeks prior to the study and a 2-week sleep diary should be maintained.    Elfreda RONAL Bathe, MD, [facility] Diplomate ABMS-Pulmonary, Critical Care and Sleep Medicine  Electronically reviewed and digitally signed [facility] Polysomnogram Report Part II  Phone: [phone] Fax: [phone]  Patient last name Cuen Neck Size 15.0 in. Acquisition [id number]  Patient first name Sandy Burns. Weight 238.0 lbs. Started 06/29/2024 at 10:17:32 PM  Birth date [birth year] Height 67.0 in. Stopped 06/30/2024 at 5:40:08 AM  Age 57 BMI 37.3 lb./in2  Duration 394.6  Study Type Adult      Report generated by Selinda Capron, RPSGT  Reviewed by: Kathe G. Henke, PhD, ABSM, FAASM Sleep Data: Lights Out: 10:25:50 PM Sleep Onset: 10:33:32 PM  Lights On: 5:00:26 AM Sleep Efficiency: 92.8 %  Total Recording Time: 394.6 Sandy Burns Sleep Latency (from Lights Off) 7.7 Sandy Burns  Total Sleep Time (TST): 366.0 Sandy Burns R Latency (from Sleep Onset): 81.5 Sandy Burns  Sleep Period Time: 386.5 Sandy Burns Total number of  awakenings: 13  Wake during sleep: 20.5 Sandy Burns Wake After Sleep Onset (WASO): 20.9 Sandy Burns   Sleep Data:         Arousal Summary: Stage  Latency from lights out (Sandy Burns) Latency from sleep onset (Sandy Burns) Duration (Sandy Burns) % Total Sleep Time  Normal values  N 1 7.7 0.0 33.5 9.2 (5%)  N 2 9.2 1.5 155.5 42.5 (50%)  N 3 22.7 15.0 81.0 22.1 (20%)  R 89.2 81.5 96.0 26.2 (25%)   Number Index  Spontaneous 75 12.3  Apneas & Hypopneas 0 0.0  RERAs 12 2.0       (Apneas & Hypopneas & RERAs)  (12) (2.0)  Limb Movement 27 4.4  Snore 0 0.0  TOTAL 114 18.7     Respiratory Data:  CA OA MA Apnea Hypopnea* A+ H RERA Total  Number 0 0 0 0 0 0 12 12  Mean Dur (sec) 0.0 0.0 0.0 0.0 0.0 0.0 21.1 21.1  Max Dur (sec) 0.0 0.0 0.0 0.0 0.0 0.0 32.5 32.5  Total Dur (Sandy Burns) 0.0 0.0 0.0 0.0 0.0 0.0 4.2 4.2  % of TST 0.0 0.0 0.0 0.0 0.0 0.0 1.2 1.2  Index (#/h TST) 0.0 0.0 0.0 0.0 0.0 0.0 2.0 2.0  *Hypopneas scored based on 4% or greater desaturation.  Sleep Stage:        REM NREM TST  AHI 0.0 0.0 0.0  RDI 5.6 0.7 2.0           Body Position Data:  Sleep (Sandy Burns) TST (%) REM (Sandy Burns) NREM (Sandy Burns) CA (#) OA (#) MA (#) HYP (#) AHI (#/h) RERA (#) RDI (#/h) Desat (#)  Supine 88.9 24.29 43.0 45.9 0 0 0 0 0.0 4 2.7 9  Non-Supine 277.10 75.71 53.00 224.10 0.00 0.00 0.00 0.00 0.00 8.00 1.73 14.00  Left: 72.1 19.70 21.0 51.1 0 0 0 0 0.0 7 5.8 10  Right: 205.0 56.01 32.0 173.0 0 0 0 0 0.0 1 0.3 4     Snoring: Total number of snoring episodes  0  Total time with snoring    Sandy Burns (   % of sleep)   Oximetry Distribution:             WK REM NREM TOTAL  Average (%)   98 98 98 98  < 90% 0.0 0.0 0.0 0.0  < 80% 0.0 0.0 0.0 0.0  < 70% 0.0 0.0 0.0 0.0  # of Desaturations* 5 10 7 22   Desat Index (#/hour) 12.2 6.3 1.6 3.6  Desat Max (%) 6 4 4 6   Desat Max Dur (sec) 68.0 62.0 45.0 68.0  Approx Sandy Burns O2 during sleep 95  Approx Sandy Burns O2 during a respiratory event 95  Was Oxygen added (Y/N) and final rate :    LPM   *Desaturations based on 3% or greater drop from baseline.   Cheyne Stokes Breathing: None Present   Heart Rate Summary:  Average Heart Rate During Sleep 91.2 bpm      Highest Heart Rate During Sleep (  95th %) 100.0 bpm      Highest Heart Rate During Sleep 159 bpm (artifact)  Highest Heart Rate During Recording (TIB) 185 bpm (artifact)   Heart Rate Observations: Event Type # Events   Bradycardia 0 Lowest HR Scored: N/A  Sinus Tachycardia During Sleep 0 Highest HR Scored: N/A  Narrow Complex Tachycardia 0 Highest HR Scored: N/A  Wide Complex Tachycardia 0 Highest HR Scored: N/A  Asystole 0 Longest Pause: N/A  Atrial Fibrillation 0 Duration Longest Event: N/A  Other Arrythmias   Type:    Periodic Limb Movement Data: (Primary legs unless otherwise noted) Total # Limb Movement 41 Limb Movement Index 6.7  Total # PLMS 4 PLMS Index 0.7  Total # PLMS Arousals 4 PLMS Arousal Index 0.7  Percentage Sleep Time with PLMS 2.46min (0.6 % sleep)  Mean Duration limb movements (secs) 142.0

## 2024-07-08 NOTE — Telephone Encounter (Signed)
 Patient to call me back to schedule appointment with DSK for SS results-[name]

## 2024-07-13 DIAGNOSIS — H60502 Unspecified acute noninfective otitis externa, left ear: Secondary | ICD-10-CM | POA: Diagnosis not present

## 2024-07-13 DIAGNOSIS — H9392 Unspecified disorder of left ear: Secondary | ICD-10-CM | POA: Diagnosis not present

## 2024-07-14 ENCOUNTER — Encounter: Payer: Self-pay | Admitting: Nurse Practitioner

## 2024-07-16 DIAGNOSIS — L723 Sebaceous cyst: Secondary | ICD-10-CM | POA: Diagnosis not present

## 2024-07-16 DIAGNOSIS — J309 Allergic rhinitis, unspecified: Secondary | ICD-10-CM | POA: Diagnosis not present

## 2024-07-18 ENCOUNTER — Encounter: Payer: Self-pay | Admitting: Nurse Practitioner

## 2024-07-18 ENCOUNTER — Ambulatory Visit: Admitting: Nurse Practitioner

## 2024-07-18 VITALS — BP 126/84 | HR 100 | Temp 97.4°F | Resp 16 | Ht 67.0 in | Wt 248.0 lb

## 2024-07-18 DIAGNOSIS — Z6838 Body mass index (BMI) 38.0-38.9, adult: Secondary | ICD-10-CM | POA: Diagnosis not present

## 2024-07-18 DIAGNOSIS — E559 Vitamin D deficiency, unspecified: Secondary | ICD-10-CM | POA: Diagnosis not present

## 2024-07-18 DIAGNOSIS — J301 Allergic rhinitis due to pollen: Secondary | ICD-10-CM | POA: Diagnosis not present

## 2024-07-18 DIAGNOSIS — E66812 Obesity, class 2: Secondary | ICD-10-CM | POA: Diagnosis not present

## 2024-07-18 DIAGNOSIS — R7303 Prediabetes: Secondary | ICD-10-CM

## 2024-07-18 MED ORDER — PHENTERMINE HCL 37.5 MG PO TABS: 37.5000 mg | ORAL_TABLET | Freq: Every day | ORAL | 1 refills | Status: AC

## 2024-07-18 NOTE — Progress Notes (Signed)
 [facility] [address] [place], [place] [address]  Internal MEDICINE  Office Visit Note  Patient Name: Sandy Burns  [id number]  [id number]  Date of Service: 07/18/2024  Chief Complaint  Patient presents with   Follow-up    HPI Sandy Burns presents for a follow-up visit for weight loss, low vitamin D  and prediabetes.  Weight loss -- wegovy  is no longer covered by her insurance. Her insurance will cover phentermine , diethylpropion or phendimetrazine. She is interested in trying phentermine  again since it worked for her before.  Vitamin D  deficiency -- last vitamin D  level was 21.0, she is taking a weekly supplement.  Prediabetes -- not currently on any medications, weight loss will help improve her A1c.     Current Medication: Outpatient Encounter Medications as of 07/18/2024  Medication Sig   phentermine  (ADIPEX-P ) 37.5 MG tablet Take 1 tablet (37.5 mg total) by mouth daily before breakfast. May take 1/2 tablet daily for the first week and then increase to the full tablet.   ADVAIR  DISKUS 100-50 MCG/ACT AEPB INHALE 1 PUFF INTO THE LUNGS TWICE DAILY   albuterol  (VENTOLIN  HFA) 108 (90 Base) MCG/ACT inhaler INHALE 2 PUFFS INTO THE LUNGS EVERY 4 HOURS AS NEEDED FOR WHEEZING OR SHORTNESS OF BREATH   Azelaic Acid  15 % gel After skin is thoroughly washed and patted dry, gently but thoroughly massage a thin film of azelaic acid  cream into the affected area twice daily, in the morning and evening.   BENZACLIN  gel Apply to face daily   cetirizine  (ZYRTEC ) 10 MG tablet Take 10 mg by mouth daily as needed.   EPIPEN  2-PAK 0.3 MG/0.3ML SOAJ injection Inject 0.3 mg into the muscle as needed for anaphylaxis.   fluticasone  (FLONASE ) 50 MCG/ACT nasal spray SHAKE LIQUID AND USE 1 SPRAY IN EACH NOSTRIL DAILY   ibuprofen  (ADVIL ) 600 MG tablet Take 1 tablet (600 mg total) by mouth every 6 (six) hours.   ketoconazole  (NIZORAL ) 2 % shampoo apply 1 time per week, massage into scalp and leave in  for 5 minutes before rinsing out   labetalol  (NORMODYNE ) 100 MG tablet Take 1 tablet (100 mg total) by mouth 2 (two) times daily.   levonorgestrel  (MIRENA ) 20 MCG/DAY IUD 1 each by Intrauterine route once.   Ruxolitinib Phosphate  (OPZELURA ) 1.5 % CREA APPLY TOPICALLY TO LEGS ONCE DAILY   valACYclovir  (VALTREX ) 500 MG tablet Take 1 tablet (500 mg total) by mouth daily. Can increase to twice a day for 5 days in the event of a recurrence   Vitamin D , Ergocalciferol , (DRISDOL ) 1.25 MG (50000 UNIT) CAPS capsule Take 1 capsule (50,000 Units total) by mouth every 7 (seven) days.   [DISCONTINUED] escitalopram (LEXAPRO) 10 MG tablet Take 10 mg by mouth daily.   [DISCONTINUED] montelukast  (SINGULAIR ) 10 MG tablet TAKE 1 TABLET(10 MG) BY MOUTH AT BEDTIME   [DISCONTINUED] prenatal vitamin w/FE, FA (NATACHEW) 29-1 MG CHEW chewable tablet Chew 2 tablets by mouth daily at 12 noon.   [DISCONTINUED] semaglutide -weight management (WEGOVY ) 0.5 MG/0.5ML SOAJ SQ injection Inject 0.5 mg into the skin once a week.   [DISCONTINUED] Vitamin D , Ergocalciferol , (DRISDOL ) 1.25 MG (50000 UNIT) CAPS capsule Take 1 capsule (50,000 Units total) by mouth every 7 (seven) days.   No facility-administered encounter medications on file as of 07/18/2024.    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    Vaginal vault hematoma 09/07/2020    Family History: Family  History  Problem Relation Age of Onset   Rheum arthritis Mother    Hypertension Mother    Lupus Mother    Schizophrenia Father    Healthy Sister    Healthy Sister    Healthy Brother    Healthy Brother    Asthma Maternal Grandmother    Hypertension Maternal Grandmother    Glaucoma Maternal Grandmother    Carpal tunnel syndrome Maternal Grandmother    Healthy Maternal Grandfather     Social History   Socioeconomic History   Marital status: Single    Spouse name: Not on file   Number of children: 2   Years of education: 14   Highest education level: Not on file  Occupational History   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    Comment: Mirena  01/18/24  Other Topics Concern   Not on file  Social History Narrative   Not on file   Social Drivers of Health   Financial Resource Strain: Medium Risk (05/05/2023)   Overall Financial Resource Strain (CARDIA)    Difficulty of Paying Living Expenses: Somewhat hard  Food Insecurity: No Food Insecurity (11/30/2023)   Hunger Vital Sign    Worried About Running Out of Food in the Last Year: Never true    Ran Out of Food in the Last Year: Never true  Transportation Needs: No Transportation Needs (11/30/2023)   PRAPARE - Administrator, Civil Service (Medical): No    Lack of Transportation (Non-Medical): No  Physical Activity: Insufficiently Active (05/05/2023)   Exercise Vital Sign    Days of Exercise per Week: 1 day    Minutes of Exercise per Session: 40 min  Stress: No Stress Concern Present (05/05/2023)   [employer] of Occupational Health - Occupational Stress Questionnaire    Feeling of Stress : Not at all  Social  Connections: Unknown (05/05/2023)   Social Connection and Isolation Panel    Frequency of Communication with Friends and Family: Once a week    Frequency of Social Gatherings with Friends and Family: Three times a week    Attends Religious Services: Never    Active Member of Clubs or Organizations: No    Attends Banker Meetings: Never    Marital Status: Not on file  Intimate Partner Violence: Not At Risk (11/30/2023)   Humiliation, Afraid, Rape, and Kick questionnaire    Fear of Current or Ex-Partner: No    Emotionally Abused: No    Physically Abused: No    Sexually Abused: No  Review of Systems  Constitutional:  Positive for appetite change and unexpected weight change. Negative for chills, fatigue and fever.  HENT:  Positive for postnasal drip. Negative for congestion, rhinorrhea, sinus pressure, sinus pain, sneezing and sore throat.   Respiratory:  Positive for shortness of breath (intermittent). Negative for cough, chest tightness and wheezing.   Cardiovascular: Negative.  Negative for chest pain and palpitations.  Gastrointestinal: Negative.  Negative for abdominal pain, constipation, diarrhea, nausea and vomiting.  Musculoskeletal:  Negative for arthralgias.  Psychiatric/Behavioral:  Positive for behavioral problems.     Vital Signs: BP 126/84   Pulse 100   Temp (!) 97.4 F (36.3 C)   Resp 16   Ht 5' 7 (1.702 m)   Wt 248 lb (112.5 kg)   SpO2 97%   BMI 38.84 kg/m    Physical Exam Vitals reviewed.  Constitutional:      General: She is not in acute distress.    Appearance: Normal appearance. She is obese. She is not ill-appearing.  HENT:     Head: Normocephalic and atraumatic.  Eyes:     Pupils: Pupils are equal, round, and reactive to light.  Cardiovascular:     Rate and Rhythm: Normal rate and regular rhythm.  Pulmonary:     Effort: Pulmonary effort is normal. No respiratory distress.  Skin:    Capillary Refill: Capillary refill takes less  than 2 seconds.  Neurological:     Mental Status: She is alert and oriented to person, place, and time.  Psychiatric:        Mood and Affect: Mood is depressed.        Speech: Speech is delayed.        Behavior: Behavior is slowed. Behavior is cooperative.        Thought Content: Thought content is not paranoid or delusional. Thought content does not include homicidal or suicidal ideation.        Assessment/Plan: 1. Prediabetes (Primary) Continue working on weight loss which will help improve her A1c as well.   2. Vitamin D  deficiency Continue weekly supplement as prescribed.  - Vitamin D , Ergocalciferol , (DRISDOL ) 1.25 MG (50000 UNIT) CAPS capsule; Take 1 capsule (50,000 Units total) by mouth every 7 (seven) days.  Dispense: 12 capsule; Refill: 1  3. Class 2 severe obesity due to excess calories with serious comorbidity and body mass index (BMI) of 38.0 to 38.9 in adult Start phentermine  as prescribed, follow up in 8 weeks.  - phentermine  (ADIPEX-P ) 37.5 MG tablet; Take 1 tablet (37.5 mg total) by mouth daily before breakfast. May take 1/2 tablet daily for the first week and then increase to the full tablet.  Dispense: 30 tablet; Refill: 1   General Counseling: Sandy Burns verbalizes understanding of the findings of todays visit and agrees with plan of treatment. I have discussed any further diagnostic evaluation that may be needed or ordered today. We also reviewed her medications today. she has been encouraged to call the office with any questions or concerns that should arise related to todays visit.    No orders of the defined types were placed in this encounter.   Meds ordered this encounter  Medications   phentermine  (ADIPEX-P ) 37.5 MG tablet    Sig: Take 1 tablet (37.5 mg total) by mouth daily before breakfast. May take 1/2 tablet daily for the first week and then increase to the full tablet.    Dispense:  30 tablet    Refill:  1    Fill new  script today, discontinue  wegovy .   Vitamin D , Ergocalciferol , (DRISDOL ) 1.25 MG (50000 UNIT) CAPS capsule    Sig: Take 1 capsule (50,000 Units total) by mouth every 7 (seven) days.    Dispense:  12 capsule    Refill:  1    Return in about 8 weeks (around 09/12/2024) for F/U, eval new med, Tharun Cappella PCP.   Total time spent:30 Minutes Time spent includes review of chart, medications, test results, and follow up plan with the patient.   [place] Controlled Substance Database was reviewed by me.  This patient was seen by Mardy Maxin, FNP-C in collaboration with Dr. Sigrid Bathe as a part of collaborative care agreement.   Jeneane Pieczynski R. Maxin, MSN, FNP-C Internal medicine

## 2024-07-19 ENCOUNTER — Encounter: Payer: Self-pay | Admitting: Nurse Practitioner

## 2024-07-19 MED ORDER — VITAMIN D (ERGOCALCIFEROL) 1.25 MG (50000 UNIT) PO CAPS: 50000.0000 [IU] | ORAL_CAPSULE | ORAL | 1 refills | Status: AC

## 2024-07-25 DIAGNOSIS — J301 Allergic rhinitis due to pollen: Secondary | ICD-10-CM | POA: Diagnosis not present

## 2024-08-04 DIAGNOSIS — J302 Other seasonal allergic rhinitis: Secondary | ICD-10-CM | POA: Diagnosis not present

## 2024-08-05 DIAGNOSIS — F411 Generalized anxiety disorder: Secondary | ICD-10-CM | POA: Diagnosis not present

## 2024-08-05 DIAGNOSIS — F332 Major depressive disorder, recurrent severe without psychotic features: Secondary | ICD-10-CM | POA: Diagnosis not present

## 2024-08-06 DIAGNOSIS — J309 Allergic rhinitis, unspecified: Secondary | ICD-10-CM | POA: Diagnosis not present

## 2024-08-10 ENCOUNTER — Encounter: Payer: Self-pay | Admitting: Nurse Practitioner

## 2024-08-13 ENCOUNTER — Ambulatory Visit: Admitting: Dermatology

## 2024-08-13 DIAGNOSIS — L2081 Atopic neurodermatitis: Secondary | ICD-10-CM

## 2024-08-13 DIAGNOSIS — L219 Seborrheic dermatitis, unspecified: Secondary | ICD-10-CM | POA: Diagnosis not present

## 2024-08-13 DIAGNOSIS — Z79899 Other long term (current) drug therapy: Secondary | ICD-10-CM

## 2024-08-13 DIAGNOSIS — Z7189 Other specified counseling: Secondary | ICD-10-CM

## 2024-08-13 DIAGNOSIS — L700 Acne vulgaris: Secondary | ICD-10-CM

## 2024-08-13 MED ORDER — FLUOCINOLONE ACETONIDE 0.01 % OT OIL: TOPICAL_OIL | OTIC | 11 refills | Status: AC

## 2024-08-13 MED ORDER — ADAPALENE 0.3 % EX GEL: CUTANEOUS | 11 refills | Status: AC

## 2024-08-13 MED ORDER — ANZUPGO 20 MG/GM EX CREA: 1.0000 | TOPICAL_CREAM | Freq: Every day | CUTANEOUS | 11 refills | Status: AC

## 2024-08-13 NOTE — Progress Notes (Signed)
 Follow-Up Visit   Subjective  Sandy Burns is a 23 y.o. female who presents for the following: acne, seborrheic dermatitis and atopic dermatitis Patient reports she is still flared at face for acne, flared at hands for atopic dermatitis and flared at scalp for seborrheic dermatitis  Pt is not pregnant and she is not planning to get pregnant and she is not breast feeding.  The following portions of the chart were reviewed this encounter and updated as appropriate: medications, allergies, medical history  Review of Systems:  No other skin or systemic complaints except as noted in HPI or Assessment and Plan.  Objective  Well appearing patient in no apparent distress; mood and affect are within normal limits.  A focused examination was performed of the following areas: Face, hands, scalp  Relevant exam findings are noted in the Assessment and Plan.   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 Hands with scale. See photos 14% BSA Chronic and persistent condition with duration or expected duration over one year. Condition is bothersome/symptomatic for patient. Currently flared.  Atopic dermatitis (eczema) is a chronic, relapsing, pruritic condition that can significantly affect quality of life. It is often associated with allergic rhinitis and/or asthma and can require treatment with topical medications, phototherapy, or in severe cases biologic injectable medication (Dupixent; Adbry) or Oral JAK inhibitors.    Treatment Plan: Start Anzupgo 2 % cream - apply to affected areas daily  Sample given  [facility] [address]222-280-91 Lot [id number]  Exp 03/2027  Rx sent to [facility] in [facility]   Continue Eucrisa  ointment daily     Recommend gentle skin care.  SEBORRHEIC DERMATITIS Exam: Pink patches with greasy scale at scalp Chronic and persistent condition with duration or expected duration over one year. Condition is  bothersome/symptomatic for patient. Currently flared. Seborrheic Dermatitis is a chronic persistent rash characterized by pinkness and scaling most commonly of the mid face but also can occur on the scalp (dandruff), ears; mid chest, mid back and groin.  It tends to be exacerbated by stress and cooler weather.  People who have neurologic disease may experience new onset or exacerbation of existing seborrheic dermatitis.  The condition is not curable but treatable and can be controlled. Treatment Plan: Start Fluocinolone Acetonide 0.01 % oil - apply topically to itch and scaly spots on scalp twice weekly.  Continue ketoconazole  shampoo 2 % once weekly apply  times per week, massage into scalp and leave in for 5 minutes before rinsing out  ACNE VULGARIS Exam:  fine comedones of the face  Chronic and persistent condition with duration or expected duration over one year. Condition is symptomatic/ bothersome to patient. Not currently at goal. Treatment Plan: Start adapalene 0.3 % gel - apply pea sized amount to face  Continue Benzaclin  gel - apply to face in am  Topical retinoid medications like tretinoin /Retin-A , adapalene/Differin, tazarotene/Fabior, and Epiduo/Epiduo Forte can cause dryness and irritation when first started. Only apply a pea-sized amount to the entire affected area. Avoid applying it around the eyes, edges of mouth and creases at the nose. If you experience irritation, use a good moisturizer first and/or apply the medicine less often. If you are doing well with the medicine, you can increase how often you use it until you are applying every night. Be careful with sun protection while using this medication as it can make you sensitive to the sun. This medicine should not be used by pregnant  women.    Pt is not pregnant and she is not planning to get pregnant and she is not breast feeding.   ACNE VULGARIS   Related Medications Azelaic Acid  15 % gel After skin is thoroughly  washed and patted dry, gently but thoroughly massage a thin film of azelaic acid  cream into the affected area twice daily, in the morning and evening. BENZACLIN  gel Apply to face daily Adapalene 0.3 % gel Apply pea sized amount to face nightly as tolerated SEBORRHEIC DERMATITIS   Related Medications ketoconazole  (NIZORAL ) 2 % shampoo apply 1 time per week, massage into scalp and leave in for 5 minutes before rinsing out Fluocinolone Acetonide 0.01 % OIL Apply to itchy scale at scalp twice weekly for seborrheic dermatitis ATOPIC NEURODERMATITIS   Related Medications Ruxolitinib Phosphate  (OPZELURA ) 1.5 % CREA APPLY TOPICALLY TO LEGS ONCE DAILY Delgocitinib (ANZUPGO) 20 MG/GM CREA Apply 1 Application topically daily. COUNSELING AND COORDINATION OF CARE   MEDICATION MANAGEMENT    Return in about 6 months (around 02/10/2025) for acne seb derm atopic derm.  IEleanor Blush, CMA, am acting as scribe for Alm Rhyme, MD.  Documentation: I have reviewed the above documentation for accuracy and completeness, and I agree with the above.  Alm Rhyme, MD

## 2024-08-13 NOTE — Patient Instructions (Signed)

## 2024-08-15 DIAGNOSIS — J301 Allergic rhinitis due to pollen: Secondary | ICD-10-CM | POA: Diagnosis not present

## 2024-08-16 NOTE — Progress Notes (Deleted)
 Sleep Medicine   Office Visit  Patient Name: Sandy Burns DOB: Feb 18, 2001 MRN [id number]    Chief Complaint: ***  Brief History:  Sandy Burns presents for a follow up visit to discuss the results of her recent sleep study. Patient has a *** history of ***. Sleep quality is ***. This is noted *** nights. The patient's bed partner reports  *** at night. The patient relates the following symptoms: *** are also present. The patient goes to sleep at *** and wakes up at ***.  Sleep quality is *** when outside home environment.  Patient has noted *** of her legs at night that would disrupt her sleep.  The patient  relates *** as unusual behavior during the night.  The patient relates *** as a history of psychiatric problems. The Epworth Sleepiness Score is *** out of 24 .  The patient relates  Cardiovascular risk factors include: *** The patient reports ***    ROS  General: (-) fever, (-) chills, (-) night sweat Nose and Sinuses: (-) nasal stuffiness or itchiness, (-) postnasal drip, (-) nosebleeds, (-) sinus trouble. Mouth and Throat: (-) sore throat, (-) hoarseness. Neck: (-) swollen glands, (-) enlarged thyroid , (-) neck pain. Respiratory: *** cough, *** shortness of breath, *** wheezing. Neurologic: *** numbness, *** tingling. Psychiatric: *** anxiety, *** depression Sleep behavior: ***sleep paralysis ***hypnogogic hallucinations ***dream enactment      ***vivid dreams ***cataplexy ***night terrors ***sleep walking   Current Medication: Outpatient Encounter Medications as of 08/19/2024  Medication Sig   Adapalene 0.3 % gel Apply pea sized amount to face nightly as tolerated   ADVAIR  DISKUS 100-50 MCG/ACT AEPB INHALE 1 PUFF INTO THE LUNGS TWICE DAILY   albuterol  (VENTOLIN  HFA) 108 (90 Base) MCG/ACT inhaler INHALE 2 PUFFS INTO THE LUNGS EVERY 4 HOURS AS NEEDED FOR WHEEZING OR SHORTNESS OF BREATH   Azelaic Acid  15 % gel After skin is thoroughly washed and patted dry, gently but thoroughly  massage a thin film of azelaic acid  cream into the affected area twice daily, in the morning and evening.   BENZACLIN  gel Apply to face daily   cetirizine  (ZYRTEC ) 10 MG tablet Take 10 mg by mouth daily as needed.   Delgocitinib (ANZUPGO) 20 MG/GM CREA Apply 1 Application topically daily.   EPIPEN  2-PAK 0.3 MG/0.3ML SOAJ injection Inject 0.3 mg into the muscle as needed for anaphylaxis.   Fluocinolone Acetonide 0.01 % OIL Apply to itchy scale at scalp twice weekly for seborrheic dermatitis   fluticasone  (FLONASE ) 50 MCG/ACT nasal spray SHAKE LIQUID AND USE 1 SPRAY IN EACH NOSTRIL DAILY   ibuprofen  (ADVIL ) 600 MG tablet Take 1 tablet (600 mg total) by mouth every 6 (six) hours.   ketoconazole  (NIZORAL ) 2 % shampoo apply 1 time per week, massage into scalp and leave in for 5 minutes before rinsing out   labetalol  (NORMODYNE ) 100 MG tablet Take 1 tablet (100 mg total) by mouth 2 (two) times daily.   levonorgestrel  (MIRENA ) 20 MCG/DAY IUD 1 each by Intrauterine route once.   phentermine  (ADIPEX-P ) 37.5 MG tablet Take 1 tablet (37.5 mg total) by mouth daily before breakfast. May take 1/2 tablet daily for the first week and then increase to the full tablet.   Ruxolitinib Phosphate  (OPZELURA ) 1.5 % CREA APPLY TOPICALLY TO LEGS ONCE DAILY   valACYclovir  (VALTREX ) 500 MG tablet Take 1 tablet (500 mg total) by mouth daily. Can increase to twice a day for 5 days in the event of a recurrence   Vitamin D ,  Ergocalciferol , (DRISDOL ) 1.25 MG (50000 UNIT) CAPS capsule Take 1 capsule (50,000 Units total) by mouth every 7 (seven) days.   No facility-administered encounter medications on file as of 08/19/2024.    Surgical History: Past Surgical History:  Procedure Laterality Date   FOOT SURGERY  2015   REPAIR VAGINAL CUFF N/A 09/06/2020   Procedure: REPAIR VAGINAL CUFF;  Surgeon: Connell Davies, MD;  Location: [facility] ORS;  Service: Gynecology;  Laterality: N/A;   TOOTH EXTRACTION  2015   WISDOM TOOTH EXTRACTION      four; age 23    Medical History: Past Medical History:  Diagnosis Date   Anemia    Asthma    Delay 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/07/2020   Eczema    Environmental allergies    History of blood transfusion    after giving birth 09/06/2020   HSV-1 (herpes simplex virus 1) infection    HSV-2 infection    Vaginal vault hematoma 09/07/2020    Family History: Non contributory to the present illness  Social History: Social History   Socioeconomic History   Marital status: Single    Spouse name: Not on file   Number of children: 2   Years of education: 14   Highest education level: Not on file  Occupational History   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    Comment: Mirena  01/18/24  Other Topics Concern   Not on file  Social History Narrative   Not on file   Social Drivers of Health   Financial Resource Strain: Medium Risk (05/05/2023)   Overall Financial Resource Strain (CARDIA)    Difficulty of Paying Living Expenses: Somewhat hard  Food Insecurity: No Food Insecurity (11/30/2023)   Hunger Vital Sign    Worried About Running Out of Food in the Last Year: Never true    Ran Out of Food in the Last Year: Never true  Transportation Needs: No Transportation Needs (11/30/2023)   PRAPARE - Administrator, Civil Service (Medical): No    Lack of Transportation (Non-Medical): No  Physical Activity: Insufficiently Active (05/05/2023)   Exercise Vital Sign    Days of Exercise per Week: 1 day    Minutes of Exercise per Session: 40 min  Stress: No Stress Concern Present (05/05/2023)   [employer] of Occupational Health - Occupational Stress Questionnaire    Feeling  of Stress : Not at all  Social Connections: Unknown (05/05/2023)   Social Connection and Isolation Panel    Frequency of Communication with Friends and Family: Once a week    Frequency of Social Gatherings with Friends and Family: Three times a week    Attends Religious Services: Never    Active Member of Clubs or Organizations: No    Attends Banker Meetings: Never    Marital Status: Not on file  Intimate Partner Violence: Not At Risk (11/30/2023)   Humiliation, Afraid, Rape, and Kick questionnaire    Fear of Current or Ex-Partner: No    Emotionally Abused: No    Physically Abused: No    Sexually Abused: No    Vital Signs: not currently breastfeeding. There is no height  or weight on file to calculate BMI.   Examination: General Appearance: The patient is well-developed, well-nourished, and in no distress. Neck Circumference: *** Skin: Gross inspection of skin unremarkable. Head: normocephalic, no gross deformities. Eyes: no gross deformities noted. ENT: ears appear grossly normal Neurologic: Alert and oriented. No involuntary movements.    STOP BANG RISK ASSESSMENT S (snore) Have you been told that you snore?     YES/N   T (tired) Are you often tired, fatigued, or sleepy during the day?   YES/NO  O (obstruction) Do you stop breathing, choke, or gasp during sleep? YES/NO   P (pressure) Do you have or are you being treated for high blood pressure? YES/NO   B (BMI) Is your body index greater than 35 kg/m? YES/NO   A (age) Are you 33 years old or older? NO   N (neck) Do you have a neck circumference greater than 16 inches?   YES/NO   G (gender) Are you a female? NO   TOTAL STOP/BANG "YES" ANSWERS                                                                A STOP-Bang score of 2 or less is considered low risk, and a score of 5 or more is high risk for having either moderate or severe OSA. For people who score 3 or 4, doctors may need to perform further  assessment to determine how likely they are to have OSA.         EPWORTH SLEEPINESS SCALE:  Scale:  (0)= no chance of dozing; (1)= slight chance of dozing; (2)= moderate chance of dozing; (3)= high chance of dozing  Chance  Situtation    Sitting and reading: ***    Watching TV: ***    Sitting Inactive in public: ***    As a passenger in car: ***      Lying down to rest: ***    Sitting and talking: ***    Sitting quielty after lunch: ***    In a car, stopped in traffic: ***   TOTAL SCORE:   *** out of 24    SLEEP STUDIES:  PSG (06/2024) CPAP not indicated. May suggest presence of another sleep disorder of hypersomnia. If there are concerns for unexplained hypersomnia, would recommend repeat PSG followed by MSLT   LABS: No results found for this or any previous visit (from the past 2160 hours).  Radiology: US  SOFT TISSUE LOWER EXTREMITY LIMITED RIGHT (NON-VASCULAR) Result Date: 01/02/2024 CLINICAL DATA:  Lump in lower right leg near the base of the knee EXAM: ULTRASOUND right LOWER EXTREMITY LIMITED TECHNIQUE: Ultrasound examination of the lower extremity soft tissues was performed in the area of clinical concern. COMPARISON:  None Available. FINDINGS: Joint Space: No effusion. Muscles: Normal. Tendons: Normal Other Soft Tissue Structures: Palpable abnormality correlates with a hypoechoic nodule in the subcutaneous tissues measuring 1.1 x 0.8 x 0.6 cm. There is not vascular, not connected to any vascular structure. Significance unclear may correlate with a soft tissue mass. Nonspecific characteristics. Correlate clinically. Perhaps a if clinically indicated a ultrasound-guided aspiration-biopsy recommended as clinically needed. IMPRESSION: Palpable abnormality correlates with a hypoechoic nodule in the subcutaneous tissues measuring 1.1 x 0.8 x 0.6 cm. There is not vascular, not  connected to any vascular structure. Significance unclear may correlate with a soft tissue mass.  Nonspecific characteristics. Perhaps a if clinically indicated a ultrasound-guided aspiration-biopsy recommended as clinically needed. Electronically Signed   By: Franky Chard M.D.   On: 01/02/2024 09:25    No results found.  No results found.    Assessment and Plan: Patient Active Problem List   Diagnosis Date Noted   Vitamin D  deficiency 06/17/2024   Prediabetes 01/20/2024   Encounter for induction of labor 12/03/2023   Postpartum care following vaginal delivery 12/03/2023   Encounter for care or examination of lactating mother 12/03/2023   Hx of pre-eclampsia in prior pregnancy, currently pregnant 11/26/2023   Hx of postpartum hemorrhage, currently pregnant 11/26/2023   Supervision of other normal pregnancy, antepartum 05/05/2023   Obesity in pregnancy 09/07/2020     PLAN OSA:   Patient evaluation suggests high risk of sleep disordered breathing due to *** Patient has comorbid cardiovascular risk factors including: *** which could be exacerbated by pathologic sleep-disordered breathing.  Suggest: *** to assess/treat the patient's sleep disordered breathing. The patient was also counselled on *** to optimize sleep health.  PLAN hypersomnia:  Patient evaluation suggests significant daytime hypersomnia.  The Epworth Sleepiness Score is elevated at *** out of 24. Patient *** drowsy driving. The patient *** MVA due to sleepiness.  The patient *** restless leg symptoms which exacerbate *** for *** nights per week. The patient *** periodic limb movements which exacerbate ***  for *** nights per week. Suggest: ***  Also suggest ***  PLAN insomnia:  Patient evaluation suggests *** insomnia. This is a chronic disorder. This has been a concern for *** and causes impaired daytime functioning. The patient exhibits comorbid ***  The history *** suggest the insomnia predates the use of hypnotic medications. The symptoms *** with the discontinuation of these medications. There is no obvious  medical, psychiatric or pharmacologic abuse issues ot account for the insomnia.  Treatment recommendations include: *** The patient should maintain a sleep log and calculate total sleep time for 1-2 weeks. Set bed and wake times for achieve 85% sleep efficiency for one week. Once this is achieved  time in bed can be gradually increased. A pharmacologic treatment approach would include a trial of *** for the next ***  months. During this time the patient is to maintain a sleep diary to track progress.    ***  General Counseling: I have discussed the findings of the evaluation and examination with Nicol.  I have also discussed any further diagnostic evaluation thatmay be needed or ordered today. Quin verbalizes understanding of the findings of todays visit. We also reviewed her medications today and discussed drug interactions and side effects including but not limited excessive drowsiness and altered mental states. We also discussed that there is always a risk not just to her but also people around her. she has been encouraged to call the office with any questions or concerns that should arise related to todays visit.  No orders of the defined types were placed in this encounter.       I have personally obtained a history, evaluated the patient, evaluated pertinent data, formulated the assessment and plan and placed orders.    Elfreda DELENA Bathe, MD [facility] Diplomate ABMS Pulmonary and Critical Care Medicine Sleep medicine

## 2024-08-17 DIAGNOSIS — J069 Acute upper respiratory infection, unspecified: Secondary | ICD-10-CM | POA: Diagnosis not present

## 2024-08-19 ENCOUNTER — Ambulatory Visit

## 2024-08-19 ENCOUNTER — Encounter: Payer: Self-pay | Admitting: Dermatology

## 2024-08-22 DIAGNOSIS — J301 Allergic rhinitis due to pollen: Secondary | ICD-10-CM | POA: Diagnosis not present

## 2024-08-29 DIAGNOSIS — R070 Pain in throat: Secondary | ICD-10-CM | POA: Diagnosis not present

## 2024-08-29 DIAGNOSIS — J018 Other acute sinusitis: Secondary | ICD-10-CM | POA: Diagnosis not present

## 2024-08-29 DIAGNOSIS — J029 Acute pharyngitis, unspecified: Secondary | ICD-10-CM | POA: Diagnosis not present

## 2024-09-11 ENCOUNTER — Encounter: Payer: Self-pay | Admitting: Nurse Practitioner

## 2024-09-11 DIAGNOSIS — J301 Allergic rhinitis due to pollen: Secondary | ICD-10-CM | POA: Diagnosis not present

## 2024-09-11 DIAGNOSIS — R070 Pain in throat: Secondary | ICD-10-CM | POA: Diagnosis not present

## 2024-09-11 DIAGNOSIS — R519 Headache, unspecified: Secondary | ICD-10-CM | POA: Diagnosis not present

## 2024-09-11 DIAGNOSIS — J069 Acute upper respiratory infection, unspecified: Secondary | ICD-10-CM | POA: Diagnosis not present

## 2024-09-16 ENCOUNTER — Ambulatory Visit: Admitting: Nurse Practitioner

## 2024-09-16 ENCOUNTER — Emergency Department
Admission: EM | Admit: 2024-09-16 | Discharge: 2024-09-16 | Disposition: A | Discharge status: Home / Self Care | Attending: Emergency Medicine | Admitting: Emergency Medicine

## 2024-09-16 ENCOUNTER — Other Ambulatory Visit: Payer: Self-pay

## 2024-09-16 DIAGNOSIS — B974 Respiratory syncytial virus as the cause of diseases classified elsewhere: Secondary | ICD-10-CM | POA: Diagnosis not present

## 2024-09-16 DIAGNOSIS — B338 Other specified viral diseases: Secondary | ICD-10-CM

## 2024-09-16 DIAGNOSIS — R059 Cough, unspecified: Secondary | ICD-10-CM | POA: Diagnosis not present

## 2024-09-16 DIAGNOSIS — R509 Fever, unspecified: Secondary | ICD-10-CM | POA: Diagnosis not present

## 2024-09-16 LAB — RESP PANEL BY RT-PCR (RSV, FLU A&B, COVID)  RVPGX2
Influenza A by PCR: NEGATIVE
Influenza B by PCR: NEGATIVE
Resp Syncytial Virus by PCR: POSITIVE — AB
SARS Coronavirus 2 by RT PCR: NEGATIVE

## 2024-09-16 MED ORDER — IBUPROFEN 200 MG PO TABS: 600.0000 mg | ORAL_TABLET | Freq: Three times a day (TID) | ORAL | 0 refills | Status: AC | PRN

## 2024-09-16 MED ORDER — GUAIFENESIN ER 600 MG PO TB12: 600.0000 mg | ORAL_TABLET | Freq: Two times a day (BID) | ORAL | 0 refills | Status: AC

## 2024-09-16 MED ORDER — ACETAMINOPHEN 500 MG PO TABS: 1000.0000 mg | ORAL_TABLET | Freq: Three times a day (TID) | ORAL | 0 refills | Status: AC | PRN

## 2024-09-16 NOTE — Discharge Instructions (Addendum)
 You have been seen in the Emergency Department (ED) today for RSV.  Please drink plenty of clear fluids (water, Gatorade, chicken broth, etc).  You may use Tylenol  and/or Motrin  according to label instructions.  You can alternate between the two without any side effects. I have also prescribed Mucinex .   I unfortunately cannot refill your Advair  due to you having refills listed and available from your PCP. Call the [facility] and ask for them to fill one of your available refills.   Please follow up with your doctor as listed above.  Call your doctor or return to the Emergency Department (ED) if you are unable to tolerate fluids due to vomiting, have worsening trouble breathing, become extremely tired or difficult to awaken, or if you develop any other symptoms that concern you.   Hope you feel better soon!

## 2024-09-16 NOTE — ED Notes (Signed)
 See triage note  Presents with cough   States she started feeling bad a few days ago

## 2024-09-16 NOTE — ED Triage Notes (Signed)
 Pt to ED via POV from home. Pt reports cough, congestion and decrease intake for the last few days.

## 2024-09-16 NOTE — ED Provider Notes (Signed)
 [facility] Provider Note    Event Date/Time   First MD Initiated Contact with Patient 09/16/24 1820     (approximate)   History   Cough   HPI  Sandy Burns is a 23 y.o. female  with a past medical history of anemia, HSV1 and 2, eczema, asthma presents to the emergency department with cough, nasal congestion and reported decreased intake according to triage note for the last 5 days.  Patient denies chest pain, shortness of breath, headache, fever, chills, sore throat, abdominal pain, N/V/D, otalgia.  Her daughter is sick as well.  She has been in contact with her daughter's daycare with multiple children testing positive for RSV.  She has been using Mucinex  and Tylenol  at home for pain.   Physical Exam   Triage Vital Signs: ED Triage Vitals  Encounter Vitals Group     BP 09/16/24 1622 130/72     Girls Systolic BP Percentile --      Girls Diastolic BP Percentile --      Boys Systolic BP Percentile --      Boys Diastolic BP Percentile --      Pulse Rate 09/16/24 1621 100     Resp 09/16/24 1621 20     Temp 09/16/24 1620 98.1 F (36.7 C)     Temp Source 09/16/24 1620 Oral     SpO2 09/16/24 1621 98 %     Weight 09/16/24 1813 248 lb 0.3 oz (112.5 kg)     Height 09/16/24 1813 5' 7 (1.702 m)     Head Circumference --      Peak Flow --      Pain Score 09/16/24 1620 0     Pain Loc --      Pain Education --      Exclude from Growth Chart --     Most recent vital signs: Vitals:   09/16/24 1622 09/16/24 2052  BP: 130/72 139/75  Pulse:  90  Resp:  18  Temp:    SpO2:  99%    General: Awake, in no acute distress. Appears stated age. Eyes: No scleral icterus or conjunctival injection. Ears/Nose/Throat: TMs intact b/l, no erythema or exudate. Nares patent, no nasal discharge. Oropharynx moist, no erythema or exudate. Dentition intact. Neck: Supple, no lymphadenopathy. CV: Good peripheral perfusion. RRR. Respiratory:Normal respiratory effort.  No  respiratory distress. CTAB. GI: Soft, non-distended, non-tender.  Skin:Warm, dry, intact. No rashes, lesions, or ecchymosis. No cyanosis or pallor. Neurological: A&Ox4 to person, place, time, and situation.   ED Results / Procedures / Treatments   Labs (all labs ordered are listed, but only abnormal results are displayed) Labs Reviewed  RESP PANEL BY RT-PCR (RSV, FLU A&B, COVID)  [id number] - Abnormal; Notable for the following components:      Result Value   Resp Syncytial Virus by PCR POSITIVE (*)    All other components within normal limits     EKG     RADIOLOGY    PROCEDURES:  Critical Care performed: No   Procedures   MEDICATIONS ORDERED IN ED: Medications - No data to display   IMPRESSION / MDM / ASSESSMENT AND PLAN / ED COURSE  I reviewed the triage vital signs and the nursing notes.                              Differential diagnosis includes, but is not limited to, RSV, COVID, influenza  Patient's  presentation is most consistent with acute complicated illness / injury requiring diagnostic workup.  Patient Is a 23 year old female here with cough, nasal congestion for the last 5 days.  She has a sick contact with several people at her daughter's daycare testing positive for RSV.  All of her vital signs are within normal range.  She is well-appearing on exam.  Her lungs are clear to auscultation.  Respiratory panel is positive for RSV.  Discussed symptomatic treatment with her including over-the-counter Tylenol  and ibuprofen  for pain as well as continuing Mucinex  usage for cough and nasal congestion.  She did not need a work note at this time. She can follow-up with her primary care provider as needed.  The patient may return to the emergency department for any new, worsening, or concerning symptoms. Patient was given the opportunity to ask questions; all questions were answered. Emergency department return precautions were discussed with the patient.  Patient is in  agreement to the treatment plan.  Patient is stable for discharge.   FINAL CLINICAL IMPRESSION(S) / ED DIAGNOSES   Final diagnoses:  Infection due to respiratory syncytial virus (RSV), unspecified infection type     Rx / DC Orders   ED Discharge Orders          Ordered    guaiFENesin  (MUCINEX ) 600 MG 12 hr tablet  2 times daily        09/16/24 2017    acetaminophen  (TYLENOL ) 500 MG tablet  Every 8 hours PRN        09/16/24 2017    ibuprofen  (MOTRIN  IB) 200 MG tablet  Every 8 hours PRN        09/16/24 2018             Note:  This document was prepared using Dragon voice recognition software and may include unintentional dictation errors.     Sheron Salm, PA-C 09/16/24 2111    Jacolyn Pae, MD 09/16/24 2209

## 2024-09-23 ENCOUNTER — Encounter: Payer: Self-pay | Admitting: Nurse Practitioner

## 2024-09-23 ENCOUNTER — Ambulatory Visit (INDEPENDENT_AMBULATORY_CARE_PROVIDER_SITE_OTHER): Admitting: Nurse Practitioner

## 2024-09-23 VITALS — BP 120/78 | HR 96 | Temp 97.8°F | Resp 16 | Ht 67.0 in | Wt 253.0 lb

## 2024-09-23 DIAGNOSIS — J351 Hypertrophy of tonsils: Secondary | ICD-10-CM | POA: Diagnosis not present

## 2024-09-23 DIAGNOSIS — M064 Inflammatory polyarthropathy: Secondary | ICD-10-CM | POA: Diagnosis not present

## 2024-09-23 DIAGNOSIS — F39 Unspecified mood [affective] disorder: Secondary | ICD-10-CM

## 2024-09-23 DIAGNOSIS — Z8269 Family history of other diseases of the musculoskeletal system and connective tissue: Secondary | ICD-10-CM | POA: Diagnosis not present

## 2024-09-23 DIAGNOSIS — Z8709 Personal history of other diseases of the respiratory system: Secondary | ICD-10-CM

## 2024-09-23 NOTE — Progress Notes (Unsigned)
 [facility] [address] [place], [place] [address]  Internal MEDICINE  Office Visit Note  Patient Name: Sandy Burns  [id number]  [id number]  Date of Service: 09/23/2024  Chief Complaint  Patient presents with   Follow-up    Eval new med and swollen tonsils     HPI Sandy Burns presents for a follow-up visit for  Enlarged tonsils -- feels like they are always touching, prone to URIs often, and recently had RSV. Interested to being evaluated to have her tonsils removed.   Family history of lupus in mother -- wants to be tested, has multiple joint pains, and has had issues with rashes, as well as a history of asthma and eczema.  Mood swings/bipolar -- Wants to be evaluated for bipolar disorder. She reports mood swings, irritability, alternating depressed mood and irritable mood. She reports that she antagonizes people and tries to instigate fights when she is is a kind of manic state. She has been talking to a telehealth therapist/psychiatrist and all they have done is try to giver her medicine.     Current Medication: Outpatient Encounter Medications as of 09/23/2024  Medication Sig   Adapalene  0.3 % gel Apply pea sized amount to face nightly as tolerated   ADVAIR  DISKUS 100-50 MCG/ACT AEPB INHALE 1 PUFF INTO THE LUNGS TWICE DAILY   albuterol  (VENTOLIN  HFA) 108 (90 Base) MCG/ACT inhaler INHALE 2 PUFFS INTO THE LUNGS EVERY 4 HOURS AS NEEDED FOR WHEEZING OR SHORTNESS OF BREATH   Azelaic Acid  15 % gel After skin is thoroughly washed and patted dry, gently but thoroughly massage a thin film of azelaic acid  cream into the affected area twice daily, in the morning and evening.   BENZACLIN  gel Apply to face daily   cetirizine  (ZYRTEC ) 10 MG tablet Take 10 mg by mouth daily as needed.   Delgocitinib  (ANZUPGO ) 20 MG/GM CREA Apply 1 Application topically daily.   EPIPEN  2-PAK 0.3 MG/0.3ML SOAJ injection Inject 0.3 mg into the muscle as needed for anaphylaxis.   Fluocinolone   Acetonide 0.01 % OIL Apply to itchy scale at scalp twice weekly for seborrheic dermatitis   fluticasone  (FLONASE ) 50 MCG/ACT nasal spray SHAKE LIQUID AND USE 1 SPRAY IN EACH NOSTRIL DAILY   ketoconazole  (NIZORAL ) 2 % shampoo apply 1 time per week, massage into scalp and leave in for 5 minutes before rinsing out   labetalol  (NORMODYNE ) 100 MG tablet Take 1 tablet (100 mg total) by mouth 2 (two) times daily.   levonorgestrel  (MIRENA ) 20 MCG/DAY IUD 1 each by Intrauterine route once.   phentermine  (ADIPEX-P ) 37.5 MG tablet Take 1 tablet (37.5 mg total) by mouth daily before breakfast. May take 1/2 tablet daily for the first week and then increase to the full tablet.   Ruxolitinib Phosphate  (OPZELURA ) 1.5 % CREA APPLY TOPICALLY TO LEGS ONCE DAILY   valACYclovir  (VALTREX ) 500 MG tablet Take 1 tablet (500 mg total) by mouth daily. Can increase to twice a day for 5 days in the event of a recurrence   Vitamin D , Ergocalciferol , (DRISDOL ) 1.25 MG (50000 UNIT) CAPS capsule Take 1 capsule (50,000 Units total) by mouth every 7 (seven) days.   No facility-administered encounter medications on file as of 09/23/2024.    Surgical History: Past Surgical History:  Procedure Laterality Date   FOOT SURGERY  2015   REPAIR VAGINAL CUFF N/A 09/06/2020   Procedure: REPAIR VAGINAL CUFF;  Surgeon: Connell Davies, MD;  Location: [facility] ORS;  Service: Gynecology;  Laterality: N/A;   TOOTH  EXTRACTION  2015   WISDOM TOOTH EXTRACTION     four; age 23    Medical History: Past Medical History:  Diagnosis Date   Anemia    Asthma    Delay 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/07/2020   Eczema    Environmental allergies    History of blood transfusion    after giving birth 09/06/2020   HSV-1 (herpes simplex virus 1) infection    HSV-2 infection    Vaginal vault hematoma 09/07/2020    Family History: Family History  Problem Relation Age of Onset   Rheum arthritis Mother    Hypertension Mother    Lupus Mother     Schizophrenia Father    Healthy Sister    Healthy Sister    Healthy Brother    Healthy Brother    Asthma Maternal Grandmother    Hypertension Maternal Grandmother    Glaucoma Maternal Grandmother    Carpal tunnel syndrome Maternal Grandmother    Healthy Maternal Grandfather     Social History   Socioeconomic History   Marital status: Single    Spouse name: Not on file   Number of children: 2   Years of education: 14   Highest education level: Not on file  Occupational History   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    Comment: Mirena  01/18/24  Other Topics Concern   Not on file  Social History Narrative   Not on file   Social Drivers of Health   Tobacco Use: Low Risk (09/23/2024)   Patient History    Smoking Tobacco Use: Never    Smokeless Tobacco Use: Never    Passive Exposure: Not on file  Financial Resource Strain: Medium Risk (05/05/2023)   Overall Financial Resource Strain (CARDIA)    Difficulty of Paying Living Expenses: Somewhat hard  Food Insecurity: No Food Insecurity (11/30/2023)   Hunger Vital Sign    Worried About Running Out of Food in the Last Year: Never true    Ran Out of Food in the Last Year: Never true  Transportation Needs: No Transportation Needs (11/30/2023)   PRAPARE - Administrator, Civil Service (Medical): No    Lack of Transportation (Non-Medical): No  Physical Activity: Insufficiently Active (05/05/2023)   Exercise Vital Sign    Days of Exercise per Week: 1 day    Minutes of Exercise per Session: 40 min  Stress: No Stress Concern Present (05/05/2023)   [employer] of Occupational Health - Occupational Stress Questionnaire     Feeling of Stress : Not at all  Social Connections: Unknown (05/05/2023)   Social Connection and Isolation Panel    Frequency of Communication with Friends and Family: Once a week    Frequency of Social Gatherings with Friends and Family: Three times a week    Attends Religious Services: Never    Active Member of Clubs or Organizations: No    Attends Banker Meetings: Never    Marital Status: Not on file  Intimate Partner Violence: Not At Risk (11/30/2023)   Humiliation, Afraid, Rape, and Kick questionnaire    Fear of Current or Ex-Partner: No  Emotionally Abused: No    Physically Abused: No    Sexually Abused: No  Depression (PHQ2-9): Medium Risk (06/16/2023)   Depression (PHQ2-9)    PHQ-2 Score: 6  Alcohol Screen: Low Risk (07/12/2022)   Alcohol Screen    Last Alcohol Screening Score (AUDIT): 0  Housing: Low Risk (11/30/2023)   Housing Stability Vital Sign    Unable to Pay for Housing in the Last Year: No    Number of Times Moved in the Last Year: 0    Homeless in the Last Year: No  Utilities: Not At Risk (11/30/2023)   AHC Utilities    Threatened with loss of utilities: No  Health Literacy: Adequate Health Literacy (05/05/2023)   B1300 Health Literacy    Frequency of need for help with medical instructions: Never      Review of Systems  Vital Signs: BP 120/78   Pulse 96   Temp 97.8 F (36.6 C)   Resp 16   Ht 5' 7 (1.702 m)   Wt 253 lb (114.8 kg)   SpO2 97%   BMI 39.63 kg/m    Physical Exam     Assessment/Plan:   General Counseling: Sandy Burns verbalizes understanding of the findings of todays visit and agrees with plan of treatment. I have discussed any further diagnostic evaluation that may be needed or ordered today. We also reviewed her medications today. she has been encouraged to call the office with any questions or concerns that should arise related to todays visit.    Orders Placed This Encounter  Procedures   ANA Direct w/Reflex if  Positive   HLA-B27 antigen   Sed Rate (ESR)   C-reactive protein   CYCLIC CITRUL PEPTIDE ANTIBODY, IGG/IGA   Rheumatoid Factor   Ambulatory referral to Psychiatry   Ambulatory referral to ENT    No orders of the defined types were placed in this encounter.   Return for over due for CPE, please schedule .   Total time spent:*** Minutes Time spent includes review of chart, medications, test results, and follow up plan with the patient.   [place] Controlled Substance Database was reviewed by me.  This patient was seen by Mardy Maxin, FNP-C in collaboration with Dr. Sigrid Bathe as a part of collaborative care agreement.   Tayna Smethurst R. Maxin, MSN, FNP-C Internal medicine

## 2024-09-24 ENCOUNTER — Encounter: Payer: Self-pay | Admitting: Nurse Practitioner

## 2024-09-24 DIAGNOSIS — J351 Hypertrophy of tonsils: Secondary | ICD-10-CM | POA: Insufficient documentation

## 2024-09-24 DIAGNOSIS — Z8269 Family history of other diseases of the musculoskeletal system and connective tissue: Secondary | ICD-10-CM | POA: Insufficient documentation

## 2024-09-25 ENCOUNTER — Telehealth: Payer: Self-pay | Admitting: Nurse Practitioner

## 2024-09-25 NOTE — Telephone Encounter (Signed)
 Otolaryngology referral sent via [facility] to [facility]. Notified patient. Gave patient telephone # [phone]

## 2024-09-26 DIAGNOSIS — J301 Allergic rhinitis due to pollen: Secondary | ICD-10-CM | POA: Diagnosis not present

## 2024-10-07 ENCOUNTER — Encounter: Payer: Self-pay | Admitting: Nurse Practitioner

## 2024-10-09 ENCOUNTER — Telehealth: Payer: Self-pay | Admitting: Nurse Practitioner

## 2024-10-09 NOTE — Telephone Encounter (Signed)
 Emailed patient copy of her immunization record per her request-[name]

## 2024-10-21 ENCOUNTER — Telehealth: Payer: Self-pay | Admitting: Nurse Practitioner

## 2024-10-21 NOTE — Telephone Encounter (Signed)
 Patient lvm during lunch to r/s 10/24/24 appointment. No answer when I returned her call. Left her vm that appointment has been cancelled and to call office to r/s-[name]

## 2024-10-22 LAB — RHEUMATOID FACTOR: Rheumatoid fact SerPl-aCnc: <10 [IU]/mL

## 2024-10-22 LAB — SEDIMENTATION RATE: Sed Rate: 22 mm/h (ref 0–32)

## 2024-10-22 LAB — ANA W/REFLEX IF POSITIVE

## 2024-10-22 LAB — C-REACTIVE PROTEIN: CRP: 3 mg/L (ref 0–10)

## 2024-10-22 LAB — CYCLIC CITRUL PEPTIDE ANTIBODY, IGG/IGA: Cyclic Citrullin Peptide Ab: 23 U — AB (ref 0–19)

## 2024-10-22 LAB — HLA-B27 ANTIGEN

## 2024-10-24 ENCOUNTER — Encounter: Admitting: Nurse Practitioner

## 2024-10-28 ENCOUNTER — Encounter: Payer: Self-pay | Admitting: Nurse Practitioner

## 2024-10-28 ENCOUNTER — Ambulatory Visit: Payer: Self-pay | Admitting: Nurse Practitioner

## 2024-10-28 ENCOUNTER — Ambulatory Visit: Admitting: Nurse Practitioner

## 2024-10-28 VITALS — BP 120/82 | HR 97 | Temp 97.9°F | Resp 16 | Ht 67.0 in | Wt 253.2 lb

## 2024-10-28 DIAGNOSIS — E559 Vitamin D deficiency, unspecified: Secondary | ICD-10-CM | POA: Diagnosis not present

## 2024-10-28 DIAGNOSIS — Z0001 Encounter for general adult medical examination with abnormal findings: Secondary | ICD-10-CM | POA: Diagnosis not present

## 2024-10-28 DIAGNOSIS — Z23 Encounter for immunization: Secondary | ICD-10-CM

## 2024-10-28 DIAGNOSIS — R7303 Prediabetes: Secondary | ICD-10-CM | POA: Diagnosis not present

## 2024-10-28 DIAGNOSIS — E538 Deficiency of other specified B group vitamins: Secondary | ICD-10-CM

## 2024-10-28 DIAGNOSIS — Z113 Encounter for screening for infections with a predominantly sexual mode of transmission: Secondary | ICD-10-CM

## 2024-10-28 DIAGNOSIS — Z119 Encounter for screening for infectious and parasitic diseases, unspecified: Secondary | ICD-10-CM

## 2024-10-28 DIAGNOSIS — E782 Mixed hyperlipidemia: Secondary | ICD-10-CM

## 2024-10-28 DIAGNOSIS — R5382 Chronic fatigue, unspecified: Secondary | ICD-10-CM

## 2024-10-28 DIAGNOSIS — M064 Inflammatory polyarthropathy: Secondary | ICD-10-CM

## 2024-10-28 DIAGNOSIS — Z8269 Family history of other diseases of the musculoskeletal system and connective tissue: Secondary | ICD-10-CM

## 2024-10-28 DIAGNOSIS — M2550 Pain in unspecified joint: Secondary | ICD-10-CM

## 2024-10-28 MED ORDER — VARICELLA VIRUS VACCINE LIVE 1350 PFU/0.5ML IJ SUSR: 0.5000 mL | Freq: Once | INTRAMUSCULAR | 0 refills | Status: AC | PRN

## 2024-10-28 NOTE — Progress Notes (Signed)
 Has 1 autoimmune test that was slightly elevated. I am referring her to rheumatology for further evaluation.

## 2024-10-28 NOTE — Telephone Encounter (Signed)
"  Patient notified in office  "

## 2024-10-28 NOTE — Progress Notes (Signed)
 [facility] [address] [place], [place] [address]  Internal MEDICINE  Office Visit Note  Patient Name: Sandy Burns  [id number]  [id number]  Date of Service: 10/28/2024  Chief Complaint  Patient presents with   Annual Exam    HPI Sandy Burns presents for an annual well visit and physical exam.  Well-appearing 24 y.o. year-old @female @ with ___________  Routine CRC screening: Routine mammogram: DEXA scan: Pap smear: Eye exam and/or foot exam: Labs:  New or worsening pain: Other concerns:    Current Medication: Outpatient Encounter Medications as of 10/28/2024  Medication Sig   varicella virus vaccine live (VARIVAX) 1350 PFU/0.5ML SUSR injection Inject 0.5 mLs into the skin once as needed for up to 1 dose for immunization.   [START ON 12/09/2024] varicella virus vaccine live (VARIVAX) 1350 PFU/0.5ML SUSR injection Inject 0.5 mLs into the skin once as needed for up to 1 dose for immunization.   Adapalene  0.3 % gel Apply pea sized amount to face nightly as tolerated   ADVAIR  DISKUS 100-50 MCG/ACT AEPB INHALE 1 PUFF INTO THE LUNGS TWICE DAILY   albuterol  (VENTOLIN  HFA) 108 (90 Base) MCG/ACT inhaler INHALE 2 PUFFS INTO THE LUNGS EVERY 4 HOURS AS NEEDED FOR WHEEZING OR SHORTNESS OF BREATH   Azelaic Acid  15 % gel After skin is thoroughly washed and patted dry, gently but thoroughly massage a thin film of azelaic acid  cream into the affected area twice daily, in the morning and evening.   BENZACLIN  gel Apply to face daily   cetirizine  (ZYRTEC ) 10 MG tablet Take 10 mg by mouth daily as needed.   Delgocitinib  (ANZUPGO ) 20 MG/GM CREA Apply 1 Application topically daily. (Patient not taking: Reported on 10/28/2024)   EPIPEN  2-PAK 0.3 MG/0.3ML SOAJ injection Inject 0.3 mg into the muscle as needed for anaphylaxis.   Fluocinolone  Acetonide 0.01 % OIL Apply to itchy scale at scalp twice weekly for seborrheic dermatitis   fluticasone  (FLONASE ) 50 MCG/ACT nasal spray SHAKE LIQUID AND USE  1 SPRAY IN EACH NOSTRIL DAILY   ketoconazole  (NIZORAL ) 2 % shampoo apply 1 time per week, massage into scalp and leave in for 5 minutes before rinsing out   labetalol  (NORMODYNE ) 100 MG tablet Take 1 tablet (100 mg total) by mouth 2 (two) times daily. (Patient not taking: Reported on 10/28/2024)   levonorgestrel  (MIRENA ) 20 MCG/DAY IUD 1 each by Intrauterine route once.   phentermine  (ADIPEX-P ) 37.5 MG tablet Take 1 tablet (37.5 mg total) by mouth daily before breakfast. May take 1/2 tablet daily for the first week and then increase to the full tablet.   Ruxolitinib Phosphate  (OPZELURA ) 1.5 % CREA APPLY TOPICALLY TO LEGS ONCE DAILY   valACYclovir  (VALTREX ) 500 MG tablet Take 1 tablet (500 mg total) by mouth daily. Can increase to twice a day for 5 days in the event of a recurrence   Vitamin D , Ergocalciferol , (DRISDOL ) 1.25 MG (50000 UNIT) CAPS capsule Take 1 capsule (50,000 Units total) by mouth every 7 (seven) days. (Patient not taking: Reported on 10/28/2024)   No facility-administered encounter medications on file as of 10/28/2024.    Surgical History: Past Surgical History:  Procedure Laterality Date   FOOT SURGERY  2015   REPAIR VAGINAL CUFF N/A 09/06/2020   Procedure: REPAIR VAGINAL CUFF;  Surgeon: Connell Davies, MD;  Location: [facility] ORS;  Service: Gynecology;  Laterality: N/A;   TOOTH EXTRACTION  2015   WISDOM TOOTH EXTRACTION     four; age 62    Medical History: Past  Medical History:  Diagnosis Date   Anemia    Asthma    Delay 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/07/2020   Eczema    Environmental allergies    History of blood transfusion    after giving birth 09/06/2020   HSV-1 (herpes simplex virus 1) infection    HSV-2 infection    Vaginal vault hematoma 09/07/2020    Family History: Family History  Problem Relation Age of Onset   Rheum arthritis Mother    Hypertension Mother    Lupus Mother    Schizophrenia Father    Healthy Sister    Healthy Sister    Healthy Brother     Healthy Brother    Asthma Maternal Grandmother    Hypertension Maternal Grandmother    Glaucoma Maternal Grandmother    Carpal tunnel syndrome Maternal Grandmother    Healthy Maternal Grandfather     Social History   Socioeconomic History   Marital status: Single    Spouse name: Not on file   Number of children: 2   Years of education: 14   Highest education level: Not on file  Occupational History   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    Comment: Mirena  01/18/24  Other Topics Concern   Not on file  Social History Narrative   Not on file   Social Drivers of Health   Tobacco Use: Low Risk (10/28/2024)   Patient History    Smoking Tobacco Use: Never    Smokeless Tobacco Use: Never    Passive Exposure: Not on file  Financial Resource Strain: Medium Risk (05/05/2023)   Overall Financial Resource Strain (CARDIA)    Difficulty of Paying Living Expenses: Somewhat hard  Food Insecurity: No Food Insecurity (11/30/2023)   Hunger Vital Sign    Worried About Running Out of Food in the Last Year: Never true    Ran Out of Food in the Last Year: Never true  Transportation Needs: No Transportation Needs (11/30/2023)   PRAPARE - Administrator, Civil Service (Medical): No    Lack of Transportation (Non-Medical): No  Physical Activity: Insufficiently Active (05/05/2023)   Exercise Vital Sign    Days of Exercise per Week: 1 day    Minutes of Exercise per Session: 40 min  Stress: No Stress Concern Present (05/05/2023)   [employer] of Occupational Health - Occupational Stress Questionnaire    Feeling of Stress : Not at all  Social Connections: Unknown (05/05/2023)    Social Connection and Isolation Panel    Frequency of Communication with Friends and Family: Once a week    Frequency of Social Gatherings with Friends and Family: Three times a week    Attends Religious Services: Never    Active Member of Clubs or Organizations: No    Attends Banker Meetings: Never    Marital Status: Not on file  Intimate Partner Violence: Not At Risk (11/30/2023)   Humiliation, Afraid, Rape, and Kick questionnaire    Fear of Current or Ex-Partner: No    Emotionally Abused: No    Physically Abused: No    Sexually Abused: No  Depression (PHQ2-9): Low Risk (10/28/2024)  Depression (PHQ2-9)    PHQ-2 Score: 0  Alcohol Screen: Low Risk (07/12/2022)   Alcohol Screen    Last Alcohol Screening Score (AUDIT): 0  Housing: Low Risk (11/30/2023)   Housing Stability Vital Sign    Unable to Pay for Housing in the Last Year: No    Number of Times Moved in the Last Year: 0    Homeless in the Last Year: No  Utilities: Not At Risk (11/30/2023)   AHC Utilities    Threatened with loss of utilities: No  Health Literacy: Adequate Health Literacy (05/05/2023)   B1300 Health Literacy    Frequency of need for help with medical instructions: Never      Review of Systems  Vital Signs: BP 120/82   Pulse 97   Temp 97.9 F (36.6 C)   Resp 16   Ht 5' 7 (1.702 m)   Wt 253 lb 3.2 oz (114.9 kg)   SpO2 95%   BMI 39.66 kg/m    Physical Exam     Assessment/Plan: 1. Screen for STD (sexually transmitted disease) (Primary) - Hepatitis B Surface AntiBODY  2. Need for prophylactic vaccination and inoculation against varicella - varicella virus vaccine live (VARIVAX) 1350 PFU/0.5ML SUSR injection; Inject 0.5 mLs into the skin once as needed for up to 1 dose for immunization.  Dispense: 0.5 mL; Refill: 0 - varicella virus vaccine live (VARIVAX) 1350 PFU/0.5ML SUSR injection; Inject 0.5 mLs into the skin once as needed for up to 1 dose for immunization.  Dispense: 0.5 mL;  Refill: 0  3. Encounter for screening examination for infectious disease - Measles/Mumps/Rubella Immunity  4. Encounter for routine adult health examination with abnormal findings - CBC with Differential/Platelet - CMP14+EGFR - Lipid Profile - B12 and Folate Panel - Vitamin D  (25 hydroxy)  5. Vitamin D  deficiency - CBC with Differential/Platelet - CMP14+EGFR - Lipid Profile - B12 and Folate Panel - Vitamin D  (25 hydroxy)  6. Mixed hyperlipidemia - CBC with Differential/Platelet - CMP14+EGFR - Lipid Profile - B12 and Folate Panel - Vitamin D  (25 hydroxy)  7. B12 deficiency - CBC with Differential/Platelet - CMP14+EGFR - Lipid Profile - B12 and Folate Panel - Vitamin D  (25 hydroxy)  8. Prediabetes - Hgb A1C w/o eAG     General Counseling: Kamron verbalizes understanding of the findings of todays visit and agrees with plan of treatment. I have discussed any further diagnostic evaluation that may be needed or ordered today. We also reviewed her medications today. she has been encouraged to call the office with any questions or concerns that should arise related to todays visit.    Orders Placed This Encounter  Procedures   Hepatitis B Surface AntiBODY   Measles/Mumps/Rubella Immunity   CBC with Differential/Platelet   CMP14+EGFR   Lipid Profile   B12 and Folate Panel   Vitamin D  (25 hydroxy)   Hgb A1C w/o eAG    Meds ordered this encounter  Medications   varicella virus vaccine live (VARIVAX) 1350 PFU/0.5ML SUSR injection    Sig: Inject 0.5 mLs into the skin once as needed for up to 1 dose for immunization.    Dispense:  0.5 mL    Refill:  0    Need vaccination for chicken pox 2 dose series 4-8 weeks apart.   varicella virus vaccine live (VARIVAX) 1350 PFU/0.5ML SUSR injection    Sig: Inject 0.5 mLs into the skin once as needed for up to 1 dose for immunization.    Dispense:  0.5  mL    Refill:  0    Second dose, Need vaccination for chicken pox 2 dose  series 4-8 weeks apart.    Return in about 1 month (around 11/28/2024) for F/U, Labs, Haeley Fordham PCP.   Total time spent:*** Minutes Time spent includes review of chart, medications, test results, and follow up plan with the patient.   [place] Controlled Substance Database was reviewed by me.  This patient was seen by Mardy Maxin, FNP-C in collaboration with Dr. Sigrid Bathe as a part of collaborative care agreement.  Alexandre Lightsey R. Maxin, MSN, FNP-C Internal medicine

## 2024-10-28 NOTE — Telephone Encounter (Signed)
-----   Message from Mardy Maxin, NP sent at 10/28/2024  8:30 AM EST ----- Has 1 autoimmune test that was slightly elevated. I am referring her to rheumatology for further evaluation.

## 2024-10-30 ENCOUNTER — Encounter: Payer: Self-pay | Admitting: Nurse Practitioner

## 2024-11-09 LAB — CMP14+EGFR
ALT: 10 [IU]/L (ref 0–32)
AST: 15 [IU]/L (ref 0–40)
Albumin: 4.3 g/dL (ref 4.0–5.0)
Alkaline Phosphatase: 70 [IU]/L (ref 41–116)
BUN/Creatinine Ratio: 18 (ref 9–23)
BUN: 14 mg/dL (ref 6–20)
Bilirubin Total: 0.5 mg/dL (ref 0.0–1.2)
CO2: 21 mmol/L (ref 20–29)
Calcium: 9.5 mg/dL (ref 8.7–10.2)
Chloride: 104 mmol/L (ref 96–106)
Creatinine, Ser: 0.76 mg/dL (ref 0.57–1.00)
Globulin, Total: 3.2 g/dL (ref 1.5–4.5)
Glucose: 89 mg/dL (ref 70–99)
Potassium: 4.4 mmol/L (ref 3.5–5.2)
Sodium: 138 mmol/L (ref 134–144)
Total Protein: 7.5 g/dL (ref 6.0–8.5)
eGFR: 112 mL/min/{1.73_m2}

## 2024-11-09 LAB — CBC WITH DIFFERENTIAL/PLATELET
Basophils Absolute: 0 10*3/uL (ref 0.0–0.2)
Basos: 1 %
EOS (ABSOLUTE): 0.1 10*3/uL (ref 0.0–0.4)
Eos: 3 %
Hematocrit: 40.6 % (ref 34.0–46.6)
Hemoglobin: 13.3 g/dL (ref 11.1–15.9)
Immature Grans (Abs): 0 10*3/uL (ref 0.0–0.1)
Immature Granulocytes: 0 %
Lymphocytes Absolute: 1.9 10*3/uL (ref 0.7–3.1)
Lymphs: 47 %
MCH: 30.6 pg (ref 26.6–33.0)
MCHC: 32.8 g/dL (ref 31.5–35.7)
MCV: 93 fL (ref 79–97)
Monocytes Absolute: 0.4 10*3/uL (ref 0.1–0.9)
Monocytes: 10 %
Neutrophils Absolute: 1.6 10*3/uL (ref 1.4–7.0)
Neutrophils: 39 %
Platelets: 322 10*3/uL (ref 150–450)
RBC: 4.35 x10E6/uL (ref 3.77–5.28)
RDW: 11.8 % (ref 11.7–15.4)
WBC: 4 10*3/uL (ref 3.4–10.8)

## 2024-11-09 LAB — LIPID PANEL
Chol/HDL Ratio: 3.5 ratio (ref 0.0–4.4)
Cholesterol, Total: 202 mg/dL — ABNORMAL HIGH (ref 100–199)
HDL: 58 mg/dL
LDL Chol Calc (NIH): 135 mg/dL — ABNORMAL HIGH (ref 0–99)
Triglycerides: 47 mg/dL (ref 0–149)
VLDL Cholesterol Cal: 9 mg/dL (ref 5–40)

## 2024-11-09 LAB — MEASLES/MUMPS/RUBELLA IMMUNITY
MUMPS ABS, IGG: 67.2 [AU]/ml
RUBEOLA AB, IGG: 84.8 [AU]/ml
Rubella Antibodies, IGG: 1.82 {index}

## 2024-11-09 LAB — HGB A1C W/O EAG: Hgb A1c MFr Bld: 5.7 % — ABNORMAL HIGH (ref 4.8–5.6)

## 2024-11-09 LAB — VITAMIN D 25 HYDROXY (VIT D DEFICIENCY, FRACTURES): Vit D, 25-Hydroxy: 55.5 ng/mL (ref 30.0–100.0)

## 2024-11-09 LAB — B12 AND FOLATE PANEL
Folate: 19.1 ng/mL
Vitamin B-12: 694 pg/mL (ref 232–1245)

## 2024-11-09 LAB — HEPATITIS B SURFACE ANTIBODY,QUALITATIVE: Hep B Surface Ab, Qual: NONREACTIVE

## 2024-11-13 ENCOUNTER — Ambulatory Visit: Payer: Self-pay | Admitting: Nurse Practitioner

## 2024-11-13 NOTE — Progress Notes (Signed)
 We will discuss lab results at her upcoming office visit.

## 2024-11-21 ENCOUNTER — Ambulatory Visit: Admitting: Psychiatry

## 2024-11-28 ENCOUNTER — Ambulatory Visit: Admitting: Nurse Practitioner

## 2024-12-25 ENCOUNTER — Ambulatory Visit: Admitting: Licensed Practical Nurse

## 2024-12-26 ENCOUNTER — Ambulatory Visit: Payer: Self-pay | Admitting: Physician Assistant

## 2025-02-10 ENCOUNTER — Ambulatory Visit: Admitting: Dermatology

## 2025-02-19 ENCOUNTER — Encounter: Admitting: Internal Medicine

## 2025-03-18 ENCOUNTER — Ambulatory Visit: Admitting: Internal Medicine

## 2025-10-29 ENCOUNTER — Encounter: Admitting: Nurse Practitioner
